# Patient Record
Sex: Male | Born: 1980 | Race: White | Hispanic: No | Marital: Single | Smoking: Never smoker
Health system: Southern US, Community
[De-identification: ages and names within clinical notes are randomized; demographics above are authoritative.]

## PROBLEM LIST (undated history)

## (undated) DIAGNOSIS — F840 Autistic disorder: Secondary | ICD-10-CM

## (undated) DIAGNOSIS — Q909 Down syndrome, unspecified: Secondary | ICD-10-CM

## (undated) DIAGNOSIS — Q213 Tetralogy of Fallot: Secondary | ICD-10-CM

## (undated) HISTORY — PX: TETRALOGY OF FALLOT REPAIR: SHX796

---

## 2017-12-10 ENCOUNTER — Other Ambulatory Visit: Payer: Self-pay

## 2017-12-10 ENCOUNTER — Emergency Department
Admission: EM | Admit: 2017-12-10 | Discharge: 2017-12-10 | Disposition: A | Discharge status: Home / Self Care | Payer: Medicare Other | Attending: Emergency Medicine | Admitting: Emergency Medicine

## 2017-12-10 DIAGNOSIS — R4689 Other symptoms and signs involving appearance and behavior: Secondary | ICD-10-CM | POA: Insufficient documentation

## 2017-12-10 DIAGNOSIS — Z79899 Other long term (current) drug therapy: Secondary | ICD-10-CM | POA: Insufficient documentation

## 2017-12-10 DIAGNOSIS — R109 Unspecified abdominal pain: Secondary | ICD-10-CM

## 2017-12-10 DIAGNOSIS — R1013 Epigastric pain: Secondary | ICD-10-CM | POA: Insufficient documentation

## 2017-12-10 HISTORY — DX: Autistic disorder: F84.0

## 2017-12-10 HISTORY — DX: Down syndrome, unspecified: Q90.9

## 2017-12-10 LAB — COMPREHENSIVE METABOLIC PANEL
ALT: 17 U/L (ref 0–44)
AST: 28 U/L (ref 15–41)
Albumin: 3.9 g/dL (ref 3.5–5.0)
Alkaline Phosphatase: 106 U/L (ref 38–126)
Anion gap: 6 (ref 5–15)
BUN: 13 mg/dL (ref 6–20)
CO2: 26 mmol/L (ref 22–32)
CREATININE: 0.88 mg/dL (ref 0.61–1.24)
Calcium: 9.1 mg/dL (ref 8.9–10.3)
Chloride: 106 mmol/L (ref 98–111)
GFR calc Af Amer: >60 mL/min (ref >60)
Glucose, Bld: 109 mg/dL — ABNORMAL HIGH (ref 70–99)
Potassium: 3.7 mmol/L (ref 3.5–5.1)
Sodium: 138 mmol/L (ref 135–145)
Total Bilirubin: 0.9 mg/dL (ref 0.3–1.2)
Total Protein: 7.3 g/dL (ref 6.5–8.1)

## 2017-12-10 LAB — CBC
HCT: 40.3 % (ref 40.0–52.0)
Hemoglobin: 14.2 g/dL (ref 13.0–18.0)
MCH: 37.2 pg — AB (ref 26.0–34.0)
MCHC: 35.1 g/dL (ref 32.0–36.0)
MCV: 106.1 fL — AB (ref 80.0–100.0)
PLATELETS: 136 10*3/uL — AB (ref 150–440)
RBC: 3.8 MIL/uL — AB (ref 4.40–5.90)
RDW: 15.8 % — ABNORMAL HIGH (ref 11.5–14.5)
WBC: 6.2 10*3/uL (ref 3.8–10.6)

## 2017-12-10 LAB — ETHANOL

## 2017-12-10 LAB — ACETAMINOPHEN LEVEL: Acetaminophen (Tylenol), Serum: <10 ug/mL — ABNORMAL LOW (ref 10–30)

## 2017-12-10 LAB — SALICYLATE LEVEL: Salicylate Lvl: <7 mg/dL (ref 2.8–30.0)

## 2017-12-10 LAB — TROPONIN I: Troponin I: <0.03 ng/mL (ref <0.03)

## 2017-12-10 MED ORDER — HYDROCODONE-ACETAMINOPHEN 5-325 MG PO TABS
2.0000 | ORAL_TABLET | Freq: Once | ORAL | Status: AC
  Administered 2017-12-10: 2 via ORAL
  Filled 2017-12-10: qty 2

## 2017-12-10 MED ORDER — ALUM & MAG HYDROXIDE-SIMETH 200-200-20 MG/5ML PO SUSP
30.0000 mL | Freq: Once | ORAL | Status: AC
  Administered 2017-12-10: 30 mL via ORAL
  Filled 2017-12-10: qty 30
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
## 2017-12-10 NOTE — ED Notes (Signed)
A representative from [facility](Britney) came to check on patient.  Britney also gave medication list for patient.

## 2017-12-10 NOTE — ED Notes (Signed)
Pt. Is from [facility] in [place]
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
## 2017-12-10 NOTE — Discharge Instructions (Signed)
We believe that Derrick Marshall's behavior is likely due to an upset stomach that is making him uncomfortable and causing him to act out.  His workup was reassuring today and we do not recommend any medication changes at this time.  Please give him his regular afternoon / evening medications tonight and follow up with his regular doctor and/or psychiatrist at the next available opportunity.  Return to the emergency department if you develop new or worsening symptoms that concern you.

## 2017-12-10 NOTE — ED Notes (Signed)
Pt. Denies chest pain, pt. States "belly pain".  Pt. Trying to use bathroom to have a bowel movement.
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
## 2017-12-11 ENCOUNTER — Encounter: Payer: Self-pay | Admitting: Emergency Medicine

## 2017-12-11 ENCOUNTER — Other Ambulatory Visit: Payer: Self-pay

## 2017-12-11 ENCOUNTER — Emergency Department
Admission: EM | Admit: 2017-12-11 | Discharge: 2017-12-13 | Disposition: A | Discharge status: Home / Self Care | Payer: Medicare Other | Attending: Emergency Medicine | Admitting: Emergency Medicine

## 2017-12-11 DIAGNOSIS — Z79899 Other long term (current) drug therapy: Secondary | ICD-10-CM | POA: Diagnosis not present

## 2017-12-11 DIAGNOSIS — R451 Restlessness and agitation: Secondary | ICD-10-CM | POA: Diagnosis not present

## 2017-12-11 DIAGNOSIS — R456 Violent behavior: Secondary | ICD-10-CM | POA: Diagnosis present

## 2017-12-11 DIAGNOSIS — R4689 Other symptoms and signs involving appearance and behavior: Secondary | ICD-10-CM

## 2017-12-11 DIAGNOSIS — Q909 Down syndrome, unspecified: Secondary | ICD-10-CM | POA: Insufficient documentation

## 2017-12-11 DIAGNOSIS — F71 Moderate intellectual disabilities: Secondary | ICD-10-CM

## 2017-12-11 DIAGNOSIS — F840 Autistic disorder: Secondary | ICD-10-CM
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
## 2017-12-12 DIAGNOSIS — R451 Restlessness and agitation: Secondary | ICD-10-CM | POA: Diagnosis not present

## 2017-12-12 DIAGNOSIS — F71 Moderate intellectual disabilities: Secondary | ICD-10-CM

## 2017-12-12 DIAGNOSIS — F840 Autistic disorder: Secondary | ICD-10-CM | POA: Diagnosis not present

## 2017-12-12 LAB — COMPREHENSIVE METABOLIC PANEL
ALBUMIN: 3.9 g/dL (ref 3.5–5.0)
ALK PHOS: 111 U/L (ref 38–126)
ALT: 18 U/L (ref 0–44)
AST: 33 U/L (ref 15–41)
Anion gap: 8 (ref 5–15)
BILIRUBIN TOTAL: 0.8 mg/dL (ref 0.3–1.2)
BUN: 12 mg/dL (ref 6–20)
CALCIUM: 9.1 mg/dL (ref 8.9–10.3)
CO2: 28 mmol/L (ref 22–32)
CREATININE: 0.91 mg/dL (ref 0.61–1.24)
Chloride: 104 mmol/L (ref 98–111)
GFR calc Af Amer: >60 mL/min (ref >60)
GFR calc non Af Amer: >60 mL/min (ref >60)
GLUCOSE: 130 mg/dL — AB (ref 70–99)
POTASSIUM: 4 mmol/L (ref 3.5–5.1)
Sodium: 140 mmol/L (ref 135–145)
TOTAL PROTEIN: 7.5 g/dL (ref 6.5–8.1)

## 2017-12-12 LAB — CBC
HCT: 42.1 % (ref 40.0–52.0)
Hemoglobin: 14.4 g/dL (ref 13.0–18.0)
MCH: 36.5 pg — ABNORMAL HIGH (ref 26.0–34.0)
MCHC: 34.3 g/dL (ref 32.0–36.0)
MCV: 106.4 fL — ABNORMAL HIGH (ref 80.0–100.0)
Platelets: 141 10*3/uL — ABNORMAL LOW (ref 150–440)
RBC: 3.96 MIL/uL — ABNORMAL LOW (ref 4.40–5.90)
RDW: 16.3 % — AB (ref 11.5–14.5)
WBC: 3.7 10*3/uL — AB (ref 3.8–10.6)

## 2017-12-12 LAB — ACETAMINOPHEN LEVEL

## 2017-12-12 LAB — ETHANOL: Alcohol, Ethyl (B): <10 mg/dL (ref <10)

## 2017-12-12 LAB — SALICYLATE LEVEL

## 2017-12-12 MED ORDER — VITAMIN B-12 1000 MCG PO TABS
1000.0000 ug | ORAL_TABLET | Freq: Two times a day (BID) | ORAL | Status: DC
  Administered 2017-12-12 – 2017-12-13 (×3): 1000 ug via ORAL
  Filled 2017-12-12 (×3): qty 1

## 2017-12-12 MED ORDER — MICONAZOLE NITRATE POWD
Freq: Two times a day (BID) | Status: DC
  Filled 2017-12-12: qty 100

## 2017-12-12 MED ORDER — HALOPERIDOL 0.5 MG PO TABS
3.0000 mg | ORAL_TABLET | Freq: Two times a day (BID) | ORAL | Status: DC
  Administered 2017-12-12 – 2017-12-13 (×3): 3 mg via ORAL
  Filled 2017-12-12 (×3): qty 6

## 2017-12-12 MED ORDER — BENZTROPINE MESYLATE 1 MG PO TABS
1.0000 mg | ORAL_TABLET | Freq: Two times a day (BID) | ORAL | Status: DC
  Administered 2017-12-12 – 2017-12-13 (×3): 1 mg via ORAL
  Filled 2017-12-12 (×3): qty 1

## 2017-12-12 MED ORDER — NYSTATIN 100000 UNIT/GM EX POWD
Freq: Two times a day (BID) | CUTANEOUS | Status: DC
Start: 2017-12-12 — End: 2017-12-13
  Administered 2017-12-12: 17:00:00 via TOPICAL
  Filled 2017-12-12: qty 15

## 2017-12-12 MED ORDER — FAMOTIDINE 20 MG PO TABS
20.0000 mg | ORAL_TABLET | Freq: Two times a day (BID) | ORAL | Status: DC
  Administered 2017-12-12 – 2017-12-13 (×3): 20 mg via ORAL
  Filled 2017-12-12 (×3): qty 1

## 2017-12-12 MED ORDER — PROSIGHT PO TABS
1.0000 | ORAL_TABLET | Freq: Every day | ORAL | Status: DC
  Filled 2017-12-12: qty 1

## 2017-12-12 MED ORDER — ZIPRASIDONE MESYLATE 20 MG IM SOLR
INTRAMUSCULAR | Status: AC
  Administered 2017-12-12: 10 mg
  Filled 2017-12-12: qty 20

## 2017-12-12 MED ORDER — OCUVITE-LUTEIN PO CAPS
1.0000 | ORAL_CAPSULE | Freq: Every day | ORAL | Status: DC
  Administered 2017-12-12 – 2017-12-13 (×2): 1 via ORAL
  Filled 2017-12-12 (×2): qty 1

## 2017-12-12 MED ORDER — VITAMIN D (ERGOCALCIFEROL) 1.25 MG (50000 UNIT) PO CAPS
50000.0000 [IU] | ORAL_CAPSULE | ORAL | Status: DC
  Administered 2017-12-13: 50000 [IU] via ORAL
  Filled 2017-12-12: qty 1

## 2017-12-12 MED ORDER — HALOPERIDOL LACTATE 5 MG/ML IJ SOLN: 5.0000 mg | Freq: Four times a day (QID) | INTRAMUSCULAR | Status: DC | PRN

## 2017-12-12 MED ORDER — DIAZEPAM 5 MG PO TABS: 5.0000 mg | ORAL_TABLET | Freq: Every day | ORAL | 0 refills | Status: DC

## 2017-12-12 NOTE — ED Notes (Signed)
IVC / Consult completed/ Pending Placement 

## 2017-12-12 NOTE — ED Notes (Signed)
Patient's sister / guardian Bonita Quin(Linda) came to visit.  Ms. Bonita QuinLinda had many requests regarding patient's care and medications. Specifically, Bonita QuinLinda stated patient can not receive ANY new medications or changes in doses without notifying her first. Dr. Toni Amendlapacs speaking with guardian now.   Patient is dayroom, sporadically yelling.  Maintained on 15 minute checks and observation by security camera for safety.

## 2017-12-12 NOTE — ED Notes (Signed)
Hourly rounding reveals patient in room. No complaints, stable, in no acute distress. Q15 minute rounds and monitoring via Security Cameras to continue. 

## 2017-12-12 NOTE — ED Notes (Signed)
Report to include Situation, Background, Assessment, and Recommendations received from Amy B. RN. Patient alert, warm and dry, in no acute distress. Patient difficult to understand and yells out intermittently. Patient made aware of Q15 minute rounds and security cameras for their safety. Patient instructed to come to me with needs or concerns.

## 2017-12-12 NOTE — ED Notes (Signed)
[facility]

## 2017-12-12 NOTE — ED Notes (Signed)
Hourly rounding reveals patient sitting on bed in room. No complaints, stable, in no acute distress. Q15 minute rounds and monitoring via [employer] Cam[employer] to continue.

## 2017-12-12 NOTE — Consult Note (Signed)
  Psychiatry: Updated note.  Since earlier note the group home has agreed to take the patient back and the sister has agreed to the plan.  Both of them requested that we add an additional medicine that the patient can take in the afternoon for anxiety and agitation.  This suggested Valium.  I have written a prescription for Valium 5 mg every afternoon 30 days that can be given to the patient.  I am told that under the circumstances he is fine to go back to the [facility] facility in the morning.  Discontinue IVC.

## 2017-12-12 NOTE — ED Notes (Signed)
Pt in dayroom. Needing frequent re-direction. Maintained on 15 minute checks and observation by security camera for safety.
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
## 2017-12-12 NOTE — ED Notes (Signed)
Hourly rounding reveals patient sleeping in room. No complaints, stable, in no acute distress. Q15 minute rounds and monitoring via Security to continue. 

## 2017-12-12 NOTE — ED Notes (Signed)
Patient was transferred from the quad, Patient is in the dayroom, talks to self and talks loudly at times, but He is not aggressive, but redirectable, and calm, will continue to monitor.
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
## 2017-12-12 NOTE — ED Notes (Signed)
Pt observed on camera smearing poop on the wall in bathroom. Tech and RN approached patient telling him that behavior was not acceptable. Patient began cursing at staff. After a couple of minutes patient stated he was sorry and asked to take a shower. Tech assisted patient with shower.  Patient continually asking to go to the group home.   Maintained on 15 minute checks and observation by security camera for safety.

## 2017-12-12 NOTE — ED Notes (Signed)
Patient has been loud; attempting to take soap disspencer several times;; redirection provided; disruptive behavior continues; ER MD made aware.

## 2017-12-12 NOTE — ED Notes (Addendum)
Patient's guardian Derrick Quin(Linda) called stating the patient can return to his group home if given a prescription for a daily sedative.  Dr. Toni Amendlapacs notified and prescription written for valium.   Legal guardian:  Derrick Marshall [phone])

## 2017-12-12 NOTE — ED Notes (Signed)
Patient allowed Transport plannerwriter and Technician to draw his blood, to collect lab work, patient was appropriate and cooperative. NAD noted
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
## 2017-12-13 DIAGNOSIS — R451 Restlessness and agitation: Secondary | ICD-10-CM | POA: Diagnosis not present

## 2017-12-13 MED ORDER — LOPERAMIDE HCL 2 MG PO CAPS
4.0000 mg | ORAL_CAPSULE | Freq: Once | ORAL | Status: AC
  Administered 2017-12-13: 4 mg via ORAL
  Filled 2017-12-13: qty 2

## 2017-12-13 NOTE — Discharge Instructions (Signed)
He does not have intermittent explosive disorder but the instructions for this problem may help.  Please continue all of his medicines.  Please follow-up with his doctors.  Please return for any further problems.

## 2017-12-13 NOTE — ED Notes (Signed)
Hourly rounding reveals patient sleeping in room. No complaints, stable, in no acute distress. Q15 minute rounds and monitoring via Security Cameras to continue. 

## 2017-12-13 NOTE — ED Notes (Signed)
Spoke with [facility] person(assistant director of [employer]), to let her know patient was ready for discharge, she informed me that the group home staff were having a meeting today at 10a to make sure [facility] a good fit for patient, as he has only been there a little while, and has been to [facility] twice. Mrs. Derrick Marshall said she will call me back to keep me updated after the meeting and she also wanted to make sure the prescription for medication was written.

## 2017-12-13 NOTE — ED Notes (Signed)
Hourly rounding reveals patient in room. No complaints, stable, in no acute distress. Q15 minute rounds and monitoring via Security Cameras to continue. 

## 2017-12-13 NOTE — ED Notes (Addendum)
Patient discharged back to [facility] and was picked up by Patrecia Pourhonda Enoch. Mrs. Anselm LisEnoch signed for the Valium 5mg  prescription, and for the after visit summary. Patient was really glad to see her, and was happy.   Patient discharged to group home, patient received discharge papers and prescription for Valium. Patient received belongings. Patient appropriate and cooperative, Denies SI/HI AVH. Vital signs taken. NAD noted.

## 2017-12-13 NOTE — ED Notes (Addendum)
Attempted to call [facility] to inform them, that patient is ready to be discharged, no one was available [phone]

## 2017-12-13 NOTE — ED Notes (Signed)
BEHAVIORAL HEALTH ROUNDING Patient sleeping: No. Patient alert and oriented: yes Behavior appropriate: Yes.  ; If no, describe:  Nutrition and fluids offered: yes Toileting and hygiene offered: Yes  Sitter present: q15 minute observations and security monitoring Law enforcement present: Yes    

## 2017-12-13 NOTE — Clinical Social Work Note (Signed)
CSW received a consult for "Urgently needs disposition to another, safer and more appropriate site." CSW review notes that patient is able to return to [facility]. CSW confirmed with [facility] RN Geralynn OchsJadeka. CSW faxed patient's prescription to facility. Facility to pick up patient at 12pm. CSW signing off as no further Social Work needs identified.   Corlis HoveJeneya Maxi Carreras, Theresia MajorsLCSWA, [facility] Clinical Social Worker-Emergency Department [phone]

## 2017-12-13 NOTE — ED Notes (Signed)
Hourly rounding reveals patient in rest room. C/O diarrhea, stable, in no acute distress. Q15 minute rounds and monitoring via [employer] to continue.

## 2017-12-13 NOTE — ED Provider Notes (Signed)
-----------------------------------------   4:49 AM on 12/13/2017 -----------------------------------------   Blood pressure 105/66, pulse 91, temperature 98.5 F (36.9 C), temperature source Oral, resp. rate 14, height 5\' 1"  (1.549 m), weight 68 kg, SpO2 97 %.  The patient had no acute events since last update.  Calm and cooperative at this time.  Disposition is pending Psychiatry/Behavioral Medicine team recommendations.     Merrily Brittleifenbark, Simonne Boulos, MD 12/13/17 [phone]

## 2017-12-17 ENCOUNTER — Inpatient Hospital Stay
Admission: EM | Admit: 2017-12-17 | Discharge: 2018-02-02 | DRG: 871 | Disposition: A | Discharge status: Home / Self Care | Payer: Medicare Other | Attending: Internal Medicine | Admitting: Internal Medicine

## 2017-12-17 ENCOUNTER — Encounter: Payer: Self-pay | Admitting: Emergency Medicine

## 2017-12-17 ENCOUNTER — Other Ambulatory Visit: Payer: Self-pay

## 2017-12-17 DIAGNOSIS — J189 Pneumonia, unspecified organism: Secondary | ICD-10-CM

## 2017-12-17 DIAGNOSIS — K5900 Constipation, unspecified: Secondary | ICD-10-CM | POA: Diagnosis not present

## 2017-12-17 DIAGNOSIS — F71 Moderate intellectual disabilities: Secondary | ICD-10-CM | POA: Diagnosis present

## 2017-12-17 DIAGNOSIS — R451 Restlessness and agitation: Secondary | ICD-10-CM | POA: Diagnosis present

## 2017-12-17 DIAGNOSIS — A419 Sepsis, unspecified organism: Principal | ICD-10-CM | POA: Diagnosis present

## 2017-12-17 DIAGNOSIS — J9602 Acute respiratory failure with hypercapnia: Secondary | ICD-10-CM | POA: Diagnosis present

## 2017-12-17 DIAGNOSIS — R0603 Acute respiratory distress: Secondary | ICD-10-CM

## 2017-12-17 DIAGNOSIS — I959 Hypotension, unspecified: Secondary | ICD-10-CM

## 2017-12-17 DIAGNOSIS — R52 Pain, unspecified: Secondary | ICD-10-CM

## 2017-12-17 DIAGNOSIS — F840 Autistic disorder: Secondary | ICD-10-CM | POA: Diagnosis not present

## 2017-12-17 DIAGNOSIS — F72 Severe intellectual disabilities: Secondary | ICD-10-CM | POA: Diagnosis present

## 2017-12-17 DIAGNOSIS — Q909 Down syndrome, unspecified: Secondary | ICD-10-CM

## 2017-12-17 DIAGNOSIS — N170 Acute kidney failure with tubular necrosis: Secondary | ICD-10-CM | POA: Diagnosis not present

## 2017-12-17 DIAGNOSIS — I639 Cerebral infarction, unspecified: Secondary | ICD-10-CM

## 2017-12-17 DIAGNOSIS — Z79899 Other long term (current) drug therapy: Secondary | ICD-10-CM

## 2017-12-17 DIAGNOSIS — R6521 Severe sepsis with septic shock: Secondary | ICD-10-CM | POA: Diagnosis present

## 2017-12-17 DIAGNOSIS — J9601 Acute respiratory failure with hypoxia: Secondary | ICD-10-CM | POA: Diagnosis present

## 2017-12-17 DIAGNOSIS — G9340 Encephalopathy, unspecified: Secondary | ICD-10-CM | POA: Diagnosis present

## 2017-12-17 DIAGNOSIS — J9600 Acute respiratory failure, unspecified whether with hypoxia or hypercapnia: Secondary | ICD-10-CM

## 2017-12-17 DIAGNOSIS — Z888 Allergy status to other drugs, medicaments and biological substances status: Secondary | ICD-10-CM

## 2017-12-17 DIAGNOSIS — R4182 Altered mental status, unspecified: Secondary | ICD-10-CM

## 2017-12-17 DIAGNOSIS — J690 Pneumonitis due to inhalation of food and vomit: Secondary | ICD-10-CM | POA: Diagnosis present

## 2017-12-17 DIAGNOSIS — D696 Thrombocytopenia, unspecified: Secondary | ICD-10-CM | POA: Diagnosis present

## 2017-12-17 DIAGNOSIS — Z23 Encounter for immunization: Secondary | ICD-10-CM

## 2017-12-17 DIAGNOSIS — R739 Hyperglycemia, unspecified: Secondary | ICD-10-CM | POA: Diagnosis present

## 2017-12-17 DIAGNOSIS — H109 Unspecified conjunctivitis: Secondary | ICD-10-CM | POA: Diagnosis not present

## 2017-12-17 DIAGNOSIS — I248 Other forms of acute ischemic heart disease: Secondary | ICD-10-CM | POA: Diagnosis present

## 2017-12-17 DIAGNOSIS — N472 Paraphimosis: Secondary | ICD-10-CM

## 2017-12-17 DIAGNOSIS — R456 Violent behavior: Secondary | ICD-10-CM | POA: Diagnosis not present

## 2017-12-17 DIAGNOSIS — E872 Acidosis: Secondary | ICD-10-CM | POA: Diagnosis present

## 2017-12-17 HISTORY — DX: Tetralogy of Fallot: Q21.3

## 2017-12-17 LAB — CBC
HCT: 39.1 % — ABNORMAL LOW (ref 40.0–52.0)
HEMOGLOBIN: 13.6 g/dL (ref 13.0–18.0)
MCH: 37.1 pg — AB (ref 26.0–34.0)
MCHC: 34.7 g/dL (ref 32.0–36.0)
MCV: 107.1 fL — ABNORMAL HIGH (ref 80.0–100.0)
Platelets: 127 10*3/uL — ABNORMAL LOW (ref 150–440)
RBC: 3.65 MIL/uL — AB (ref 4.40–5.90)
RDW: 15.8 % — ABNORMAL HIGH (ref 11.5–14.5)
WBC: 4.5 10*3/uL (ref 3.8–10.6)

## 2017-12-17 LAB — URINE DRUG SCREEN, QUALITATIVE (ARMC ONLY)
AMPHETAMINES, UR SCREEN: NOT DETECTED
Barbiturates, Ur Screen: NOT DETECTED
COCAINE METABOLITE, UR ~~LOC~~: NOT DETECTED
Cannabinoid 50 Ng, Ur ~~LOC~~: NOT DETECTED
MDMA (Ecstasy)Ur Screen: NOT DETECTED
METHADONE SCREEN, URINE: NOT DETECTED
OPIATE, UR SCREEN: NOT DETECTED
PHENCYCLIDINE (PCP) UR S: NOT DETECTED
Tricyclic, Ur Screen: NOT DETECTED

## 2017-12-17 LAB — COMPREHENSIVE METABOLIC PANEL
ALK PHOS: 106 U/L (ref 38–126)
ALT: 17 U/L (ref 0–44)
AST: 27 U/L (ref 15–41)
Albumin: 3.7 g/dL (ref 3.5–5.0)
Anion gap: 6 (ref 5–15)
BUN: 12 mg/dL (ref 6–20)
CALCIUM: 9 mg/dL (ref 8.9–10.3)
CO2: 26 mmol/L (ref 22–32)
CREATININE: 0.87 mg/dL (ref 0.61–1.24)
Chloride: 106 mmol/L (ref 98–111)
Glucose, Bld: 107 mg/dL — ABNORMAL HIGH (ref 70–99)
Potassium: 3.8 mmol/L (ref 3.5–5.1)
Sodium: 138 mmol/L (ref 135–145)
Total Bilirubin: 0.4 mg/dL (ref 0.3–1.2)
Total Protein: 7 g/dL (ref 6.5–8.1)

## 2017-12-17 LAB — URINALYSIS, ROUTINE W REFLEX MICROSCOPIC
Bilirubin Urine: NEGATIVE
GLUCOSE, UA: NEGATIVE mg/dL
Hgb urine dipstick: NEGATIVE
Ketones, ur: NEGATIVE mg/dL
LEUKOCYTES UA: NEGATIVE
Nitrite: NEGATIVE
PH: 6 (ref 5.0–8.0)
PROTEIN: NEGATIVE mg/dL
Specific Gravity, Urine: 1.011 (ref 1.005–1.030)

## 2017-12-17 LAB — ACETAMINOPHEN LEVEL

## 2017-12-17 LAB — ETHANOL

## 2017-12-17 LAB — SALICYLATE LEVEL: Salicylate Lvl: <7 mg/dL (ref 2.8–30.0)

## 2017-12-17 MED ORDER — FAMOTIDINE 20 MG PO TABS
20.0000 mg | ORAL_TABLET | Freq: Two times a day (BID) | ORAL | Status: DC
  Administered 2017-12-18 – 2018-01-21 (×49): 20 mg via ORAL
  Filled 2017-12-17 (×50): qty 1

## 2017-12-17 MED ORDER — HALOPERIDOL 2 MG PO TABS
3.0000 mg | ORAL_TABLET | Freq: Two times a day (BID) | ORAL | Status: DC
  Administered 2017-12-18 – 2017-12-22 (×10): 3 mg via ORAL
  Filled 2017-12-17 (×13): qty 1

## 2017-12-17 MED ORDER — BENZTROPINE MESYLATE 1 MG PO TABS
1.0000 mg | ORAL_TABLET | Freq: Two times a day (BID) | ORAL | Status: DC
  Administered 2017-12-18 – 2018-01-26 (×77): 1 mg via ORAL
  Filled 2017-12-17 (×79): qty 1

## 2017-12-17 MED ORDER — LORAZEPAM 2 MG/ML IJ SOLN
2.0000 mg | Freq: Once | INTRAMUSCULAR | Status: AC
  Administered 2018-01-04: 2 mg via INTRAMUSCULAR
  Filled 2017-12-17: qty 1

## 2017-12-17 MED ORDER — ZIPRASIDONE MESYLATE 20 MG IM SOLR
INTRAMUSCULAR | Status: AC
  Administered 2017-12-17: 20 mg
  Filled 2017-12-17: qty 20

## 2017-12-17 MED ORDER — DOCUSATE SODIUM 100 MG PO CAPS
100.0000 mg | ORAL_CAPSULE | Freq: Two times a day (BID) | ORAL | Status: DC
  Administered 2017-12-18 – 2018-01-21 (×67): 100 mg via ORAL
  Filled 2017-12-17 (×69): qty 1

## 2017-12-17 MED ORDER — HALOPERIDOL LACTATE 5 MG/ML IJ SOLN
INTRAMUSCULAR | Status: AC
  Administered 2018-01-09: 5 mg via INTRAMUSCULAR
  Filled 2017-12-17: qty 1

## 2017-12-17 MED ORDER — DIAZEPAM 5 MG PO TABS
5.0000 mg | ORAL_TABLET | Freq: Every day | ORAL | Status: DC
  Administered 2017-12-18 – 2017-12-22 (×5): 5 mg via ORAL
  Filled 2017-12-17 (×5): qty 1

## 2017-12-17 MED ORDER — HALOPERIDOL LACTATE 5 MG/ML IJ SOLN
5.0000 mg | Freq: Once | INTRAMUSCULAR | Status: AC
  Administered 2018-01-09: 5 mg via INTRAMUSCULAR
  Filled 2017-12-17: qty 1

## 2017-12-17 MED ORDER — ZIPRASIDONE MESYLATE 20 MG IM SOLR
20.0000 mg | Freq: Once | INTRAMUSCULAR | Status: AC
  Administered 2017-12-21: 20 mg via INTRAMUSCULAR
  Filled 2017-12-17: qty 20

## 2017-12-17 NOTE — BH Assessment (Signed)
Unable to assess, pt asleep at this time.

## 2017-12-17 NOTE — ED Provider Notes (Signed)
[facility] Emergency Department Provider Note  Time seen: 4:07 PM  I have reviewed the triage vital signs and the nursing notes.   HISTORY  Chief Complaint Aggressive Behavior    HPI Derrick Marshall is a 37 y.o. male with a past medical history of Down syndrome, intellectual disability, agitation, presents to the emergency department for violence and agitation.  According to the patient's niece who is his guardian the patient was at [facility] was transferred to [facility], was there ultimately discharged to a group facility 30 days ago.  This is where the patient currently resides however they have been unable to care for the patient given his increasing agitation and aggressive/violent behavior.  They do not believe they can adequately care for the patient I sent him to the emergency department as they believe he needs a higher level of care and placement to a facility more apt to dealing with his intellectual disability and violence.  Upon arrival patient is agitated violent attempts to kick in his spitting.  Patient is in handcuffs by police at this time.  Is unable to provide any history or review of systems.   Past Medical History:  Diagnosis Date  . Autism   . Down syndrome     Patient Active Problem List   Diagnosis Date Noted  . Autistic spectrum disorder 12/12/2017  . 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 Agitation 12/12/2017    History reviewed. No pertinent surgical history.  Prior to Admission medications   Medication Sig Start Date End Date Taking? Authorizing Provider  ammonium lactate (LAC-HYDRIN) 12 % lotion Apply 1 application topically daily. Apply lotion to legs and chest daily after shower    [provider]  benztropine (COGENTIN) 1 MG tablet Take 1 mg by mouth 2 (two) times daily.    [provider]  bisacodyl (DULCOLAX) 10 MG suppository Place 10 mg rectally as needed for moderate constipation.  Insert 1 suppository per rectum if no bowel movements in 48 hours    [provider]  chlorhexidine (HIBICLENS) 4 % external liquid Apply 1 application topically once a week. Shower from head to toe weekly    [provider]  diazepam (VALIUM) 5 MG tablet Take 1 tablet (5 mg total) by mouth daily at 2 PM. 12/12/17   Clapacs, Jackquline Denmark, MD  docusate sodium (COLACE) 100 MG capsule Take 100 mg by mouth 2 (two) times daily.    [provider]  famotidine (PEPCID) 20 MG tablet Take 20 mg by mouth 2 (two) times daily before a meal.    [provider]  haloperidol (HALDOL) 1 MG tablet Take 3 mg by mouth 2 (two) times daily. In the morning and at 1630    [provider]  miconazole (MICOTIN) 2 % powder Apply topically daily. Apply powder to groin, abdomen and buttocks daily after shower    [provider]  Multiple Vitamin (MULTIVITAMIN WITH MINERALS) TABS tablet Take 1 tablet by mouth daily.    [provider]  Skin Protectants, Misc. (EUCERIN) cream Apply 1 application topically as needed for dry skin. Apply to both feet in the morning    [provider]  vitamin B-12 (CYANOCOBALAMIN) 500 MCG tablet Take 1,000 mcg by mouth 2 (two) times daily.    [provider]  Vitamin D, Ergocalciferol, (DRISDOL) 50000 units CAPS capsule Take 50,000 Units by mouth every 7 (seven) days.    [provider]    No Known Allergies  No family history on file.  Social History Social History   Tobacco Use  . Smoking status: Never Smoker  . Smokeless tobacco: Never Used  Substance Use Topics  . Alcohol use: Never    Frequency: Never  . Drug use: Never    Review of Systems Unable to obtain an adequate/accurate review of systems secondary to intellectual disability. ____________________________________________   PHYSICAL EXAM:  Constitutional: Patient is alert, agitated, attempting to kick and spit myself and others. Eyes:  Normal exam ENT   Head: Normocephalic and atraumatic.   Mouth/Throat: Mucous membranes are moist. Cardiovascular: Normal rate, regular rhythm. No murmur Respiratory: Normal respiratory effort without tachypnea nor retractions. Breath sounds are clear  Gastrointestinal: Soft and nontender. No distention.  Musculoskeletal: Nontender with normal range of motion in all extremities.  Moves all extremities well.  Upper extremities in handcuffs but good strength. Neurologic: Patient is agitated aggressive but moving all extremities.  No gross deficit. Skin:  Skin is warm, dry and intact.  Psychiatric: Very agitated at this time.  ____________________________________________  INITIAL IMPRESSION / ASSESSMENT AND PLAN / ED COURSE  Pertinent labs & imaging results that were available during my care of the patient were reviewed by me and considered in my medical decision making (see chart for details).  Patient presents to the emergency department for worsening aggressive/violent behavior at his group facility.  Patient has Down syndrome and intellectual disability.  Attempted to have the patient calm down in the room unsuccessfully continues to try to kick and spit we will use 20 mg of IM Geodon.  We will place the patient under IVC consult tele-psychiatry and TTS.  I will reorder the patient's normal medications.  Niece agreeable to plan of care.  Patient still very agitated, security still involved, will be attempted to remove the handcuffs attempted to de-escalate continues to be agitated.  Patient is Artie received Geodon.  At this time will initiate soft restraints and bilateral ankles and wrist for the patient for personal and staff safety, hopefully very short-term until the medication has had a time to take effect.  ----------------------------------------- 4:37 PM on 12/17/2017 -----------------------------------------   Behavioral Restraint Provider Note:  Behavioral  Indicators: Danger to self, Danger to others and Violent behavior  Reaction to intervention: resisting  Review of systems: No changes  History: History and Physical reviewed and Drugs and Medications reviewed  Mental Status Exam: Remains very agitated.  Not responsive to verbal de-escalation.  Restraint Continuation: Continue  Restraint Rationale Continuation: Remains violent, danger to self and staff.  ----------------------------------------- 6:35 PM on 12/17/2017 -----------------------------------------  Patient is now out of restraints.  Calm and cooperative, eating at this time.  Labs resulted largely at baseline.  Awaiting psychiatric evaluation.  ____________________________________________   FINAL CLINICAL IMPRESSION(S) / ED DIAGNOSES  Violent behavior    Minna AntisPaduchowski, Bernadene Garside, MD 12/17/17 2110

## 2017-12-17 NOTE — ED Notes (Signed)
Pt. Currently sleeping in bed. 

## 2017-12-17 NOTE — ED Notes (Signed)
Pt was calm and able to be released from restraints. Pt given food tray and beverage. VS stable.   Maintained on 15 minute checks and observation by security camera for safety.

## 2017-12-17 NOTE — ED Notes (Signed)
[phone]

## 2017-12-17 NOTE — ED Notes (Signed)
Pt given IM Geodon for aggressive behavior.    Medication did not control patient's aggression. EDP ordered restraints.  Restraints applied. VS obtained.   1:1 sitter at bedside.

## 2017-12-17 NOTE — Progress Notes (Signed)
Marshall introduced myself to patients guardian ( sister) and collected information. This patient had to be restrained and medicated. It was explained to guardian that patient's cardinal care coordinator needs to request higher level of care or [facility] a [facility].  Data collected:  Derrick Marshall [phone] [facility]  Derrick Marshall  [phone] Derrick Marshall [phone]  Marshall was informed [facility] and [facility] and wanted him to referred. Marshall expressed that [facility] innovation Care Coordinator [facility]( [facility]) will need to call on her patients behalf and they would need to increase level of care for patient until a suitable living arrangement can be determined. It was explained that he may have to be supported in the [employer] [facility] with increased staff  Derrick Marshall [phone] is guardian and sister  Derrick Marshall

## 2017-12-17 NOTE — ED Triage Notes (Signed)
Patient presents to the ED in handcuffs [place] [employer] and guardian.  Patient's guardian states that patient is at a new group home and is having difficulty adjusting and his medications are not well adjusted as well.  She states patient has been very aggressive with staff and yelling.  Patient cussing at guardian during triage and spitting.  Patient banging his head hard against the wall.  Guardian states that staff with glasses should be careful because patient will attempt to remove glasses.  Patient has down syndrome.

## 2017-12-18 MED ORDER — HALOPERIDOL 5 MG PO TABS
ORAL_TABLET | ORAL | Status: AC
  Administered 2017-12-18: 5 mg
  Filled 2017-12-18: qty 1

## 2017-12-18 MED ORDER — ALUM & MAG HYDROXIDE-SIMETH 200-200-20 MG/5ML PO SUSP
30.0000 mL | Freq: Once | ORAL | Status: AC
  Administered 2017-12-18: 30 mL via ORAL
  Filled 2017-12-18: qty 30

## 2017-12-18 MED ORDER — OLANZAPINE 5 MG PO TABS
5.0000 mg | ORAL_TABLET | Freq: Three times a day (TID) | ORAL | Status: DC | PRN
  Administered 2017-12-18 – 2017-12-28 (×11): 5 mg via ORAL
  Filled 2017-12-18 (×11): qty 1

## 2017-12-18 MED ORDER — HALOPERIDOL 5 MG PO TABS
ORAL_TABLET | ORAL | Status: AC
  Filled 2017-12-18: qty 1

## 2017-12-18 NOTE — ED Notes (Signed)
Patient belongings removed from room   Camouflage shoe slippers Orange and blue stripe shirt Black and red basketball shorts

## 2017-12-18 NOTE — BH Assessment (Signed)
Assessment Note  Derrick SilversMichael Marshall is an 37 y.o. male. Unable to assess due to limited cognition. Collateral information was gathered from pt's legal guardian Reed Pandy(Linda Montgomery).  Diagnosis: Intellectual Developmental Disability  Past Medical History:  Past Medical History:  Diagnosis Date  . Autism   . Down syndrome     History reviewed. No pertinent surgical history.  Family History: No family history on file.  Social History:  reports that he has never smoked. He has never used smokeless tobacco. He reports that he does not drink alcohol or use drugs.  Additional Social History:  Alcohol / Drug Use Pain Medications: UTA - Unable to Assess Prescriptions: UTA - Unable to Assess Over the Counter: UTA - Unable to Assess History of alcohol / drug use?: No history of alcohol / drug abuse Longest period of sobriety (when/how long): UTA - Unable to Assess  CIWA: CIWA-Ar BP: (pt too agitated) Pulse Rate: 82 COWS:    Allergies: No Known Allergies  Home Medications:  (Not in a hospital admission)  OB/GYN Status:  No LMP for male patient.  General Assessment Data Assessment unable to be completed: Yes Reason for not completing assessment: (Pt not coherent in speech; IDD) Living Arrangements: Group Home Can pt return to current living arrangement?: Yes Admission Status: Involuntary Is patient capable of signing voluntary admission?: No Insurance type: Medicare  Medical Screening Exam [facility]) Medical Exam completed: Yes  Crisis Care Plan Living Arrangements: Group Home Legal Guardian: Other:(Sister)  Education Status Is patient currently in school?: No Is the patient employed, unemployed or receiving disability?: Receiving disability income  Risk to self with the past 6 months Suicidal Ideation: No Has patient been a risk to self within the past 6 months prior to admission? : No Suicidal Intent: No Has patient had any suicidal intent within the past 6  months prior to admission? : No Is patient at risk for suicide?: No Suicidal Plan?: No Has patient had any suicidal plan within the past 6 months prior to admission? : No Access to Means: No What has been your use of drugs/alcohol within the last 12 months?: None Previous Attempts/Gestures: No Triggers for Past Attempts: (UTA - Unable to Assess) Intentional Self Injurious Behavior: (UTA - Unable to Assess) Family Suicide History: Unable to assess Recent stressful life event(s): (UTA - Unable to Assess) Persecutory voices/beliefs?: (UTA - Unable to Assess) Depression: (UTA - Unable to Assess) Depression Symptoms: (UTA - Unable to Assess) Substance abuse history and/or treatment for substance abuse?: (UTA - Unable to Assess) Suicide prevention information given to non-admitted patients: (UTA - Unable to Assess)  Risk to Others within the past 6 months Homicidal Ideation: No Does patient have any lifetime risk of violence toward others beyond the six months prior to admission? : No Thoughts of Harm to Others: No Current Homicidal Intent: No Current Homicidal Plan: No Access to Homicidal Means: No History of harm to others?: No Does patient have access to weapons?: No Criminal Charges Pending?: No Does patient have a court date: No Is patient on probation?: No  Psychosis Hallucinations: (UTA - Unable to Assess) Delusions: (UTA - Unable to Assess)  Mental Status Report Appearance/Hygiene: Unable to Assess Eye Contact: Unable to Assess Motor Activity: Unable to assess Speech: Unable to assess Level of Consciousness: Unable to assess Mood: (UTA - Unable to Assess) Affect: Unable to Assess Anxiety Level: (UTA - Unable to Assess) Thought Processes: Unable to Assess Judgement: Unable to Assess Orientation: Unable to assess Obsessive  Compulsive Thoughts/Behaviors: Unable to Assess  Cognitive Functioning Concentration: Unable to Assess Memory: Unable to Assess Is patient IDD:  Yes Level of Function: Low Is patient DD?: Unknown I IQ score available?: No Insight: Unable to Assess Impulse Control: Unable to Assess Sleep: Unable to Assess Vegetative Symptoms: Unable to Assess  ADLScreening [facility] Assessment Services) Patient's cognitive ability adequate to safely complete daily activities?: No Patient able to express need for assistance with ADLs?: No Independently performs ADLs?: (UTA - Unable to Assess)  Prior Inpatient Therapy Prior Inpatient Therapy: (UTA - Unable to Assess)  Prior Outpatient Therapy Prior Outpatient Therapy: (UTA - Unable to Assess)  ADL Screening (condition at time of admission) Patient's cognitive ability adequate to safely complete daily activities?: No Patient able to express need for assistance with ADLs?: No Independently performs ADLs?: (UTA - Unable to Assess)       Abuse/Neglect Assessment (Assessment to be complete while patient is alone) Abuse/Neglect Assessment Can Be Completed: Unable to assess, patient is non-responsive or altered mental status     Advance Directives (For Healthcare) Does Patient Have a Medical Advance Directive?: No Would patient like information on creating a medical advance directive?: No - Patient declined          Disposition:  Disposition Initial Assessment Completed for this Encounter: Yes Disposition of Patient: Admit Type of inpatient treatment program: Adult Patient refused recommended treatment: No Mode of transportation if patient is discharged?: Other Patient referred to: Other (Comment)  On Site Evaluation by:   Reviewed with Physician:    Wilmon Arms 12/18/2017 5:07 PM

## 2017-12-18 NOTE — ED Notes (Signed)
Pt given meal tray.

## 2017-12-18 NOTE — ED Notes (Signed)
Patient visiting with sister  

## 2017-12-18 NOTE — ED Notes (Signed)
Called for SOC consult [phone] 

## 2017-12-18 NOTE — ED Notes (Signed)
[facility] and [facility]

## 2017-12-18 NOTE — ED Notes (Signed)
Pt. Got up to use bathroom.  Pt. Returned to room with steady gait.  This Clinical research associatewriter talked to patient and explained to him where he was and what to expect.   Patient receptive and satisfied with answers.

## 2017-12-18 NOTE — ED Notes (Addendum)
Patient left patients room tearful, she feels that [facility] was not listening to her and the doctor is going to recommend psychiatric inpatient and sister feels that, that is not the way to "handle patient", she feels [facility] needs to [facility] or a home that deals with his behaviors but not a psychiatric hospital

## 2017-12-18 NOTE — ED Notes (Signed)
Pt given lunch tray. Pt looked inside, stood up, and threw tray into trash can.

## 2017-12-18 NOTE — ED Notes (Signed)
Hourly rounding reveals patient sleeping in room. No complaints, stable, in no acute distress. Q15 minute rounds and monitoring via Security to continue. 

## 2017-12-18 NOTE — BH Assessment (Signed)
Writer attempted to contact pt's guardian Reed Pandy(Linda Montgomery [phone]). Writer left hippa compliant vm

## 2017-12-19 DIAGNOSIS — F840 Autistic disorder: Secondary | ICD-10-CM | POA: Diagnosis not present

## 2017-12-19 NOTE — ED Notes (Signed)
Patient asked for hot dogs, ordered patient a hot dog from dining services

## 2017-12-19 NOTE — ED Provider Notes (Signed)
-----------------------------------------   7:35 AM on 12/19/2017 -----------------------------------------   BP 111/75 (BP Location: Left Arm)   Pulse 68   Temp 98.2 F (36.8 C) (Oral)   Resp 18   Ht 5\' 1"  (1.549 m)   Wt 68 kg   SpO2 100%   BMI 28.33 kg/m   Disposition is pending per Psychiatry/Behavioral Medicine team recommendations.     Phineas SemenGoodman, Julianah Marciel, MD 12/19/17 [phone]

## 2017-12-19 NOTE — Consult Note (Signed)
[facility] Consult   Reason for Consult: Consult for this 37 year old man with severe behavior problems in the context of developmental disability and autistic spectrum disorder Referring Physician: Archie Balboa Patient Identification: Derrick Marshall MRN:  [id number] Principal Diagnosis: Autistic spectrum disorder Diagnosis:   Patient Active Problem List   Diagnosis Date Noted  . Autistic spectrum disorder [F84.0] 12/12/2017  . 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 Agitation [R45.1] 12/12/2017    Total Time spent with patient: 1 hour  Subjective:   Derrick Marshall is a 37 y.o. male patient admitted with patient not able to give any information.Marland Kitchen  HPI: Patient seen chart reviewed.  Patient known from previous encounter.  This is a 37 year old man with a history of trisomy 21 accompanied by at least moderate degree developmental disability and autistic spectrum disorder.  Patient was seen here in the emergency room recently and was discharged back to his group home but returns now with more agitated violent behavior.  Patient not able to give any account of himself.  Tends to repeat phrases constantly has very poor communication skills.  Has been agitated but ultimately controllable with medication here in the emergency room.  We are told that he is not able to return to the same facility.  Social history: His sister is his legal guardian and has already been to visit him.  Patient had been at [facility] for a lengthy stay and only recently was transitioned to [facility] where it is not working out.  Medical history: Trisomy 21 appears to have some abnormal physical development but no specific illness outside of the mental health and developmental  Substance abuse history: None  Past Psychiatric History: Long history of behavior problems with repeated episodes of violence property disc direction and aggression related to autistic spectrum disorder.  It sounds  like during his lengthy stay at [facility] they were able to get his behavior under enough control that they felt comfortable with his going to [facility].  He was here in the emergency room recently we ultimately reached a negotiated agreement that if we gave him a prescription for some Valium to take in the afternoon the group home would try to take him back but this seems not to work.    Risk to Self: Suicidal Ideation: No Suicidal Intent: No Is patient at risk for suicide?: No Suicidal Plan?: No Access to Means: No What has been your use of drugs/alcohol within the last 12 months?: None Triggers for Past Attempts: (UTA - Unable to Assess) Intentional Self Injurious Behavior: (UTA - Unable to Assess) Risk to Others: Homicidal Ideation: No Thoughts of Harm to Others: No Current Homicidal Intent: No Current Homicidal Plan: No Access to Homicidal Means: No History of harm to others?: No Does patient have access to weapons?: No Criminal Charges Pending?: No Does patient have a court date: No Prior Inpatient Therapy: Prior Inpatient Therapy: (UTA - Unable to Assess) Prior Outpatient Therapy: Prior Outpatient Therapy: (UTA - Unable to Assess)  Past Medical History:  Past Medical History:  Diagnosis Date  . Autism   . Down syndrome    History reviewed. No pertinent surgical history. Family History: No family history on file. Family Psychiatric  History: Nothing known as the patient is adopted Social History:  Social History   Substance and Sexual Activity  Alcohol Use Never  . Frequency: Never     Social History   Substance and Sexual Activity  Drug Use Never    Social History  Socioeconomic History  . Marital status: Single    Spouse name: Not on file  . Number of children: Not on file  . Years of education: Not on file  . Highest education level: Not on file  Occupational History  . Not on file  Social Needs  . Financial resource strain: Not on file  . Food  insecurity:    Worry: Not on file    Inability: Not on file  . Transportation needs:    Medical: Not on file    Non-medical: Not on file  Tobacco Use  . Smoking status: Never Smoker  . Smokeless tobacco: Never Used  Substance and Sexual Activity  . Alcohol use: Never    Frequency: Never  . Drug use: Never  . Sexual activity: Not on file  Lifestyle  . Physical activity:    Days per week: Not on file    Minutes per session: Not on file  . Stress: Not on file  Relationships  . Social connections:    Talks on phone: Not on file    Gets together: Not on file    Attends religious service: Not on file    Active member of club or organization: Not on file    Attends meetings of clubs or organizations: Not on file    Relationship status: Not on file  Other Topics Concern  . Not on file  Social History Narrative  . Not on file   Additional Social History:    Allergies:  No Known Allergies  Labs:  Results for orders placed or performed during the hospital encounter of 12/17/17 (from the past 48 hour(s))  CBC     Status: Abnormal   Collection Time: 12/17/17  6:08 PM  Result Value Ref Range   WBC 4.5 3.8 - 10.6 K/uL   RBC 3.65 (L) 4.40 - 5.90 MIL/uL   Hemoglobin 13.6 13.0 - 18.0 g/dL   HCT 39.1 (L) 40.0 - 52.0 %   MCV 107.1 (H) 80.0 - 100.0 fL   MCH 37.1 (H) 26.0 - 34.0 pg   MCHC 34.7 32.0 - 36.0 g/dL   RDW 15.8 (H) 11.5 - 14.5 %   Platelets 127 (L) 150 - 440 K/uL    Comment: Performed at [facility], [address] [place], [place] [address]  Comprehensive metabolic panel     Status: Abnormal   Collection Time: 12/17/17  6:08 PM  Result Value Ref Range   Sodium 138 135 - 145 mmol/L   Potassium 3.8 3.5 - 5.1 mmol/L   Chloride 106 98 - 111 mmol/L   CO2 26 22 - 32 mmol/L   Glucose, Bld 107 (H) 70 - 99 mg/dL   BUN 12 6 - 20 mg/dL   Creatinine, Ser 0.87 0.61 - 1.24 mg/dL   Calcium 9.0 8.9 - 10.3 mg/dL   Total Protein 7.0 6.5 - 8.1 g/dL   Albumin 3.7 3.5 -  5.0 g/dL   AST 27 15 - 41 U/L   ALT 17 0 - 44 U/L   Alkaline Phosphatase 106 38 - 126 U/L   Total Bilirubin 0.4 0.3 - 1.2 mg/dL   GFR calc non Af Amer >60 >60 mL/min   GFR calc Af Amer >60 >60 mL/min    Comment: (NOTE) The eGFR has been calculated using the CKD EPI equation. This calculation has not been validated in all clinical situations. eGFR's persistently <60 mL/min signify possible Chronic Kidney Disease.    Anion gap 6 5 -  15    Comment: Performed at [facility], [address] [place], [place] [address]  Ethanol     Status: None   Collection Time: 12/17/17  6:08 PM  Result Value Ref Range   Alcohol, Ethyl (B) <10 <10 mg/dL    Comment: (NOTE) Lowest detectable limit for serum alcohol is 10 mg/dL. For medical purposes only. Performed at [facility], [address] [place], [place] [address]   Acetaminophen level     Status: Abnormal   Collection Time: 12/17/17  6:08 PM  Result Value Ref Range   Acetaminophen (Tylenol), Serum <10 (L) 10 - 30 ug/mL    Comment: (NOTE) Therapeutic concentrations vary significantly. A range of 10-30 ug/mL  may be an effective concentration for many patients. However, some  are best treated at concentrations outside of this range. Acetaminophen concentrations >150 ug/mL at 4 hours after ingestion  and >50 ug/mL at 12 hours after ingestion are often associated with  toxic reactions. Performed at [facility], [address] [place], [place] [address]   Salicylate level     Status: None   Collection Time: 12/17/17  6:08 PM  Result Value Ref Range   Salicylate Lvl <1.6 2.8 - 30.0 mg/dL    Comment: Performed at [facility], [address] [place], [place] [address]  Urine Drug Screen, Qualitative ([place] only)     Status: Abnormal   Collection Time: 12/17/17  6:51 PM  Result Value Ref Range   Tricyclic, Ur Screen NONE DETECTED NONE DETECTED   Amphetamines, Ur Screen NONE DETECTED NONE DETECTED    MDMA (Ecstasy)Ur Screen NONE DETECTED NONE DETECTED   Cocaine Metabolite,Ur [place] NONE DETECTED NONE DETECTED   Opiate, Ur Screen NONE DETECTED NONE DETECTED   Phencyclidine (PCP) Ur S NONE DETECTED NONE DETECTED   Cannabinoid 50 Ng, Ur [place] NONE DETECTED NONE DETECTED   Barbiturates, Ur Screen NONE DETECTED NONE DETECTED   Benzodiazepine, Ur Scrn TEST NOT PERFORMED, REAGENT NOT AVAILABLE (A) NONE DETECTED   Methadone Scn, Ur NONE DETECTED NONE DETECTED    Comment: (NOTE) Tricyclics + metabolites, urine    Cutoff 1000 ng/mL Amphetamines + metabolites, urine  Cutoff 1000 ng/mL MDMA (Ecstasy), urine              Cutoff 500 ng/mL Cocaine Metabolite, urine          Cutoff 300 ng/mL Opiate + metabolites, urine        Cutoff 300 ng/mL Phencyclidine (PCP), urine         Cutoff 25 ng/mL Cannabinoid, urine                 Cutoff 50 ng/mL Barbiturates + metabolites, urine  Cutoff 200 ng/mL Benzodiazepine, urine              Cutoff 200 ng/mL Methadone, urine                   Cutoff 300 ng/mL The urine drug screen provides only a preliminary, unconfirmed analytical test result and should not be used for non-medical purposes. Clinical consideration and professional judgment should be applied to any positive drug screen result due to possible interfering substances. A more specific alternate chemical method must be used in order to obtain a confirmed analytical result. Gas chromatography / mass spectrometry (GC/MS) is the preferred confirmat ory method. Performed at [facility], [address] [place], [place] [address]   Urinalysis, Routine w reflex microscopic  Status: Abnormal   Collection Time: 12/17/17  6:51 PM  Result Value Ref Range   Color, Urine YELLOW (A) YELLOW   APPearance CLEAR (A) CLEAR   Specific Gravity, Urine 1.011 1.005 - 1.030   pH 6.0 5.0 - 8.0   Glucose, UA NEGATIVE NEGATIVE mg/dL   Hgb urine dipstick NEGATIVE NEGATIVE   Bilirubin Urine NEGATIVE NEGATIVE    Ketones, ur NEGATIVE NEGATIVE mg/dL   Protein, ur NEGATIVE NEGATIVE mg/dL   Nitrite NEGATIVE NEGATIVE   Leukocytes, UA NEGATIVE NEGATIVE    Comment: Performed at [facility], [address] [place], [place] [address]    Current Facility-Administered Medications  Medication Dose Route Frequency Provider Last Rate Last Dose  . benztropine (COGENTIN) tablet 1 mg  1 mg Oral BID Harvest Dark, MD   1 mg at 12/19/17 1023  . diazepam (VALIUM) tablet 5 mg  5 mg Oral Q1400 Harvest Dark, MD   5 mg at 12/18/17 1418  . docusate sodium (COLACE) capsule 100 mg  100 mg Oral BID Harvest Dark, MD   100 mg at 12/19/17 1027  . famotidine (PEPCID) tablet 20 mg  20 mg Oral BID AC Harvest Dark, MD   20 mg at 12/19/17 1027  . haloperidol (HALDOL) tablet 3 mg  3 mg Oral BID Harvest Dark, MD   3 mg at 12/19/17 1028  . haloperidol lactate (HALDOL) injection 5 mg  5 mg Intramuscular Once Harvest Dark, MD   Stopped at 12/17/17 1906  . LORazepam (ATIVAN) injection 2 mg  2 mg Intramuscular Once Harvest Dark, MD   Stopped at 12/17/17 1825  . OLANZapine (ZYPREXA) tablet 5 mg  5 mg Oral Q8H PRN Merlyn Lot, MD   5 mg at 12/19/17 1027  . ziprasidone (GEODON) injection 20 mg  20 mg Intramuscular Once Harvest Dark, MD       Current Outpatient Medications  Medication Sig Dispense Refill  . benztropine (COGENTIN) 1 MG tablet Take 1 mg by mouth 2 (two) times daily.    . bisacodyl (DULCOLAX) 10 MG suppository Place 10 mg rectally as needed for moderate constipation. Insert 1 suppository per rectum if no bowel movements in 48 hours    . chlorhexidine (HIBICLENS) 4 % external liquid Apply 1 application topically once a week. Shower from head to toe weekly    . diazepam (VALIUM) 5 MG tablet Take 1 tablet (5 mg total) by mouth daily at 2 PM. 30 tablet 0  . docusate sodium (COLACE) 100 MG capsule Take 100 mg by mouth 2 (two) times daily.    . famotidine (PEPCID) 20 MG  tablet Take 20 mg by mouth 2 (two) times daily before a meal.    . haloperidol (HALDOL) 1 MG tablet Take 3 mg by mouth 2 (two) times daily. In the morning and at 1630    . miconazole (MICOTIN) 2 % powder Apply topically daily. Apply powder to groin, abdomen and buttocks daily after shower    . Miconazole Nitrate (ALOE VESTA ANTIFUNGAL) 2 % OINT Apply 1 application topically 2 (two) times daily as needed (itching). Apply to groin area    . Multiple Vitamin (MULTIVITAMIN WITH MINERALS) TABS tablet Take 1 tablet by mouth daily.    . Skin Protectants, Misc. (EUCERIN) cream Apply 1 application topically daily.     . vitamin B-12 (CYANOCOBALAMIN) 500 MCG tablet Take 1,000 mcg by mouth 2 (two) times daily.    . Vitamin D, Ergocalciferol, (DRISDOL) 50000 units CAPS capsule Take 50,000 Units  by mouth every 7 (seven) days.    Marland Kitchen ammonium lactate (LAC-HYDRIN) 12 % lotion Apply 1 application topically daily. Apply lotion to legs and chest daily after shower      Musculoskeletal: Strength & Muscle Tone: decreased Gait & Station: broad based Patient leans: N/A  Psychiatric Specialty Exam: Physical Exam  Nursing note and vitals reviewed. Constitutional: He appears well-developed and well-nourished.  HENT:  Head: Normocephalic and atraumatic.  Eyes: Pupils are equal, round, and reactive to light. Conjunctivae are normal.  Neck: Normal range of motion.  Cardiovascular: Regular rhythm and normal heart sounds.  Respiratory: Effort normal. No respiratory distress.  GI: Soft.  Musculoskeletal: Normal range of motion.  Neurological: He is alert.  Skin: Skin is warm and dry.  Psychiatric: His affect is blunt and inappropriate. His speech is tangential and slurred. He is agitated. He is not aggressive. Cognition and memory are impaired. He expresses impulsivity and inappropriate judgment. He expresses no homicidal and no suicidal ideation. He is noncommunicative. He exhibits abnormal recent memory and abnormal  remote memory.    Review of Systems  Unable to perform ROS: Mental acuity    Blood pressure 111/75, pulse 68, temperature 98.2 F (36.8 C), temperature source Oral, resp. rate 18, height 5' 1"  (1.549 m), weight 68 kg, SpO2 100 %.Body mass index is 28.33 kg/m.  General Appearance: Disheveled  Eye Contact:  Minimal  Speech:  Garbled and Slurred  Volume:  Decreased  Mood:  Euthymic  Affect:  Inappropriate and Labile  Thought Process:  Disorganized  Orientation:  Negative  Thought Content:  Negative  Suicidal Thoughts:  No  Homicidal Thoughts:  No  Memory:  Immediate;   Poor Recent;   Poor Remote;   Poor  Judgement:  Poor  Insight:  Lacking  Psychomotor Activity:  Restlessness  Concentration:  Concentration: Poor  Recall:  Poor  Fund of Knowledge:  Poor  Language:  Poor  Akathisia:  No  Handed:  Right  AIMS (if indicated):     Assets:  Social Support  ADL's:  Impaired  Cognition:  Impaired,  Moderate  Sleep:        Treatment Plan Summary: Daily contact with patient to assess and evaluate symptoms and progress in treatment, Medication management and Plan Patient now back in the emergency room because of violent behavior at the group home [facility].  He will not be allowed to go back to that living arrangement.  It appears that he is really not ready for that kind of an arrangement.  He also however does not meet the criteria for admission to our psychiatric unit as his primary problem is developmental disability and autistic spectrum disorder.  Case reviewed with TTS and emergency room nursing and physician.  And social work.  What the patient really needs is to either go back to [facility] or have a much higher level of care such as having 1-1 or even 2 to one staffing for his behavior.  [facility] really needs to take the lead in getting the patient the treatment that he requires.  Continue outpatient medicine for now.  Work with [facility] on getting  appropriate disposition.  Disposition: Patient does not meet criteria for psychiatric inpatient admission.  Alethia Berthold, MD 12/19/2017 4:48 PM

## 2017-12-19 NOTE — BH Assessment (Signed)
This writer went to complete reassessment. Pt sleeping deeply and could not be aroused. This writer will return in a few hours.  

## 2017-12-19 NOTE — ED Notes (Signed)
Hourly rounding reveals patient sleeping in room. No complaints, stable, in no acute distress. Q15 minute rounds and monitoring via Security to continue. 

## 2017-12-19 NOTE — ED Notes (Signed)
Spoke to patient sister Bonita QuinLinda, she was calling to check on patient. Mrs. Bonita QuinLinda stated she has been speaking with [facility] to see if patient could be placed closer, she has been thinking about [facility], so that she may have access to patient. Sister also stated that [facility] may take him back if he could be on a stronger type of medication than Valium 5mg . She has a meeting with [facility].

## 2017-12-20 NOTE — ED Notes (Signed)
Pt continuing to say he wants to go to the group home.  Pt can be re-directed. Mood is pleasant.   Maintained on 15 minute checks and observation by security camera for safety.

## 2017-12-20 NOTE — ED Notes (Signed)
Pt brought 2 hot dogs per his request.

## 2017-12-20 NOTE — ED Notes (Signed)
Patient's sister Bonita QuinLinda called to check on patient. RN informed Bonita QuinLinda he had a good night and was happy and cooperative this morning.

## 2017-12-20 NOTE — ED Notes (Signed)
Pt given breakfast tray

## 2017-12-20 NOTE — ED Provider Notes (Signed)
-----------------------------------------   7:26 AM on 12/20/2017 -----------------------------------------   Blood pressure 111/75, pulse 68, temperature 98.2 F (36.8 C), temperature source Oral, resp. rate 18, height 5\' 1"  (1.549 m), weight 68 kg, SpO2 100 %.  The patient had no acute events since last update.  Calm and cooperative at this time.  The patient is awaiting social work placement.     Rebecka ApleyWebster, Allison P, MD 12/20/17 [phone]

## 2017-12-20 NOTE — ED Notes (Signed)
IV[facility]/  PENDING  PLACEMENT 

## 2017-12-20 NOTE — ED Notes (Signed)
Pt refused evening medication, told RN to "go away."    Maintained on 15 minute checks and observation by security camera for safety.

## 2017-12-21 ENCOUNTER — Emergency Department: Payer: Medicare Other

## 2017-12-21 LAB — CBC WITH DIFFERENTIAL/PLATELET
Basophils Absolute: 0 10*3/uL (ref 0–0.1)
Basophils Relative: 0 %
EOS ABS: 0 10*3/uL (ref 0–0.7)
Eosinophils Relative: 1 %
HCT: 40.9 % (ref 40.0–52.0)
Hemoglobin: 14.1 g/dL (ref 13.0–18.0)
LYMPHS ABS: 1.7 10*3/uL (ref 1.0–3.6)
LYMPHS PCT: 44 %
MCH: 36.5 pg — AB (ref 26.0–34.0)
MCHC: 34.4 g/dL (ref 32.0–36.0)
MCV: 106 fL — AB (ref 80.0–100.0)
MONO ABS: 0.3 10*3/uL (ref 0.2–1.0)
Monocytes Relative: 8 %
Neutro Abs: 1.8 10*3/uL (ref 1.4–6.5)
Neutrophils Relative %: 47 %
PLATELETS: 142 10*3/uL — AB (ref 150–440)
RBC: 3.86 MIL/uL — ABNORMAL LOW (ref 4.40–5.90)
RDW: 16.2 % — AB (ref 11.5–14.5)
WBC: 3.9 10*3/uL (ref 3.8–10.6)

## 2017-12-21 LAB — COMPREHENSIVE METABOLIC PANEL
ALK PHOS: 110 U/L (ref 38–126)
ALT: 26 U/L (ref 0–44)
AST: 34 U/L (ref 15–41)
Albumin: 3.8 g/dL (ref 3.5–5.0)
Anion gap: 5 (ref 5–15)
BUN: 17 mg/dL (ref 6–20)
CALCIUM: 8.8 mg/dL — AB (ref 8.9–10.3)
CHLORIDE: 107 mmol/L (ref 98–111)
CO2: 29 mmol/L (ref 22–32)
Creatinine, Ser: 0.76 mg/dL (ref 0.61–1.24)
Glucose, Bld: 84 mg/dL (ref 70–99)
Potassium: 3.8 mmol/L (ref 3.5–5.1)
SODIUM: 141 mmol/L (ref 135–145)
Total Bilirubin: 0.3 mg/dL (ref 0.3–1.2)
Total Protein: 7.2 g/dL (ref 6.5–8.1)

## 2017-12-21 LAB — LIPASE, BLOOD: Lipase: 22 U/L (ref 11–51)

## 2017-12-21 MED ORDER — OXYCODONE-ACETAMINOPHEN 5-325 MG PO TABS
1.0000 | ORAL_TABLET | Freq: Once | ORAL | Status: AC
  Administered 2017-12-21: 1 via ORAL
  Filled 2017-12-21: qty 1

## 2017-12-21 MED ORDER — BISMUTH SUBSALICYLATE 262 MG/15ML PO SUSP
30.0000 mL | ORAL | Status: DC | PRN
  Administered 2017-12-21 – 2018-01-17 (×15): 30 mL via ORAL
  Filled 2017-12-21 (×4): qty 118

## 2017-12-21 MED ORDER — LORAZEPAM 2 MG/ML IJ SOLN: 1.0000 mg | Freq: Once | INTRAMUSCULAR | Status: DC

## 2017-12-21 MED ORDER — HALOPERIDOL LACTATE 5 MG/ML IJ SOLN: 5.0000 mg | Freq: Once | INTRAMUSCULAR | Status: DC

## 2017-12-21 NOTE — ED Notes (Signed)
Patient continues to yell, "I want to go home!"

## 2017-12-21 NOTE — ED Notes (Signed)
Pt sleeping. Breakfast tray placed at pt bedside.

## 2017-12-21 NOTE — ED Provider Notes (Signed)
Vitals:   12/17/17 1825 12/18/17 2125  BP:  111/75  Pulse: 82 68  Resp: 18 18  Temp:  98.2 F (36.8 C)  SpO2:  100%   Patient remains medically stable for psychiatric evaluation and disposition   Emily FilbertWilliams, My Rinke E, MD 12/21/17 [phone]

## 2017-12-21 NOTE — ED Notes (Signed)
Pt awake and eating breakfast.

## 2017-12-21 NOTE — ED Provider Notes (Signed)
Patient's lab work and x-rays look okay except for a lot of stool.  Patient is probably having constipation.  I will continue the stool softener.  I will stop the Pepto-Bismol.   Arnaldo NatalMalinda, Denni France F, MD 12/21/17 2235

## 2017-12-21 NOTE — ED Notes (Signed)
Patient eating dinner (2 hot dogs with mayonnaise and a sugar cookie)

## 2017-12-21 NOTE — ED Notes (Signed)
Pt resting peacefully at this time.

## 2017-12-21 NOTE — ED Notes (Signed)
Patient showered and brushed his teeth with assistance from Transport plannerwriter and technician and has fresh linen on bed

## 2017-12-21 NOTE — ED Notes (Signed)
Patient is yelling out in his room, and he came over to Clinical research associatewriter and attempted to Radiographer, therapeutichug writer and give me a kiss, when redirected patient and said we can give "fist bumps" he gave Clinical research associatewriter a fist bump and then said "lets dance". Patient in a playful mood, listening to country music on his television and singing.  Patient also continues to say "I want to go home, to group home and see Bonita QuinLinda"

## 2017-12-21 NOTE — ED Notes (Signed)
BEHAVIORAL HEALTH ROUNDING Patient sleeping: No. Patient alert and oriented: yes Behavior appropriate: Yes.  ; If no, describe:  Nutrition and fluids offered: yes Toileting and hygiene offered: Yes  Sitter present: q15 minute observations and security monitoring Law enforcement present: Yes    

## 2017-12-21 NOTE — ED Notes (Addendum)
Patient in room repeating "I want to go home!"  Patient is irritable but cooperative.

## 2017-12-21 NOTE — ED Notes (Signed)
Patient in room yelling out room talking to Clinical research associatewriter and called Clinical research associatewriter "cunt" and "bitch" while writer was sitting in hall. Patient then immediately apologized and said "not nice", "that's innapprpriate and rude".  Patient continues to apologize, writer told him I accept his apology, but that's not nice. Patient is becoming preoccupied and anxious about leaving. He says call group home or Bonita QuinLinda and he says "come on now, hurry up"  Told patient we are waiting for group home to call us and he says "alright"

## 2017-12-21 NOTE — Clinical Social Work Note (Addendum)
CSW staffed with Derrick Marshall. CSW spoke with patient's sister/legal guardian-Derrick Marshall [phone]), who stated she has a meeting today with Derrick Marshall and Derrick Marshall. Ms. Ericka Marshall to call back and update with facility decision. Plan is return to group [facility] with adjusted medication vs. [facility]. CSW provided Ms. Montgomery with CSW contact information. CSW staffed and updated psychiatrist Derrick Marshall. CSW continuing to follow for discharge needs.  Derrick Marshall, Theresia MajorsLCSWA, [facility] Clinical Social Worker-Emergency Department [phone]

## 2017-12-21 NOTE — ED Provider Notes (Addendum)
he is complaining of diarrhea and as I understand it crampy abdominal pain.  We will give him some Pepto-Bismol and see how that does.   Arnaldo NatalMalinda, Paul F, MD 12/21/17 1757    Arnaldo NatalMalinda, Paul F, MD 12/21/17 2235

## 2017-12-21 NOTE — ED Notes (Signed)
Patient complaining of "tummy hurts", he keeps going in and out of bathroom. When tried to go in bathroom with patient he tells me "to go away"

## 2017-12-21 NOTE — Clinical Social Work Note (Signed)
CSW received phone call from patient's sister Reed PandyLinda Montgomery [phone] who is patient's legal guardian, she would like to speak with psychiatrist to discuss med changes.  Patient's sister also stated she would like the hospital to speak with Lupita Leashonna [facility] [phone], because she needs to assess patient to determine if they can accept patient or not.  CSW to continue to follow patient's progress throughout discharge planning.  Ervin KnackEric R. Hassan Rowannterhaus, MSW, Theresia MajorsLCSWA [phone]  12/21/2017 5:49 PM

## 2017-12-21 NOTE — ED Notes (Signed)
Patient eating chocolate ice cream

## 2017-12-21 NOTE — ED Notes (Signed)
Hourly rounding reveals patient sleeping in room. No complaints, stable, in no acute distress. Q15 minute rounds and monitoring via Security to continue. 

## 2017-12-21 NOTE — Clinical Social Work Note (Signed)
CSW received a phone call from Modena SlaterJill McKinney the ID [facility] specialist from [facility], [phone], and she said she had a meeting with [facility], and the legal [facility], [facility] stated they will not confirm they will accept patient until [facility] nurse evaluates patient.  According to Modena SlaterJill McKinney, [facility], would like to see if there are any med changes that can be done.  CSW informed her that psychiatry has not seen patient yet today, and CSW will update her once psychiatry sees patient again.  Modena SlaterJill McKinney, said she is going to continue to look for other placement options for patient.  CSW to continue to follow patient's progress throughout discharge planning.  Ervin KnackEric R. Kamri Gotsch, MSW, Theresia MajorsLCSWA [phone]  12/21/2017 5:00 PM

## 2017-12-21 NOTE — ED Notes (Signed)
Medical doctor is in seeing patient

## 2017-12-21 NOTE — ED Notes (Signed)
Patient given 30ml Pepto Bysmol and said "that helps" thank goodness

## 2017-12-22 DIAGNOSIS — F840 Autistic disorder: Secondary | ICD-10-CM | POA: Diagnosis not present

## 2017-12-22 MED ORDER — HALOPERIDOL 5 MG PO TABS
5.0000 mg | ORAL_TABLET | Freq: Two times a day (BID) | ORAL | Status: DC
  Administered 2017-12-22 – 2018-01-17 (×49): 5 mg via ORAL
  Filled 2017-12-22 (×50): qty 1

## 2017-12-22 MED ORDER — POLYETHYLENE GLYCOL 3350 17 G PO PACK
PACK | ORAL | Status: AC
  Administered 2017-12-22: 17 g
  Filled 2017-12-22: qty 1

## 2017-12-22 MED ORDER — DIAZEPAM 5 MG PO TABS
10.0000 mg | ORAL_TABLET | Freq: Three times a day (TID) | ORAL | Status: DC | PRN
  Administered 2017-12-22 – 2018-02-01 (×29): 10 mg via ORAL
  Filled 2017-12-22 (×30): qty 2

## 2017-12-22 MED ORDER — DIAZEPAM 5 MG PO TABS
10.0000 mg | ORAL_TABLET | Freq: Every day | ORAL | Status: DC
  Administered 2017-12-23 – 2018-01-18 (×26): 10 mg via ORAL
  Filled 2017-12-22 (×28): qty 2

## 2017-12-22 NOTE — ED Notes (Signed)
Pt continues to speak loudly. Saying his stomach hurts.  Encouraged to go to the bathroom and try to have a BM. ABD soft non tender to palpation with BS + x 4 quads. Will also given PRN valium for agitation.

## 2017-12-22 NOTE — ED Notes (Signed)
Pt up to the bathroom and had a BM. Pt came out of the bathroom and states "I did it". Pt praised. Encourage to rest on his bed as it was time to go to sleep.

## 2017-12-22 NOTE — ED Provider Notes (Signed)
-----------------------------------------   12:13 AM on 12/22/2017 -----------------------------------------   Blood pressure 116/79, pulse 83, temperature 98.2 F (36.8 C), resp. rate 16, height 5\' 1"  (1.549 m), weight 68 kg, SpO2 100 %.  The patient had no acute events since last update.  Calm and cooperative at this time.  Disposition is pending Psychiatry/Behavioral Medicine team recommendations.     Merrily Brittleifenbark, Kaian Fahs, MD 12/22/17 [phone]

## 2017-12-22 NOTE — ED Notes (Signed)
Patient grimacing complaining of abdominal pain.  EDP notified.

## 2017-12-22 NOTE — ED Notes (Signed)
Pt awake and up to the bathroom. Encouraged to eat his dinner tray.

## 2017-12-22 NOTE — ED Notes (Signed)
[facility] - a nurse from [facility] came by to assess this pt concerning his return to the facility   Pt remained calm and cooperative during her assessment  - update provided to her     Guardian Derrick Marshall  [phone] demanded that we give information to nurses from [facility]     MD Derrick Marshall also spoke with Derrick [facility] during her visit  - medications, return to [facility], other facilities,  [facility] information provided to our consultant   Anything that will help get this pt back into a facility that will benefit him

## 2017-12-22 NOTE — Clinical Social Work Note (Addendum)
CSW received phone call from patient's legal guardian Reed PandyLinda Montgomery, [phone] who stated that Lupita LeashDonna the nurse from [facility] will be assessing patient today.  CSW informed bedside nurse Amy that guardian gave verbal consent to speak to [facility] nurse so they can make decision if he is able to go or not.  [facility]wait[facility] on [facility].  Ervin KnackEric R. Gordie Belvin, MSW, Theresia MajorsLCSWA [phone]  12/22/2017 2:31 PM

## 2017-12-22 NOTE — Consult Note (Signed)
[facility] Consult   Reason for Consult:  Consult follow-up for this 36 year old man with trisomy 21 in the emergency room Referring Physician:  Joni Fears Patient Identification: Derrick Marshall MRN:  [id number] Principal Diagnosis: Autistic spectrum disorder Diagnosis:   Patient Active Problem List   Diagnosis Date Noted  . Autistic spectrum disorder [F84.0] 12/12/2017  . 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 Agitation [R45.1] 12/12/2017    Total Time spent with patient: 30 minutes  Subjective:   Derrick Marshall is a 37 y.o. male patient admitted with "I want to go home".  HPI:  Follow-up note for this 37 year old man with trisomy 36 and developmental disability autistic spectrum disorder. Patient has been in the emergency room for several days. This is his second visit to our emergency room in rapid succession because of agitated violent and unmanageable behaviors at [employer]. Currently the patient's behaviors are largely limited to loud vocalization shouting sometimes needing redirection to stay in his room but he has not struck anyone in a few days. A representative from [facility] came to assess the patient today.They have a very good point that they have people who are vulnerable and also with a lot of specialize means that they have to take care of andit is virtually impossible for them to manage someone with unpredictable violence on a frequent basis. They suggest that we consider making medication adjustments to see if that could possibly help with the behavior. They also suggested that there are 4 hospital facilities they know of in the state that except patients with developmental disabilities on the psychiatric ward and that we possibly consider those. We have already been in contact with [facility] and it's not clear how likely it is that he could be readmitted there. I spoke with the patient's sister who is his guardian. She understands  this situation although she also wants to advocate strongly for her brother that he not be oversedated and that if possible he be managed in the least restrictive environment. She did agree to my suggestion to increase his Haldol to 5 mg twice a day and increase the Valium dose to 10 mg in the afternoon with an additional allowance for when necessary Valium at other times during the day. This is a first step to trying to manage some of the agitation.  Past Psychiatric History: see previous notes. Lifelong problems with behavior related to trisomy 24 and developmental disability with autism  Risk to Self: Suicidal Ideation: No Suicidal Intent: No Is patient at risk for suicide?: No Suicidal Plan?: No Access to Means: No What has been your use of drugs/alcohol within the last 12 months?: None Triggers for Past Attempts: (UTA - Unable to Assess) Intentional Self Injurious Behavior: (UTA - Unable to Assess) Risk to Others: Homicidal Ideation: No Thoughts of Harm to Others: No Current Homicidal Intent: No Current Homicidal Plan: No Access to Homicidal Means: No History of harm to others?: No Does patient have access to weapons?: No Criminal Charges Pending?: No Does patient have a court date: No Prior Inpatient Therapy: Prior Inpatient Therapy: (UTA - Unable to Assess) Prior Outpatient Therapy: Prior Outpatient Therapy: (UTA - Unable to Assess)  Past Medical History:  Past Medical History:  Diagnosis Date  . Autism   . Down syndrome    History reviewed. No pertinent surgical history. Family History: No family history on file. Family Psychiatric  History: none known. Patient is adopted Social History:  Social History   Substance and  Sexual Activity  Alcohol Use Never  . Frequency: Never     Social History   Substance and Sexual Activity  Drug Use Never    Social History   Socioeconomic History  . Marital status: Single    Spouse name: Not on file  . Number of children: Not  on file  . Years of education: Not on file  . Highest education level: Not on file  Occupational History  . Not on file  Social Needs  . Financial resource strain: Not on file  . Food insecurity:    Worry: Not on file    Inability: Not on file  . Transportation needs:    Medical: Not on file    Non-medical: Not on file  Tobacco Use  . Smoking status: Never Smoker  . Smokeless tobacco: Never Used  Substance and Sexual Activity  . Alcohol use: Never    Frequency: Never  . Drug use: Never  . Sexual activity: Not on file  Lifestyle  . Physical activity:    Days per week: Not on file    Minutes per session: Not on file  . Stress: Not on file  Relationships  . Social connections:    Talks on phone: Not on file    Gets together: Not on file    Attends religious service: Not on file    Active member of club or organization: Not on file    Attends meetings of clubs or organizations: Not on file    Relationship status: Not on file  Other Topics Concern  . Not on file  Social History Narrative  . Not on file   Additional Social History:    Allergies:  No Known Allergies  Labs:  Results for orders placed or performed during the hospital encounter of 12/17/17 (from the past 48 hour(s))  Comprehensive metabolic panel     Status: Abnormal   Collection Time: 12/21/17  9:09 PM  Result Value Ref Range   Sodium 141 135 - 145 mmol/L   Potassium 3.8 3.5 - 5.1 mmol/L   Chloride 107 98 - 111 mmol/L   CO2 29 22 - 32 mmol/L   Glucose, Bld 84 70 - 99 mg/dL   BUN 17 6 - 20 mg/dL   Creatinine, Ser 0.76 0.61 - 1.24 mg/dL   Calcium 8.8 (L) 8.9 - 10.3 mg/dL   Total Protein 7.2 6.5 - 8.1 g/dL   Albumin 3.8 3.5 - 5.0 g/dL   AST 34 15 - 41 U/L   ALT 26 0 - 44 U/L   Alkaline Phosphatase 110 38 - 126 U/L   Total Bilirubin 0.3 0.3 - 1.2 mg/dL   GFR calc non Af Amer >60 >60 mL/min   GFR calc Af Amer >60 >60 mL/min    Comment: (NOTE) The eGFR has been calculated using the CKD EPI  equation. This calculation has not been validated in all clinical situations. eGFR's persistently <60 mL/min signify possible Chronic Kidney Disease.    Anion gap 5 5 - 15    Comment: Performed at [facility], [address] [place], [place] [address]  Lipase, blood     Status: None   Collection Time: 12/21/17  9:09 PM  Result Value Ref Range   Lipase 22 11 - 51 U/L    Comment: Performed at [facility], [address] [place], [place] [address]  CBC with Differential     Status: Abnormal   Collection Time: 12/21/17  9:09 PM  Result Value Ref Range   WBC 3.9 3.8 - 10.6 K/uL   RBC 3.86 (L) 4.40 - 5.90 MIL/uL   Hemoglobin 14.1 13.0 - 18.0 g/dL   HCT 40.9 40.0 - 52.0 %   MCV 106.0 (H) 80.0 - 100.0 fL   MCH 36.5 (H) 26.0 - 34.0 pg   MCHC 34.4 32.0 - 36.0 g/dL   RDW 16.2 (H) 11.5 - 14.5 %   Platelets 142 (L) 150 - 440 K/uL   Neutrophils Relative % 47 %   Neutro Abs 1.8 1.4 - 6.5 K/uL   Lymphocytes Relative 44 %   Lymphs Abs 1.7 1.0 - 3.6 K/uL   Monocytes Relative 8 %   Monocytes Absolute 0.3 0.2 - 1.0 K/uL   Eosinophils Relative 1 %   Eosinophils Absolute 0.0 0 - 0.7 K/uL   Basophils Relative 0 %   Basophils Absolute 0.0 0 - 0.1 K/uL    Comment: Performed at [facility], [address] [place], [place] [address]    Current Facility-Administered Medications  Medication Dose Route Frequency Provider Last Rate Last Dose  . benztropine (COGENTIN) tablet 1 mg  1 mg Oral BID Harvest Dark, MD   1 mg at 12/22/17 0901  . bismuth subsalicylate (PEPTO BISMOL) 262 MG/15ML suspension 30 mL  30 mL Oral Q4H PRN Nena Polio, MD   30 mL at 12/21/17 1849  . [START ON 12/23/2017] diazepam (VALIUM) tablet 10 mg  10 mg Oral Q1400 Uniqua Kihn T, MD      . docusate sodium (COLACE) capsule 100 mg  100 mg Oral BID Harvest Dark, MD   100 mg at 12/22/17 0859  . famotidine (PEPCID) tablet 20 mg  20 mg Oral BID AC Harvest Dark, MD   20 mg at 12/22/17  0901  . haloperidol (HALDOL) tablet 5 mg  5 mg Oral BID Maizy Davanzo T, MD      . haloperidol lactate (HALDOL) injection 5 mg  5 mg Intramuscular Once Harvest Dark, MD   Stopped at 12/17/17 1906  . LORazepam (ATIVAN) injection 2 mg  2 mg Intramuscular Once Harvest Dark, MD   Stopped at 12/17/17 1825  . OLANZapine (ZYPREXA) tablet 5 mg  5 mg Oral Q8H PRN Merlyn Lot, MD   5 mg at 12/21/17 2022   Current Outpatient Medications  Medication Sig Dispense Refill  . benztropine (COGENTIN) 1 MG tablet Take 1 mg by mouth 2 (two) times daily.    . bisacodyl (DULCOLAX) 10 MG suppository Place 10 mg rectally as needed for moderate constipation. Insert 1 suppository per rectum if no bowel movements in 48 hours    . chlorhexidine (HIBICLENS) 4 % external liquid Apply 1 application topically once a week. Shower from head to toe weekly    . diazepam (VALIUM) 5 MG tablet Take 1 tablet (5 mg total) by mouth daily at 2 PM. 30 tablet 0  . docusate sodium (COLACE) 100 MG capsule Take 100 mg by mouth 2 (two) times daily.    . famotidine (PEPCID) 20 MG tablet Take 20 mg by mouth 2 (two) times daily before a meal.    . haloperidol (HALDOL) 1 MG tablet Take 3 mg by mouth 2 (two) times daily. In the morning and at 1630    . miconazole (MICOTIN) 2 % powder Apply topically daily. Apply powder to groin, abdomen and buttocks daily after shower    . Miconazole Nitrate (ALOE VESTA ANTIFUNGAL) 2 % OINT Apply 1 application topically 2 (two)  times daily as needed (itching). Apply to groin area    . Multiple Vitamin (MULTIVITAMIN WITH MINERALS) TABS tablet Take 1 tablet by mouth daily.    . Skin Protectants, Misc. (EUCERIN) cream Apply 1 application topically daily.     . vitamin B-12 (CYANOCOBALAMIN) 500 MCG tablet Take 1,000 mcg by mouth 2 (two) times daily.    . Vitamin D, Ergocalciferol, (DRISDOL) 50000 units CAPS capsule Take 50,000 Units by mouth every 7 (seven) days.    Marland Kitchen ammonium lactate (LAC-HYDRIN) 12  % lotion Apply 1 application topically daily. Apply lotion to legs and chest daily after shower      Musculoskeletal: Strength & Muscle Tone: within normal limits Gait & Station: normal Patient leans: N/A  Psychiatric Specialty Exam: Physical Exam  Nursing note and vitals reviewed. Constitutional: He appears well-developed and well-nourished.  HENT:  Head: Normocephalic and atraumatic.  Eyes: Pupils are equal, round, and reactive to light. Conjunctivae are normal.  Neck: Normal range of motion.  Cardiovascular: Regular rhythm and normal heart sounds.  Respiratory: Effort normal.  GI: Soft.  Musculoskeletal: Normal range of motion.  Neurological: He is alert.  Skin: Skin is warm and dry.  Psychiatric: His affect is labile. His speech is tangential. He is agitated. He is not aggressive. Thought content is not paranoid. Cognition and memory are impaired. He expresses impulsivity and inappropriate judgment. He expresses no homicidal and no suicidal ideation. He is noncommunicative.    Review of Systems  Unable to perform ROS: Mental acuity    Blood pressure 131/78, pulse 88, temperature 98 F (36.7 C), temperature source Oral, resp. rate 18, height 5' 1"  (1.549 m), weight 68 kg, SpO2 99 %.Body mass index is 28.33 kg/m.  General Appearance: Casual  Eye Contact:  Fair  Speech:  Garbled  Volume:  Decreased  Mood:  Euthymic  Affect:  Inappropriate and Labile  Thought Process:  Disorganized  Orientation:  Negative  Thought Content:  Tangential and hyper focused on going back "home" and unable to engage in rational back-and-forth discussion  Suicidal Thoughts:  No  Homicidal Thoughts:  No  Memory:  Immediate;   Fair Recent;   Poor Remote;   Poor  Judgement:  Impaired  Insight:  Lacking  Psychomotor Activity:  Restlessness  Concentration:  Concentration: Poor  Recall:  Poor  Fund of Knowledge:  Poor  Language:  Poor  Akathisia:  No  Handed:  Right  AIMS (if indicated):      Assets:  Physical Health Resilience Social Support  ADL's:  Impaired  Cognition:  Impaired,  Moderate and Severe  Sleep:        Treatment Plan Summary: Daily contact with patient to assess and evaluate symptoms and progress in treatment, Medication management and Plan the patient's guardian gives verbal consent to increase his Haldol dose to 5 mg twice a day. I think we can safely keep the Cogentin dose where it is. We will increase the afternoon diazepam to 5 mg which she also gives consent to as well as adding a when necessary dosage that can be used at other times during the day. I will review the plan with social work. We will try to stay in touch with [facility] to see if they would be willing to reassess at a later time.  Disposition: Supportive therapy provided about ongoing stressors.  Alethia Berthold, MD 12/22/2017 5:51 PM

## 2017-12-22 NOTE — ED Notes (Signed)
Pt's bed sheets changed per pt request.

## 2017-12-22 NOTE — ED Notes (Signed)
BEHAVIORAL HEALTH ROUNDING Patient sleeping: No. Patient alert and oriented: yes Behavior appropriate: Yes.  ; If no, describe:  Nutrition and fluids offered: yes Toileting and hygiene offered: Yes  Sitter present: q15 minute observations and security monitoring Law enforcement present: Yes    

## 2017-12-22 NOTE — ED Notes (Signed)
ED  Is the patient under IVC or is there intent for IVC: Yes.   Is the patient medically cleared: Yes.   Is there vacancy in the ED BHU: Yes.   Is the population mix appropriate for patient:  - down synrome   Is the patient awaiting placement in inpatient or outpatient setting: Yes.   Has the patient had a psychiatric consult: Yes.   Survey of unit performed for contraband, proper placement and condition of furniture, tampering with fixtures in bathroom, shower, and each patient room: Yes.  ; Findings:  APPEARANCE/BEHAVIOR Calm and cooperative NEURO ASSESSMENT Orientation: oriented to self  Denies pain Hallucinations: No.None noted (Hallucinations) Speech: Normal - loud at times  Gait: normal RESPIRATORY ASSESSMENT Even  Unlabored respirations  CARDIOVASCULAR ASSESSMENT Pulses equal   regular rate  Skin warm and dry   GASTROINTESTINAL ASSESSMENT no GI complaint EXTREMITIES Full ROM  PLAN OF CARE Provide calm/safe environment. Vital signs assessed twice daily. ED BHU Assessment once each 12-hour shift. Collaborate with TTS Lelon Mast/social work daily or as condition indicates. Assure the ED provider has rounded once each shift. Provide and encourage hygiene. Provide redirection as needed. Assess for escalating behavior; address immediately and inform ED provider.  Assess family dynamic and appropriateness for visitation as needed: Yes.  ; If necessary, describe findings:  Educate the patient/family about BHU procedures/visitation: Yes.  ; If necessary, describe findings:

## 2017-12-22 NOTE — ED Notes (Signed)
Received a call from someone stating they are Derrick Marshall - pts guardian  [phone]   She can be heard by others in the nurses station screaming at me over the phone  - demanding that we give all information to [facility] nurses  Guardian demanding medication changes  - asked if she could tell me the meds that need to be changed/adjusted and she got louder and angry that I asked  Continue to monitor pt

## 2017-12-22 NOTE — ED Notes (Signed)
BEHAVIORAL HEALTH ROUNDING Patient sleeping: Yes.   Patient alert and oriented: eyes closed  Appears to be asleep Behavior appropriate: Yes.  ; If no, describe:  Nutrition and fluids offered: Yes  Toileting and hygiene offered: sleeping Sitter present: q 15 minute observations and security monitoring Law enforcement present: yes   

## 2017-12-22 NOTE — ED Notes (Signed)
He has been to and from the [facility] multiple times this hour  - pt verbalizing that his stomach hurts   He reports that he had a BM earlier today but that his "tummy" and bottom hurts at present

## 2017-12-22 NOTE — ED Notes (Signed)
Pt awake talking loudly. Encouraged to not speak so loudly.

## 2017-12-23 DIAGNOSIS — F840 Autistic disorder: Secondary | ICD-10-CM | POA: Diagnosis not present

## 2017-12-23 NOTE — Progress Notes (Signed)
LCSW consulted with ED RN and she reported the nurse did come yesterday to assess patient but they did not report if the will take [facility]  According to coverage worker the patient will remain until Tuesday.  Called Lupita Leashonna RN of [facility] and left a voice mail message awaiting a call back  [facility] care coordinator [phone] Blase MessJill McKenny and left message requesting call back  Delta Air LinesClaudine Maxwell Martorano LCSW [phone]

## 2017-12-23 NOTE — ED Notes (Signed)
Patients legal guardian Derrick PandyLinda Marshall called and was updated on patient.

## 2017-12-23 NOTE — ED Notes (Signed)
Daily hygiene completed  - pt showered  - clean clothing provided and linens changed on his bed  Cream applied to his bottom due to redness from not cleansing himself well after a BM  NAD observed  Pt performed most of his ADL's - assistance provided with his pants and socks  Continue to monitor

## 2017-12-23 NOTE — Consult Note (Signed)
[facility] Consult   Reason for Consult: Follow-up consult for this 37 year old man with developmental disability and autistic spectrum disorder Referring Physician: Very niece Patient Identification: Derrick Marshall MRN:  [id number] Principal Diagnosis: Autistic spectrum disorder Diagnosis:   Patient Active Problem List   Diagnosis Date Noted  . Autistic spectrum disorder [F84.0] 12/12/2017  . 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 Agitation [R45.1] 12/12/2017    Total Time spent with patient: 20 minutes  Subjective:   Derrick Marshall is a 37 y.o. male patient admitted with patient not able to give any information.  HPI: Follow-up note on this patient with serious behavior problems.  Yesterday as documented I spoke with several stakeholders including his guardian and we agreed to increase medication.  Patient has not been acting out today.  Has not been physically violent.  Still verbally loud at times but has been largely redirectable.  No sign of any new side effects.  Past Psychiatric History: Long history of chronic behavior disorder with difficulty functioning in any kind of independent living  Risk to Self: Suicidal Ideation: No Suicidal Intent: No Is patient at risk for suicide?: No Suicidal Plan?: No Access to Means: No What has been your use of drugs/alcohol within the last 12 months?: None Triggers for Past Attempts: (UTA - Unable to Assess) Intentional Self Injurious Behavior: (UTA - Unable to Assess) Risk to Others: Homicidal Ideation: No Thoughts of Harm to Others: No Current Homicidal Intent: No Current Homicidal Plan: No Access to Homicidal Means: No History of harm to others?: No Does patient have access to weapons?: No Criminal Charges Pending?: No Does patient have a court date: No Prior Inpatient Therapy: Prior Inpatient Therapy: (UTA - Unable to Assess) Prior Outpatient Therapy: Prior Outpatient Therapy: (UTA - Unable to  Assess)  Past Medical History:  Past Medical History:  Diagnosis Date  . Autism   . Down syndrome    History reviewed. No pertinent surgical history. Family History: No family history on file. Family Psychiatric  History: None known Social History:  Social History   Substance and Sexual Activity  Alcohol Use Never  . Frequency: Never     Social History   Substance and Sexual Activity  Drug Use Never    Social History   Socioeconomic History  . Marital status: Single    Spouse name: Not on file  . Number of children: Not on file  . Years of education: Not on file  . Highest education level: Not on file  Occupational History  . Not on file  Social Needs  . Financial resource strain: Not on file  . Food insecurity:    Worry: Not on file    Inability: Not on file  . Transportation needs:    Medical: Not on file    Non-medical: Not on file  Tobacco Use  . Smoking status: Never Smoker  . Smokeless tobacco: Never Used  Substance and Sexual Activity  . Alcohol use: Never    Frequency: Never  . Drug use: Never  . Sexual activity: Not on file  Lifestyle  . Physical activity:    Days per week: Not on file    Minutes per session: Not on file  . Stress: Not on file  Relationships  . Social connections:    Talks on phone: Not on file    Gets together: Not on file    Attends religious service: Not on file    Active member of club or organization: Not on  file    Attends meetings of clubs or organizations: Not on file    Relationship status: Not on file  Other Topics Concern  . Not on file  Social History Narrative  . Not on file   Additional Social History:    Allergies:  No Known Allergies  Labs:  Results for orders placed or performed during the hospital encounter of 12/17/17 (from the past 48 hour(s))  Comprehensive metabolic panel     Status: Abnormal   Collection Time: 12/21/17  9:09 PM  Result Value Ref Range   Sodium 141 135 - 145 mmol/L   Potassium  3.8 3.5 - 5.1 mmol/L   Chloride 107 98 - 111 mmol/L   CO2 29 22 - 32 mmol/L   Glucose, Bld 84 70 - 99 mg/dL   BUN 17 6 - 20 mg/dL   Creatinine, Ser 0.76 0.61 - 1.24 mg/dL   Calcium 8.8 (L) 8.9 - 10.3 mg/dL   Total Protein 7.2 6.5 - 8.1 g/dL   Albumin 3.8 3.5 - 5.0 g/dL   AST 34 15 - 41 U/L   ALT 26 0 - 44 U/L   Alkaline Phosphatase 110 38 - 126 U/L   Total Bilirubin 0.3 0.3 - 1.2 mg/dL   GFR calc non Af Amer >60 >60 mL/min   GFR calc Af Amer >60 >60 mL/min    Comment: (NOTE) The eGFR has been calculated using the CKD EPI equation. This calculation has not been validated in all clinical situations. eGFR's persistently <60 mL/min signify possible Chronic Kidney Disease.    Anion gap 5 5 - 15    Comment: Performed at [facility], [address] [place], [place] [address]  Lipase, blood     Status: None   Collection Time: 12/21/17  9:09 PM  Result Value Ref Range   Lipase 22 11 - 51 U/L    Comment: Performed at [facility], [address] [place], [place] [address]  CBC with Differential     Status: Abnormal   Collection Time: 12/21/17  9:09 PM  Result Value Ref Range   WBC 3.9 3.8 - 10.6 K/uL   RBC 3.86 (L) 4.40 - 5.90 MIL/uL   Hemoglobin 14.1 13.0 - 18.0 g/dL   HCT 40.9 40.0 - 52.0 %   MCV 106.0 (H) 80.0 - 100.0 fL   MCH 36.5 (H) 26.0 - 34.0 pg   MCHC 34.4 32.0 - 36.0 g/dL   RDW 16.2 (H) 11.5 - 14.5 %   Platelets 142 (L) 150 - 440 K/uL   Neutrophils Relative % 47 %   Neutro Abs 1.8 1.4 - 6.5 K/uL   Lymphocytes Relative 44 %   Lymphs Abs 1.7 1.0 - 3.6 K/uL   Monocytes Relative 8 %   Monocytes Absolute 0.3 0.2 - 1.0 K/uL   Eosinophils Relative 1 %   Eosinophils Absolute 0.0 0 - 0.7 K/uL   Basophils Relative 0 %   Basophils Absolute 0.0 0 - 0.1 K/uL    Comment: Performed at [facility], [address] [place], [place] [address]    Current Facility-Administered Medications  Medication Dose Route Frequency Provider Last Rate Last Dose   . benztropine (COGENTIN) tablet 1 mg  1 mg Oral BID Harvest Dark, MD   1 mg at 12/23/17 0855  . bismuth subsalicylate (PEPTO BISMOL) 262 MG/15ML suspension 30 mL  30 mL Oral Q4H PRN Nena Polio, MD   30 mL at 12/21/17 1849  . diazepam (  VALIUM) tablet 10 mg  10 mg Oral Q1400 Clapacs, Madie Reno, MD   10 mg at 12/23/17 1300  . diazepam (VALIUM) tablet 10 mg  10 mg Oral Q8H PRN Clapacs, Madie Reno, MD   10 mg at 12/22/17 2139  . docusate sodium (COLACE) capsule 100 mg  100 mg Oral BID Harvest Dark, MD   100 mg at 12/23/17 0853  . famotidine (PEPCID) tablet 20 mg  20 mg Oral BID AC Harvest Dark, MD   20 mg at 12/23/17 0855  . haloperidol (HALDOL) tablet 5 mg  5 mg Oral BID Clapacs, Madie Reno, MD   5 mg at 12/23/17 0855  . haloperidol lactate (HALDOL) injection 5 mg  5 mg Intramuscular Once Harvest Dark, MD   Stopped at 12/17/17 1906  . LORazepam (ATIVAN) injection 2 mg  2 mg Intramuscular Once Harvest Dark, MD   Stopped at 12/17/17 1825  . OLANZapine (ZYPREXA) tablet 5 mg  5 mg Oral Q8H PRN Merlyn Lot, MD   5 mg at 12/21/17 2022   Current Outpatient Medications  Medication Sig Dispense Refill  . benztropine (COGENTIN) 1 MG tablet Take 1 mg by mouth 2 (two) times daily.    . bisacodyl (DULCOLAX) 10 MG suppository Place 10 mg rectally as needed for moderate constipation. Insert 1 suppository per rectum if no bowel movements in 48 hours    . chlorhexidine (HIBICLENS) 4 % external liquid Apply 1 application topically once a week. Shower from head to toe weekly    . diazepam (VALIUM) 5 MG tablet Take 1 tablet (5 mg total) by mouth daily at 2 PM. 30 tablet 0  . docusate sodium (COLACE) 100 MG capsule Take 100 mg by mouth 2 (two) times daily.    . famotidine (PEPCID) 20 MG tablet Take 20 mg by mouth 2 (two) times daily before a meal.    . haloperidol (HALDOL) 1 MG tablet Take 3 mg by mouth 2 (two) times daily. In the morning and at 1630    . miconazole (MICOTIN) 2 % powder  Apply topically daily. Apply powder to groin, abdomen and buttocks daily after shower    . Miconazole Nitrate (ALOE VESTA ANTIFUNGAL) 2 % OINT Apply 1 application topically 2 (two) times daily as needed (itching). Apply to groin area    . Multiple Vitamin (MULTIVITAMIN WITH MINERALS) TABS tablet Take 1 tablet by mouth daily.    . Skin Protectants, Misc. (EUCERIN) cream Apply 1 application topically daily.     . vitamin B-12 (CYANOCOBALAMIN) 500 MCG tablet Take 1,000 mcg by mouth 2 (two) times daily.    . Vitamin D, Ergocalciferol, (DRISDOL) 50000 units CAPS capsule Take 50,000 Units by mouth every 7 (seven) days.    Marland Kitchen ammonium lactate (LAC-HYDRIN) 12 % lotion Apply 1 application topically daily. Apply lotion to legs and chest daily after shower      Musculoskeletal: Strength & Muscle Tone: within normal limits Gait & Station: normal Patient leans: N/A  Psychiatric Specialty Exam: Physical Exam  Nursing note and vitals reviewed. Constitutional: He appears well-developed and well-nourished.  HENT:  Head: Normocephalic and atraumatic.  Eyes: Pupils are equal, round, and reactive to light. Conjunctivae are normal.  Neck: Normal range of motion.  Cardiovascular: Regular rhythm and normal heart sounds.  Respiratory: Effort normal. No respiratory distress.  GI: Soft.  Musculoskeletal: Normal range of motion.  Neurological: He is alert.  Skin: Skin is warm and dry.  Psychiatric: His affect is labile. He is not aggressive.  He is noncommunicative.    Review of Systems  Unable to perform ROS: Mental acuity    Blood pressure 125/89, pulse 98, temperature 97.8 F (36.6 C), temperature source Oral, resp. rate 16, height 5' 1"  (1.549 m), weight 68 kg, SpO2 100 %.Body mass index is 28.33 kg/m.  General Appearance: Disheveled  Eye Contact:  Fair  Speech:  Garbled and Slurred  Volume:  Decreased  Mood:  Euthymic  Affect:  Constricted  Thought Process:  Disorganized  Orientation:  Negative   Thought Content:  Illogical  Suicidal Thoughts:  No  Homicidal Thoughts:  No  Memory:  Immediate;   Fair Recent;   Poor Remote;   Poor  Judgement:  Impaired  Insight:  Shallow  Psychomotor Activity:  Decreased  Concentration:  Concentration: Poor  Recall:  Poor  Fund of Knowledge:  Poor  Language:  Poor  Akathisia:  No  Handed:  Right  AIMS (if indicated):     Assets:  Social Support  ADL's:  Impaired  Cognition:  Impaired,  Moderate and Severe  Sleep:        Treatment Plan Summary: Daily contact with patient to assess and evaluate symptoms and progress in treatment, Medication management and Plan Patient seen chart reviewed social work note reviewed.  The situation as it stands right now is that [facility] is still not willing to commit to taking him back but not absolutely refusing the possibility.  They would like him to be showing consistently improved behavior with some medication changes.  Medicine was changed yesterday and we are hoping that we may find some consistent improvement.  Meanwhile they have recommended that we continue to look into the possibility of hospitalization if any facility would accept him and to follow-up with [facility].  Patient's guardian is aware of the overall plan.  No other change for today.  Disposition: Supportive therapy provided about ongoing stressors.  Alethia Berthold, MD 12/23/2017 4:29 PM

## 2017-12-23 NOTE — ED Provider Notes (Signed)
-----------------------------------------   7:09 AM on 12/23/2017 -----------------------------------------   Blood pressure 125/89, pulse 98, temperature 97.8 F (36.6 C), temperature source Oral, resp. rate 16, height 5\' 1"  (1.549 m), weight 68 kg, SpO2 100 %.  The patient had no acute events since last update.  Calm and cooperative at this time.  Disposition is pending Psychiatry/Behavioral Medicine team recommendations.     Irean HongSung, Jade J, MD 12/23/17 [phone]

## 2017-12-23 NOTE — ED Notes (Addendum)
Breakfast placed at his bedside - he is lying on his stomach covered entirely by the blanket   Appears to be sleeping  Even, non labored respirations observed  Continue to monitor

## 2017-12-24 LAB — URINALYSIS, COMPLETE (UACMP) WITH MICROSCOPIC
BACTERIA UA: NONE SEEN
BILIRUBIN URINE: NEGATIVE
GLUCOSE, UA: NEGATIVE mg/dL
KETONES UR: NEGATIVE mg/dL
LEUKOCYTES UA: NEGATIVE
NITRITE: NEGATIVE
PROTEIN: NEGATIVE mg/dL
Specific Gravity, Urine: 1.019 (ref 1.005–1.030)
pH: 6 (ref 5.0–8.0)

## 2017-12-24 MED ORDER — ACETAMINOPHEN 325 MG PO TABS
650.0000 mg | ORAL_TABLET | Freq: Once | ORAL | Status: AC
  Administered 2017-12-24: 650 mg via ORAL

## 2017-12-24 MED ORDER — LIDOCAINE HCL URETHRAL/MUCOSAL 2 % EX GEL
1.0000 "application " | Freq: Once | CUTANEOUS | Status: AC
  Administered 2017-12-24: 1 via URETHRAL
  Filled 2017-12-24: qty 10

## 2017-12-24 MED ORDER — ACETAMINOPHEN 325 MG PO TABS
ORAL_TABLET | ORAL | Status: AC
  Administered 2017-12-24: 650 mg via ORAL
  Filled 2017-12-24: qty 2

## 2017-12-24 NOTE — ED Notes (Signed)
Hourly rounding reveals patient sleeping in room. No complaints, stable, in no acute distress. Q15 minute rounds and monitoring via Security to continue. 

## 2017-12-24 NOTE — ED Notes (Signed)
Performed in and out catherazation on patient due to c/o burning and pain. Patient tolerated both lidocaine (XYLOCAINE) 2 % jelly and in and out cath well, no issues. NAD noted.

## 2017-12-24 NOTE — ED Notes (Signed)
Breakfast placed at his bedside -patient laying on back with his head covered entirely by the blanket   Appears to be sleeping  Even, non labored respirations observed  Continue to monitor

## 2017-12-24 NOTE — ED Provider Notes (Signed)
-----------------------------------------   6:40 AM on 12/24/2017 -----------------------------------------   Blood pressure 125/89, pulse 98, temperature 97.8 F (36.6 C), temperature source Oral, resp. rate 16, height 5\' 1"  (1.549 m), weight 68 kg, SpO2 100 %.  The patient had no acute events since last update.  Calm and cooperative at this time.  Disposition is pending Psychiatry/Behavioral Medicine team recommendations.     Irean HongSung, Jade J, MD 12/24/17 [phone]

## 2017-12-24 NOTE — ED Notes (Signed)
Pt. Currently states belly hurts.  Pt. Encouraged to use bathroom for bowel movement. Pt. Anxious, pt. Continues to ask.  "When am I going home"  "Where is my sister".

## 2017-12-24 NOTE — ED Notes (Signed)
Pt. Walked out of bathroom stated "I feel better".  Pt. Encouraged to return to room, pt. Returned to room.

## 2017-12-24 NOTE — Progress Notes (Signed)
LCSW received a call from patients guardian and this worker  Relayed the [facility] and [facility]. The guardian is agreeable and reviewed that [facility] needs 60 days written notice and will need to assist with new placement. LCSW provided emotional support to guardian and listened as she voiced her concerns, she is happy to hear that her brother is responding well to medication changes. She is planning a visit this afternoon. LCSW reviewed visiting is from 12-2 and 4-6pm. No further needs   Derrick Gorum [place]i LCSW [phone]

## 2017-12-24 NOTE — ED Notes (Signed)
Spoke to patients legal guardian Bonita QuinLinda (sister), notified performed an in and out cath on patient to get an urinalysis. Due to patient complaining of pain in his penis. Patient asked to speak with Ladona HornsLinda, Linda informed me that when patient becomes anxious and ready to go, everything from his head to toes will hurt. Patient is also complaining of stomach pain. Gave patient pepto bismol

## 2017-12-24 NOTE — ED Notes (Signed)
Patient encouraged to drink water most of day to prevent dehydration

## 2017-12-24 NOTE — ED Notes (Signed)
Patient visiting with sister, patient drowsy and attempting to eat dinner

## 2017-12-24 NOTE — ED Notes (Signed)
Patient given (2) hot dogs chips and chocolate ice cream for lunch

## 2017-12-25 NOTE — ED Notes (Signed)
Hourly rounding reveals patient in room. No complaints, stable, in no acute distress. Q15 minute rounds and monitoring via Rover and Officer to continue.   

## 2017-12-25 NOTE — ED Notes (Signed)
Report to include Situation, Background, Assessment, and Recommendations received from [employer]. Patient alert and oriented, warm and dry, in no acute distress. Unable to assess SI, HI, AVH . Patient made aware of Q15 minute rounds and Psychologist, counselling presence for their safety. Patient instructed to come to me with needs or concerns.

## 2017-12-25 NOTE — ED Notes (Signed)
Patient alert and verbal. Patient is cooperative and calm. Patient does have outburst but is redirectable. Patient is taking medications as prescribed. Patient is currently in room sitting on bed. Q 15 minute checks in progress and patient remains safe on unit. Monitoring continues.

## 2017-12-25 NOTE — ED Notes (Signed)
Patient sister in to visit with him at this time.

## 2017-12-25 NOTE — ED Provider Notes (Signed)
-----------------------------------------   7:54 AM on 12/25/2017 -----------------------------------------   Blood pressure (!) 122/96, pulse 82, temperature 98.4 F (36.9 C), temperature source Oral, resp. rate 18, height 5\' 1"  (1.549 m), weight 68 kg, SpO2 100 %.  The patient had no acute events since last update.  Calm and cooperative at this time.  Disposition is pending Psychiatry/Behavioral Medicine team recommendations.     Sharyn Creamer, MD 12/25/17 [phone]

## 2017-12-25 NOTE — ED Notes (Signed)
Hourly rounding reveals patient in room sleeping. No complaints, stable, in no acute distress. When offer HS medication for second time, pt states " I don't want it". Q15 minute rounds and monitoring via Psychologist, counselling to continue.

## 2017-12-25 NOTE — ED Notes (Signed)
Hourly rounding reveals patient in room sleeping. No complaints, stable, in no acute distress. Q15 minute rounds and monitoring via Rover and Officer to continue.   

## 2017-12-26 NOTE — ED Notes (Signed)
Hourly rounding reveals patient in room yelling out at times. No complaints, stable, in no acute distress. Q15 minute rounds and monitoring via [employer] to continue.

## 2017-12-26 NOTE — ED Notes (Signed)
Hourly rounding reveals patient in room. No complaints, stable, in no acute distress. Q15 minute rounds and monitoring via Rover and Officer to continue.   

## 2017-12-26 NOTE — ED Notes (Signed)
Lunch tray given to pt.

## 2017-12-26 NOTE — ED Provider Notes (Signed)
-----------------------------------------   7:42 AM on 12/26/2017 -----------------------------------------   Blood pressure 111/66, pulse 65, temperature 98.4 F (36.9 C), temperature source Oral, resp. rate 16, height 1.549 m (5\' 1" ), weight 68 kg, SpO2 94 %.  The patient had no acute events since last update.  Calm and cooperative at this time.  Pending placement.    Loleta Rose, MD 12/26/17 [phone]

## 2017-12-26 NOTE — ED Notes (Signed)
Patient alert, warm and dry, in no acute distress. Patient made aware of Q15 minute rounds and Psychologist, counselling presence for their safety. Patient instructed to come to me with needs or concerns.

## 2017-12-26 NOTE — ED Notes (Signed)
Report to include situation, background, assessment and recommendations from Rhea RN. Patient sleeping, respirations regular and unlabored. Q15 minute rounds and security camera observation to continue.    

## 2017-12-26 NOTE — Clinical Social Work Note (Signed)
CSW contacted Ulyess Blossom [phone], Nurse for [name]. Lupita Leash is unavailable at this time. CSW left a voicemail and is awaiting a call back to determine when patient will be discharged to [facility]. CSW will continue to follow for discharge planning.   Ruthe Mannan MSW, [address] [phone]

## 2017-12-26 NOTE — ED Notes (Signed)
Breakfast tray placed in pt room. Pt sleeping 

## 2017-12-26 NOTE — ED Notes (Signed)
Pt. Repeatedly yelling out and coming out of his room requiring redirection.

## 2017-12-26 NOTE — ED Notes (Signed)
IV[facility]/  PENDING  PLACEMENT 

## 2017-12-27 MED ORDER — ONDANSETRON 4 MG PO TBDP
4.0000 mg | ORAL_TABLET | Freq: Once | ORAL | Status: AC
  Administered 2017-12-27: 4 mg via ORAL
  Filled 2017-12-27: qty 1

## 2017-12-27 NOTE — ED Notes (Signed)
Hourly rounding reveals patient sleeping in room. No complaints, stable, in no acute distress. Q15 minute rounds and monitoring via Rover and Officer to continue.  

## 2017-12-27 NOTE — ED Notes (Signed)
Hourly rounding reveals patient sleeping in room. No complaints, stable, in no acute distress. Q15 minute rounds and monitoring via Security Cameras to continue. 

## 2017-12-27 NOTE — ED Provider Notes (Signed)
Vitals:   12/25/17 1910 12/26/17 2121  BP: 111/66 (!) 115/58  Pulse: 65 86  Resp: 16 18  Temp:    SpO2: 94% 98%   Patient remains medically stable for psychiatric evaluation   Emily Filbert, MD 12/27/17 [phone]

## 2017-12-27 NOTE — ED Notes (Signed)
Patient vomited on bathroom floor.

## 2017-12-27 NOTE — ED Notes (Addendum)
Report received from [place], [place]. Q15 minute rounding sheet taken over at 2300. Patient is currently sleeping at this time. Will continue to monitor.

## 2017-12-27 NOTE — ED Notes (Signed)
Hourly rounding reveals patient in room. No complaints, stable, in no acute distress. Q15 minute rounds and monitoring via Rover and Officer to continue.   

## 2017-12-27 NOTE — ED Notes (Signed)
Pt. Requested and given prune juice for constipation.

## 2017-12-27 NOTE — ED Notes (Signed)
[facility]/Pending placement 

## 2017-12-28 DIAGNOSIS — F840 Autistic disorder: Secondary | ICD-10-CM | POA: Diagnosis not present

## 2017-12-28 MED ORDER — ZIPRASIDONE MESYLATE 20 MG IM SOLR
20.0000 mg | Freq: Once | INTRAMUSCULAR | Status: AC
  Administered 2017-12-28: 20 mg via INTRAMUSCULAR

## 2017-12-28 MED ORDER — ZIPRASIDONE MESYLATE 20 MG IM SOLR
INTRAMUSCULAR | Status: AC
  Filled 2017-12-28: qty 20
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
## 2017-12-28 NOTE — ED Notes (Signed)
BEHAVIORAL HEALTH ROUNDING Patient sleeping: No. Patient alert and oriented: yes Behavior appropriate: Yes.  ; If no, describe:  Nutrition and fluids offered: yes Toileting and hygiene offered: Yes  Sitter present: q15 minute observations and security monitoring Law enforcement present: Yes    

## 2017-12-28 NOTE — Clinical Social Work Note (Signed)
CSW received a voicemail from patient sister/legal guardian Reed Pandy [phone]) expressing concern and frustration about patient being given Zyprexa after she asked that he not be given this medication due to patient's adverse reaction. Ms. Ericka Pontiff stated she will follow up with [name] and has already spoken with [facility] Noreene Larsson about patient placement and possible [facility] respite. CSW to follow up also. CSW continuing to follow for discharge needs.  Corlis Hove, Theresia Majors, [facility] Clinical Social Worker-Emergency Department [phone]

## 2017-12-28 NOTE — ED Notes (Signed)
Shower provided - linens changed

## 2017-12-28 NOTE — ED Notes (Signed)
He has removed his clothing and is sitting in his room with his hands on his privates and digging in his anus  - pt encouraged to put his clothes back on
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
## 2017-12-28 NOTE — ED Notes (Signed)

## 2017-12-28 NOTE — ED Provider Notes (Signed)
-----------------------------------------   5:59 AM on 12/28/2017 -----------------------------------------   Blood pressure (!) 115/58, pulse 86, temperature 98.4 F (36.9 C), temperature source Oral, resp. rate 18, height 1.549 m (5\' 1" ), weight 68 kg, SpO2 98 %.  The patient had no acute events since last update.  Calm and cooperative at this time.  Placement is pending.     Loleta Rose, MD 12/28/17 0600

## 2017-12-28 NOTE — ED Notes (Signed)
ED  Is the patient under IVC or is there intent for IVC: Yes.   Is the patient medically cleared: Yes.   Is there vacancy in the ED BHU: Yes.   Is the population mix appropriate for patient: Yes.   Is the patient awaiting placement in inpatient or outpatient setting: Yes   Has the patient had a psychiatric consult: Yes.   Survey of unit performed for contraband, proper placement and condition of furniture, tampering with fixtures in bathroom, shower, and each patient room: Yes.  ; Findings:  APPEARANCE/BEHAVIOR Calm and cooperative NEURO ASSESSMENT Orientation: oriented x3  Denies pain Hallucinations: No.None noted (Hallucinations) Speech: Normal  Garbled at times  Gait: normal RESPIRATORY ASSESSMENT Even  Unlabored respirations  CARDIOVASCULAR ASSESSMENT Pulses equal   regular rate  Skin warm and dry   GASTROINTESTINAL ASSESSMENT no GI complaint EXTREMITIES Full ROM  PLAN OF CARE Provide calm/safe environment. Vital signs assessed twice daily. ED BHU Assessment once each 12-hour shift. Collaborate with TTS daily or as condition indicates. Assure the ED provider has rounded once each shift. Provide and encourage hygiene. Provide redirection as needed. Assess for escalating behavior; address immediately and inform ED provider.  Assess family dynamic and appropriateness for visitation as needed: Yes.  ; If necessary, describe findings:  Educate the patient/family about BHU procedures/visitation: Yes.  ; If necessary, describe findings:

## 2017-12-28 NOTE — ED Notes (Signed)
Patient observed lying in bed with eyes closed  Even, unlabored respirations observed   NAD pt appears to be sleeping  I will continue to monitor along with every 15 minute visual observations and ongoing security monitoring    

## 2017-12-28 NOTE — ED Notes (Signed)
Pt sleeping. Supper tray placed on pt bed.
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
## 2017-12-28 NOTE — ED Notes (Signed)
BEHAVIORAL HEALTH ROUNDING Patient sleeping: Yes.   Patient alert and oriented: eyes closed  Appears to be asleep Behavior appropriate: Yes.  ; If no, describe:  Nutrition and fluids offered: Yes  Toileting and hygiene offered: sleeping Sitter present: q 15 minute observations and security monitoring Law enforcement present: yes   

## 2017-12-29 MED ORDER — ZIPRASIDONE MESYLATE 20 MG IM SOLR
INTRAMUSCULAR | Status: AC
  Administered 2017-12-29: 10 mg via INTRAMUSCULAR
  Filled 2017-12-29: qty 20

## 2017-12-29 MED ORDER — ZIPRASIDONE MESYLATE 20 MG IM SOLR
10.0000 mg | Freq: Once | INTRAMUSCULAR | Status: AC
  Administered 2017-12-29: 10 mg via INTRAMUSCULAR

## 2017-12-29 NOTE — ED Notes (Signed)
BEHAVIORAL HEALTH ROUNDING Patient sleeping: No. Patient alert and oriented: yes Behavior appropriate: Yes.  ; If no, describe:  Nutrition and fluids offered: yes Toileting and hygiene offered: Yes  Sitter present: q15 minute observations and security  monitoring Law enforcement present: Yes  ODS  

## 2017-12-29 NOTE — ED Notes (Signed)
Pt. Currently awake in room watching tv.  Pt. Calm and cooperative at this time.

## 2017-12-29 NOTE — ED Notes (Signed)
Patient observed lying in bed with eyes closed  Even, unlabored respirations observed   NAD pt appears to be sleeping  I will continue to monitor along with every 15 minute visual observations and ongoing security monitoring    

## 2017-12-29 NOTE — ED Notes (Signed)
Dinner tray and drink given to pt 

## 2017-12-29 NOTE — ED Notes (Signed)
ED  Is the patient under IVC or is there intent for IVC: Yes.   Is the patient medically cleared: Yes.   Is there vacancy in the ED BHU: Yes.   Is the population mix appropriate for patient: yes    Is the patient awaiting placement in inpatient or outpatient setting: Yes.   Has the patient had a psychiatric consult: Yes.   Survey of unit performed for contraband, proper placement and condition of furniture, tampering with fixtures in bathroom, shower, and each patient room: Yes.  ; Findings:  APPEARANCE/BEHAVIOR Calm and cooperative NEURO ASSESSMENT Orientation: oriented to self  Denies pain Hallucinations: No.None noted (Hallucinations) Speech: Normal Gait: normal RESPIRATORY ASSESSMENT Even  Unlabored respirations  CARDIOVASCULAR ASSESSMENT Pulses equal   regular rate  Skin warm and dry   GASTROINTESTINAL ASSESSMENT no GI complaint EXTREMITIES Full ROM  PLAN OF CARE Provide calm/safe environment. Vital signs assessed twice daily. ED BHU Assessment once each 12-hour shift. Collaborate with TTS daily or as condition indicates. Assure the ED provider has rounded once each shift. Provide and encourage hygiene. Provide redirection as needed. Assess for escalating behavior; address immediately and inform ED provider.  Assess family dynamic and appropriateness for visitation as needed: Yes.  ; If necessary, describe findings:  Educate the patient/family about BHU procedures/visitation: Yes.  ; If necessary, describe findings:

## 2017-12-29 NOTE — ED Notes (Signed)

## 2017-12-29 NOTE — ED Notes (Signed)
BEHAVIORAL HEALTH ROUNDING Patient sleeping: Yes.   Patient alert and oriented: eyes closed  Appears to be asleep Behavior appropriate: Yes.  ; If no, describe:  Nutrition and fluids offered: Yes  Toileting and hygiene offered: sleeping Sitter present: q 15 minute observations and security monitoring Law enforcement present: yes  ODS 

## 2017-12-29 NOTE — ED Notes (Signed)
Patient in room removing clothing.  Patient anxious and agitated stating, "I want to go home."

## 2017-12-29 NOTE — ED Notes (Signed)
Q15 minute rounding sheet done on patient by this EDT from 0030-0700. Patient become agitated around 0340 complaining of pain and stating "I want to go home." Patient started to remove clothes, this EDT redirected patient and assisted patient with putting purple scrubs pants back on patient. Everardo Pacific, RN notified. Patient is currently sleeping at this time. Will continue to monitor.

## 2017-12-29 NOTE — ED Notes (Addendum)
Patient awake in hallway speaking to security. Patient anxious.

## 2017-12-29 NOTE — Clinical Social Work Note (Signed)
No new developments. Patient awaiting respite bed at [facility]. CSW continuing to follow for discharge needs.  Corlis Hove, Theresia Majors, [facility] Clinical Social Worker-Emergency Department [phone]

## 2017-12-29 NOTE — ED Provider Notes (Signed)
-----------------------------------------   4:05 AM on 12/29/2017 -----------------------------------------  Patient escalated his behavior, removing his pants.  Unable to be verbally redirected. Requiring IM calming agent.  ----------------------------------------- 6:20 AM on 12/29/2017 -----------------------------------------   Blood pressure 111/72, pulse 90, temperature 97.9 F (36.6 C), temperature source Oral, resp. rate 17, height 5\' 1"  (1.549 m), weight 68 kg, SpO2 97 %.  Calming agent given with good success.  Patient is sleeping currently.  Disposition is pending Psychiatry/Behavioral Medicine team recommendations.     Irean Hong, MD 12/29/17 [phone]

## 2017-12-30 MED ORDER — SIMETHICONE 80 MG PO CHEW
80.0000 mg | CHEWABLE_TABLET | Freq: Once | ORAL | Status: AC
  Administered 2017-12-30: 80 mg via ORAL
  Filled 2017-12-30: qty 1

## 2017-12-30 NOTE — ED Notes (Signed)
IVC, ivc to expire on 9/7  Pending placement

## 2017-12-30 NOTE — ED Notes (Signed)
Pt. Calm and cooperative at this time.  Pt. In room watching music videos.

## 2017-12-30 NOTE — ED Provider Notes (Signed)
-----------------------------------------   3:11 AM on 12/30/2017 -----------------------------------------   Blood pressure 107/69, pulse 95, temperature 98 F (36.7 C), temperature source Oral, resp. rate 17, height 5\' 1"  (1.549 m), weight 68 kg, SpO2 97 %.  The patient had no acute events since last update.  Calm and cooperative at this time.  Disposition is pending per Psychiatry/Behavioral Medicine team recommendations.     Nita Sickle, MD 12/30/17 [phone]

## 2017-12-30 NOTE — Progress Notes (Signed)
LCSW called patients CCC Derrick Marshall [phone] She has been working on placements through [employer] and another ICF and is awaiting to see if they have available beds. In addition she informed this worker that Derrick Marshall home was to put in an Emergency discharge notification to [facility] which has not been done.   LCSW was informed that [facility] care coordinator Derrick Marshall has requested respite services from [facility] at this time and is awaiting call back. ( Patient was there for a year ) and was only DC 30 days ago.  [name] LCSW

## 2017-12-30 NOTE — ED Notes (Signed)
Pt complaining of stomach pain and needing to have a bowel movement. EDP notified. Medication will be administered as ordered.   Maintained on 15 minute checks and observation by security camera for safety.

## 2017-12-31 NOTE — ED Notes (Signed)
IVC reinstated by Dr Toni Amend, pending placement

## 2017-12-31 NOTE — ED Notes (Signed)
ED Is the patient under IVC or is there intent for IVC: Yes.   Is the patient medically cleared: Yes.   Is there vacancy in the ED BHU: Yes.   Is the population mix appropriate for patient: Yes.   Is the patient awaiting placement in inpatient or outpatient setting: Yes.  Group home placement Has the patient had a psychiatric consult: Yes.   Survey of unit performed for contraband, proper placement and condition of furniture, tampering with fixtures in bathroom, shower, and each patient room: Yes.  ; Findings:  APPEARANCE/BEHAVIOR Calm and cooperative NEURO ASSESSMENT Orientation: oriented to self, place   Denies pain Hallucinations: No.None noted (Hallucinations)  Speech: Normal  Garbled at times  Gait: normal RESPIRATORY ASSESSMENT Even  Unlabored respirations  CARDIOVASCULAR ASSESSMENT Pulses equal   regular rate  Skin warm and dry   GASTROINTESTINAL ASSESSMENT no GI complaint EXTREMITIES Full ROM  PLAN OF CARE Provide calm/safe environment. Vital signs assessed twice daily. ED BHU Assessment once each 12-hour shift. Collaborate with TTS daily or as condition indicates. Assure the ED provider has rounded once each shift. Provide and encourage hygiene. Provide redirection as needed. Assess for escalating behavior; address immediately and inform ED provider.  Assess family dynamic and appropriateness for visitation as needed: Yes.  ; If necessary, describe findings:  Educate the patient/family about BHU procedures/visitation: Yes.  ; If necessary, describe findings:

## 2017-12-31 NOTE — ED Notes (Signed)
BEHAVIORAL HEALTH ROUNDING Patient sleeping: No. Patient alert and oriented: yes Behavior appropriate: Yes.  ; If no, describe:  Nutrition and fluids offered: yes Toileting and hygiene offered: Yes  Sitter present: q15 minute observations and security  monitoring Law enforcement present: Yes  ODS  

## 2017-12-31 NOTE — ED Notes (Signed)

## 2017-12-31 NOTE — Progress Notes (Signed)
LCSW spoke to EDP and TTS IVC will be updated as it is has expired LCSW was informed Dr Toni Amend will be coming in to update documents.  LCSW spoke to EDP in regards to guardians concerns that PRN medications were administered to her brother CELEXA and Or ZYPREXA- she reports that he becomes very aggressive on Zyprexa and loses time ( awake at night sleeps all day) EDP made notations of guardians concern  Derrick Marshall( guardian) would like to be notified of any medication changes occur with her brother LCSW and guardian reviewed current issue of placmenet awaiting a respite bed at [facility] as per [facility] and additional group homes have been consulted by [facility] and they are awaiting call back on Monday. [phone]  Arrie Senate LCSW [phone]

## 2017-12-31 NOTE — Progress Notes (Signed)
LCSW continues to look with Guardian and [facility] Care Manager potential homes and increased level of care for patient. No further needs at this time.  [employer] LCSW [phone]

## 2017-12-31 NOTE — ED Provider Notes (Signed)
-----------------------------------------   6:47 AM on 12/31/2017 -----------------------------------------   Blood pressure (!) 113/58, pulse 97, temperature 98 F (36.7 C), temperature source Oral, resp. rate 18, height 5\' 1"  (1.549 m), weight 68 kg, SpO2 100 %.  The patient had no acute events since last update.  Sleeping at this time.  Disposition is pending Psychiatry/Behavioral Medicine team recommendations.     Irean Hong, MD 12/31/17 [phone]

## 2017-12-31 NOTE — ED Notes (Signed)
BEHAVIORAL HEALTH ROUNDING Patient sleeping: Yes.   Patient alert and oriented: eyes closed  Appears to be asleep Behavior appropriate: Yes.  ; If no, describe:  Nutrition and fluids offered: Yes  Toileting and hygiene offered: sleeping Sitter present: q 15 minute observations and security monitoring Law enforcement present: yes  ODS 

## 2017-12-31 NOTE — ED Notes (Signed)
Patient observed lying in bed with eyes closed  Even, unlabored respirations observed   NAD pt appears to be sleeping  I will continue to monitor along with every 15 minute visual observations and ongoing security monitoring    

## 2017-12-31 NOTE — ED Notes (Signed)
Pt. In room watching music videos on tv.

## 2017-12-31 NOTE — ED Notes (Signed)
Change of clothes provided  He is now sitting up in bed eating - once finished I will change his linens   Denies wanting a shower today

## 2018-01-01 MED ORDER — ZIPRASIDONE MESYLATE 20 MG IM SOLR
10.0000 mg | Freq: Once | INTRAMUSCULAR | Status: AC
  Administered 2018-01-01: 10 mg via INTRAMUSCULAR
  Filled 2018-01-01: qty 20

## 2018-01-01 NOTE — Progress Notes (Signed)
Pt asleep when reassessed post Geodon dose at 1641. Bed linen changed due to feces smear on it. Safety maintained at Q 15 minutes intervals.

## 2018-01-01 NOTE — ED Notes (Signed)
Hourly rounding reveals patient sleeping in room. No complaints, stable, in no acute distress. Q15 minute rounds and monitoring via Rover and Officer to continue.  

## 2018-01-01 NOTE — Progress Notes (Addendum)
Pt is irritable at this time. Takes his clothes off will not put clothes back on. Spontaneous running to his room and bathroom while naked. Pt smeared stool on the wall in his room. Verbal redirections ineffective at this time. Geodon 10 mg IM given (right deltoid) as ordered. Will continue to provide support as needed. Q 15 minutes safety checks maintained.

## 2018-01-01 NOTE — ED Notes (Signed)
Pt given lunch tray.

## 2018-01-01 NOTE — ED Notes (Signed)
This tech provided pt with supplies to take shower and pt took shower with assistance. Pt observed brushing teeth after shower. When in new scrubs pt is continually saying "Watch this" and pulling pants down.

## 2018-01-01 NOTE — ED Provider Notes (Signed)
-----------------------------------------   8:02 AM on 01/01/2018 -----------------------------------------   Blood pressure 111/67, pulse 99, temperature 98.2 F (36.8 C), resp. rate 17, height 5\' 1"  (1.549 m), weight 68 kg, SpO2 99 %.  The patient had no acute events since last update.  Calm and cooperative at this time.  Disposition is pending Psychiatry/Behavioral Medicine team recommendations.    Dionne Bucy, MD 01/01/18 [phone]

## 2018-01-01 NOTE — ED Provider Notes (Signed)
Patient has begun to smear his stool on the wall and getting naked and walking around the room.  He is not redirectable.  We will attempt to calm him down with 10 of Geodon.   Arnaldo Natal, MD 01/01/18 1524

## 2018-01-01 NOTE — ED Notes (Signed)
Report to include situation, background, assessment and recommendations from [place] RN. Patient sleeping, respirations regular and unlabored. Q15 minute rounds and security camera observation to continue.   

## 2018-01-02 NOTE — ED Notes (Signed)
Hourly rounding reveals patient in room. No complaints, stable, in no acute distress. Q15 minute rounds and monitoring via Rover and Officer to continue.   

## 2018-01-02 NOTE — ED Notes (Signed)
BEHAVIORAL HEALTH ROUNDING Patient sleeping: No. Patient alert and oriented: yes Behavior appropriate: Yes.  ; If no, describe:  Nutrition and fluids offered: yes Toileting and hygiene offered: Yes  Sitter present: q15 minute observations and security  monitoring Law enforcement present: Yes  ODS  

## 2018-01-02 NOTE — ED Notes (Signed)
EDP made aware of patients c/o his "peepee hurts".

## 2018-01-02 NOTE — ED Notes (Signed)
Pt observed with no unusual behavior  Appropriate to stimulation  No verbalized needs or concerns at this time  NAD assessed  Continue to monitor 

## 2018-01-02 NOTE — ED Notes (Signed)
ED  Is the patient under IVC or is there intent for IVC: Yes.   Is the patient medically cleared: Yes.   Is there vacancy in the ED BHU: Yes.   Is the population mix appropriate for patient: Yes.   Is the patient awaiting placement in inpatient or outpatient setting: Yes.  Group home/long term placement   Has the patient had a psychiatric consult: Yes.   Survey of unit performed for contraband, proper placement and condition of furniture, tampering with fixtures in bathroom, shower, and each patient room: Yes.  ; Findings:  APPEARANCE/BEHAVIOR Calm and cooperative NEURO ASSESSMENT Orientation: oriented x3  Denies pain Hallucinations: No.None noted (Hallucinations) denies Speech: Normal  Garbled at times   Gait: normal RESPIRATORY ASSESSMENT Even  Unlabored respirations  CARDIOVASCULAR ASSESSMENT Pulses equal   regular rate  Skin warm and dry   GASTROINTESTINAL ASSESSMENT no GI complaint EXTREMITIES Full ROM  PLAN OF CARE Provide calm/safe environment. Vital signs assessed twice daily. ED BHU Assessment once each 12-hour shift. Collaborate with TTS daily or as condition indicates. Assure the ED provider has rounded once each shift. Provide and encourage hygiene. Provide redirection as needed. Assess for escalating behavior; address immediately and inform ED provider.  Assess family dynamic and appropriateness for visitation as needed: Yes.  ; If necessary, describe findings:  Educate the patient/family about BHU procedures/visitation: Yes.  ; If necessary, describe findings:

## 2018-01-02 NOTE — ED Notes (Signed)
Pt encouraged while he cleansed his privates and applied barrier cream - he had come to the door way stating  "my peepee hurt"   Continue to monitor

## 2018-01-02 NOTE — ED Notes (Signed)
Pt facial hair shaved by this tech. Pt cooperative.

## 2018-01-02 NOTE — ED Notes (Signed)
Pt given ginger ale and a chocolate ice cream.

## 2018-01-02 NOTE — ED Notes (Signed)
Pt in hallway saying "watch this." Pt pulled down pants. Pt instructed to pull pants back up. Pt did so. Pt to his rm then stripped himself of all his clothing multiple times. Door closed. Pt put clothes back on and opened door.

## 2018-01-02 NOTE — ED Notes (Signed)
Hourly rounding reveals patient sleeping in room. No complaints, stable, in no acute distress. Q15 minute rounds and monitoring via Rover and Officer to continue.  

## 2018-01-02 NOTE — ED Notes (Signed)
Hourly rounding reveals patient in room eating. No complaints, stable, in no acute distress. Q15 minute rounds and monitoring via Psychologist, counselling to continue.

## 2018-01-02 NOTE — ED Provider Notes (Signed)
-----------------------------------------   8:37 AM on 01/02/2018 -----------------------------------------   Blood pressure 113/89, pulse 75, temperature 98 F (36.7 C), temperature source Oral, resp. rate 20, height 5\' 1"  (1.549 m), weight 68 kg, SpO2 99 %.  The patient had no acute events since last update.  Calm and cooperative at this time.  Disposition is pending Psychiatry/Behavioral Medicine team recommendations.     Merrily Brittle, MD 01/02/18 [phone]

## 2018-01-02 NOTE — ED Notes (Signed)
Pt given lunch tray.

## 2018-01-02 NOTE — ED Notes (Signed)
Report to include Situation, Background, Assessment, and Recommendations received from RN Amy. Patient alert and oriented, warm and dry, in no acute distress. Patient denies SI, HI, AVH and pain. Patient made aware of Q15 minute rounds and Rover and Officer presence for their safety. Patient instructed to come to me with needs or concerns.    

## 2018-01-02 NOTE — ED Notes (Signed)

## 2018-01-03 NOTE — ED Notes (Signed)
BEHAVIORAL HEALTH ROUNDING Patient sleeping: No. Patient alert and oriented: yes Behavior appropriate: Yes.  ; If no, describe:  Nutrition and fluids offered: yes Toileting and hygiene offered: Yes  Sitter present: q15 minute observations and security  monitoring Law enforcement present: Yes  ODS  

## 2018-01-03 NOTE — ED Notes (Addendum)
Pt given a shower and is back in room sitting on the bed. Linens changed, trash removed from room and floor swept

## 2018-01-03 NOTE — ED Notes (Signed)
Hourly rounding reveals patient in room. No complaints, stable, in no acute distress. Q15 minute rounds and monitoring via Rover and Officer to continue.   

## 2018-01-03 NOTE — ED Notes (Signed)

## 2018-01-03 NOTE — ED Notes (Signed)
Pt observed with no unusual behavior  Appropriate to stimulation  No verbalized needs or concerns at this time  NAD assessed  Continue to monitor 

## 2018-01-03 NOTE — ED Notes (Addendum)
Hourly rounding reveals patient in room. No complaints, stable, in no acute distress. Q15 minute rounds and monitoring via Rover and Officer to continue.   

## 2018-01-03 NOTE — ED Notes (Signed)
ED  Is the patient under IVC or is there intent for IVC: Yes.   Is the patient medically cleared: Yes.   Is there vacancy in the ED BHU: Yes.   Is the population mix appropriate for patient: Yes.   Is the patient awaiting placement in inpatient or outpatient setting: Yes.   Has the patient had a psychiatric consult: Yes.   Survey of unit performed for contraband, proper placement and condition of furniture, tampering with fixtures in bathroom, shower, and each patient room: Yes.  ; Findings:  APPEARANCE/BEHAVIOR Calm and cooperative NEURO ASSESSMENT Orientation: oriented to self and place   Denies pain Hallucinations: No.None noted (Hallucinations) Speech: Normal Gait: normal RESPIRATORY ASSESSMENT Even  Unlabored respirations  CARDIOVASCULAR ASSESSMENT Pulses equal   regular rate  Skin warm and dry   GASTROINTESTINAL ASSESSMENT no GI complaint EXTREMITIES Full ROM  PLAN OF CARE Provide calm/safe environment. Vital signs assessed twice daily. ED BHU Assessment once each 12-hour shift. Collaborate with TTS daily or as condition indicates. Assure the ED provider has rounded once each shift. Provide and encourage hygiene. Provide redirection as needed. Assess for escalating behavior; address immediately and inform ED provider.  Assess family dynamic and appropriateness for visitation as needed: Yes.  ; If necessary, describe findings:  Educate the patient/family about BHU procedures/visitation: Yes.  ; If necessary, describe findings:

## 2018-01-03 NOTE — ED Notes (Signed)
Hourly rounding reveals patient in room sleeping. No complaints, stable, in no acute distress. Q15 minute rounds and monitoring via Rover and Officer to continue.   

## 2018-01-03 NOTE — ED Notes (Signed)
Pt given breakfast tray

## 2018-01-03 NOTE — ED Notes (Signed)
Pt given meal tray.

## 2018-01-03 NOTE — ED Provider Notes (Signed)
-----------------------------------------   8:04 AM on 01/03/2018 -----------------------------------------   BP 110/74 (BP Location: Right Arm)   Pulse 76   Temp 97.8 F (36.6 C) (Oral)   Resp 18   Ht 1.549 m (5\' 1" )   Wt 68 kg   SpO2 100%   BMI 28.33 kg/m   No acute events overnight. Vitals reviewed. Patient remains medically cleared.  Disposition is pending per Psychiatry/Behavioral Medicine team recommendations.    Jene Every, MD 01/03/18 [phone]

## 2018-01-04 MED ORDER — DIPHENHYDRAMINE HCL 12.5 MG/5ML PO ELIX
50.0000 mg | ORAL_SOLUTION | Freq: Once | ORAL | Status: AC
  Administered 2018-01-04: 50 mg via ORAL
  Filled 2018-01-04: qty 20

## 2018-01-04 NOTE — ED Notes (Signed)
Patient alert, oriented and verbal. Patient calm and cooperative. Patient with Q 15 minute checks in progress and remains safe on unit. Monitoring continues.

## 2018-01-04 NOTE — ED Notes (Signed)
Pt attempted to take linen basket into another pts room. Redirected pt back into his own room.

## 2018-01-04 NOTE — ED Notes (Signed)
Patient sleeping

## 2018-01-04 NOTE — ED Notes (Addendum)
Patient in and out of room stating "my voice hurts" "it hurts". Patient redirected back to room, RN aware.  This EDT provided a new warm blanket for comfort. Will contiue to monitor.

## 2018-01-04 NOTE — ED Notes (Signed)
Report given from Mayra, EDT. Q15 minute rounding sheet taken over. Patient is currently on bed on all fours looking out of room. Will contiue to monitor.

## 2018-01-04 NOTE — ED Notes (Signed)
Pt up to the restroom

## 2018-01-04 NOTE — Clinical Social Work Note (Signed)
[facility] Care Coordinator still working on placement for patient.  [facility]. Cornelis Kluver, MSW, Theresia Majors [phone]  01/04/2018 4:51 PM

## 2018-01-04 NOTE — ED Notes (Signed)
IV[facility]/  PENDING  PLACEMENT 

## 2018-01-04 NOTE — ED Notes (Signed)
Pt given food tray.

## 2018-01-04 NOTE — ED Notes (Signed)
Pt threw away coloring book and crayons in the trash. When asked pt for reason he had no response. Pt redirected into room.

## 2018-01-04 NOTE — ED Provider Notes (Signed)
  Physical Exam  BP 105/64   Pulse 82   Temp 98.4 F (36.9 C) (Oral)   Resp 18   Ht 5\' 1"  (1.549 m)   Wt 68 kg   SpO2 99%   BMI 28.33 kg/m   Physical Exam  ED Course/Procedures     Procedures  MDM  No issues overnight. Remains medically stable. Pending psych placement    Charlynne Pander, MD 01/04/18 [phone]

## 2018-01-04 NOTE — ED Notes (Signed)
[facility]/Pending placement 

## 2018-01-05 NOTE — Clinical Social Work Note (Signed)
CSW attempted to call Blase MessJill McKenny [phone] [facility] Coordinator to see if she has any updates on [facility], left a message awaiting for call back.  Ervin KnackEric R. Eduar Kumpf, MSW, Theresia MajorsLCSWA [phone] 01/05/2018 5:04 PM

## 2018-01-05 NOTE — ED Provider Notes (Signed)
Vitals:   01/03/18 1110 01/04/18 1427  BP: 105/64 (!) 110/58  Pulse: 82 88  Resp: 18 18  Temp: 98.4 F (36.9 C) 98 F (36.7 C)  SpO2: 99% 99%     No acute events reported to me overnight.  Patient remains on involuntary commitment awaiting psychiatric disposition.   Governor RooksLord, Yaretzi Ernandez, MD 01/05/18 [phone]

## 2018-01-06 NOTE — ED Provider Notes (Signed)
-----------------------------------------   6:46 AM on 01/06/2018 -----------------------------------------   Blood pressure 110/60, pulse 82, temperature 98 F (36.7 C), temperature source Oral, resp. rate 18, height 5\' 1"  (1.549 m), weight 68 kg, SpO2 99 %.  The patient had no acute events since last update.  Calm and cooperative at this time.  Disposition is pending Psychiatry/Behavioral Medicine team recommendations.     Irean HongSung, Caidence Kaseman J, MD 01/06/18 [phone]

## 2018-01-06 NOTE — Progress Notes (Signed)
[facility] and left a follow up message awaiting call back.  Delta Air LinesClaudine Samhitha Rosen LCSW [phone]

## 2018-01-06 NOTE — Progress Notes (Signed)
LCSW received call from patients care coordinator and was giving there following facts. The [facility] _ [facility] therefore [facility], they report they will need time for additional in home support workers [facility]( CCC will need approx 1 week to set this up) In the meantime 2 other facilities are reviewing the patient for future placments. If they accept him he would be able to go in approx 2 weeks. [facility] is trying to see if [name] will take patient for respite care if and only if he has a future placement to discharge to. The guardian is agreeable with this plan.  [facility] is agreeable to meet weekday SW on Wednesday at 11am at the [facility] to assist in the screening process for this patient,   [name] LCSW

## 2018-01-06 NOTE — ED Notes (Signed)
Pt. Watching music tv videos in room.  Pt. Calm and cooperative at this time.

## 2018-01-07 MED ORDER — CHLORPROMAZINE HCL 25 MG PO TABS
ORAL_TABLET | ORAL | Status: AC
  Filled 2018-01-07: qty 1

## 2018-01-07 NOTE — ED Notes (Signed)
BEHAVIORAL HEALTH ROUNDING Patient sleeping: No. Patient alert and oriented: yes Behavior appropriate: Yes.  ; If no, describe:  Nutrition and fluids offered: yes Toileting and hygiene offered: Yes  Sitter present: q15 minute observations and security  monitoring Law enforcement present: Yes  ODS  

## 2018-01-07 NOTE — ED Notes (Signed)
ED  Is the patient under IVC or is there intent for IVC: Yes.   Is the patient medically cleared: Yes.   Is there vacancy in the ED BHU: Yes.   Is the population mix appropriate for patient: Yes.   Is the patient awaiting placement in inpatient or outpatient setting: Yes.   Has the patient had a psychiatric consult: Yes.   Survey of unit performed for contraband, proper placement and condition of furniture, tampering with fixtures in bathroom, shower, and each patient room: Yes.  ; Findings:  APPEARANCE/BEHAVIOR Calm and cooperative NEURO ASSESSMENT Orientation: alert  Oriented to self and place   Denies pain Hallucinations: No.None noted (Hallucinations)  denies Speech: Normal Gait: normal RESPIRATORY ASSESSMENT Even  Unlabored respirations  CARDIOVASCULAR ASSESSMENT Pulses equal   regular rate  Skin warm and dry   GASTROINTESTINAL ASSESSMENT no GI complaint EXTREMITIES Full ROM  PLAN OF CARE Provide calm/safe environment. Vital signs assessed twice daily. ED BHU Assessment once each 12-hour shift. Collaborate with TTS daily or as condition indicates. Assure the ED provider has rounded once each shift. Provide and encourage hygiene. Provide redirection as needed. Assess for escalating behavior; address immediately and inform ED provider.  Assess family dynamic and appropriateness for visitation as needed: Yes.  ; If necessary, describe findings:  Educate the patient/family about BHU procedures/visitation: Yes.  ; If necessary, describe findings:

## 2018-01-07 NOTE — ED Notes (Addendum)

## 2018-01-07 NOTE — ED Provider Notes (Signed)
Vitals:   01/06/18 1919 01/07/18 1345  BP: 111/67 115/85  Pulse: 91 85  Resp: 18 17  Temp:  97.9 F (36.6 C)  SpO2: 100% 99%    No acute events reported to me overnight.  Patient is here on involuntary commitment awaiting psychiatric disposition.   Governor RooksLord, Shenna Brissette, MD 01/07/18 [phone]

## 2018-01-07 NOTE — ED Notes (Signed)
Patient observed lying in bed with eyes closed  Even, unlabored respirations observed   NAD pt appears to be sleeping  I will continue to monitor along with every 15 minute visual observations and ongoing security monitoring    

## 2018-01-07 NOTE — ED Notes (Addendum)
BEHAVIORAL HEALTH ROUNDING Patient sleeping: Yes.   Patient alert and oriented: eyes closed  Appears to be asleep Behavior appropriate: Yes.  ; If no, describe:  Nutrition and fluids offered: Yes  Toileting and hygiene offered: sleeping Sitter present: q 15 minute observations and security monitoring Law enforcement present: yes  ODS 

## 2018-01-07 NOTE — ED Notes (Signed)
Pt. In room watching music videos while sitting on bed.  Pt. Calm and cooperative at this time.

## 2018-01-07 NOTE — ED Notes (Signed)
Pt. States he is having heart burn.

## 2018-01-08 MED ORDER — ZIPRASIDONE MESYLATE 20 MG IM SOLR
INTRAMUSCULAR | Status: AC
  Administered 2018-01-08: 20 mg via INTRAMUSCULAR
  Filled 2018-01-08: qty 20

## 2018-01-08 MED ORDER — ZIPRASIDONE MESYLATE 20 MG IM SOLR
20.0000 mg | Freq: Once | INTRAMUSCULAR | Status: AC
  Administered 2018-01-08: 20 mg via INTRAMUSCULAR

## 2018-01-08 NOTE — ED Notes (Signed)
[facility]/Pending placement 

## 2018-01-08 NOTE — ED Notes (Signed)
Pt sleeping. Meal tray placed on pts bed.

## 2018-01-08 NOTE — ED Notes (Addendum)
Pt woke up from a nap and began pooping on the floor and yelling, "I want to go home!" Pt started smearing the poop all over the room.  Pt was unable to redirect.  Began cursing staff saying, "Get away from me you bitch!"  Pt refused PRN Valium.  ER doctor made aware.  New order received.  Geodon 20 mg IM given.  Staff went in with security officers to clean patient.  After patient received shot and staff cleaned patient and exited the room.  Pt began banging on the glass on the door to his room and peeing all over his room.  Awaiting effectiveness of the shot at this time.  Attempted to contact legal guardian.  Message left for guardian, awaiting a return phone call.

## 2018-01-08 NOTE — ED Notes (Signed)
IV[facility]/  PENDING  PLACEMENT 

## 2018-01-08 NOTE — ED Provider Notes (Signed)
-----------------------------------------   7:06 AM on 01/08/2018 -----------------------------------------   Blood pressure 115/85, pulse 85, temperature 97.9 F (36.6 C), temperature source Oral, resp. rate 17, height 1.549 m (5\' 1" ), weight 68 kg, SpO2 99 %.  The patient had no acute events since last update.    He was agitated overnight and required frequent redirection but did not require medication.  Placement is pending   Loleta RoseForbach, Yolander Goodie, MD 01/08/18 [phone]

## 2018-01-08 NOTE — ED Notes (Signed)
Assisted with ADLs by staff.

## 2018-01-08 NOTE — ED Notes (Signed)
Patient received PM snack. 

## 2018-01-09 NOTE — Progress Notes (Addendum)
Clinical Social Worker (CSW) attempted to contact [facility], Modena SlaterJill McKinney [phone] however she did not answer and a voicemail was left.   CSW received a call back from [place] stating that she has 2 providers from 2 facilities lined up to come assess patient on Wednesday at 10 am and 11 am.   [facility] Tyliek Timberman, LCSW [phone]

## 2018-01-09 NOTE — ED Notes (Signed)
[facility]/Pending placement 

## 2018-01-09 NOTE — ED Notes (Signed)
Pt's guardian Bonita Quin(Linda) called to check on patient. RN informed Bonita QuinLinda the patient needed mediation (IM) early this morning for agitation, but is being cooperative currently.  Bonita QuinLinda stated she is sick and will not vist until Wednesday.

## 2018-01-09 NOTE — ED Notes (Signed)
Pt in the shower at this time. Bed linen changed.

## 2018-01-09 NOTE — ED Provider Notes (Signed)
-----------------------------------------   12:06 AM on 01/09/2018 -----------------------------------------   Blood pressure 115/85, pulse 85, temperature 97.9 F (36.6 C), temperature source Oral, resp. rate 17, height 5\' 1"  (1.549 m), weight 68 kg, SpO2 99 %.  The patient had no acute events since last update.  Calm and cooperative at this time.  Disposition is pending Psychiatry/Behavioral Medicine team recommendations.     Merrily Brittleifenbark, Malcolm Quast, MD 01/09/18 [phone]

## 2018-01-09 NOTE — ED Notes (Signed)
Pt given lunch tray.

## 2018-01-10 MED ORDER — LORAZEPAM 2 MG/ML IJ SOLN
2.0000 mg | Freq: Once | INTRAMUSCULAR | Status: AC
  Administered 2018-01-10: 2 mg via INTRAMUSCULAR
  Filled 2018-01-10: qty 1

## 2018-01-10 MED ORDER — HALOPERIDOL LACTATE 5 MG/ML IJ SOLN
5.0000 mg | Freq: Once | INTRAMUSCULAR | Status: AC
  Administered 2018-01-10: 5 mg via INTRAMUSCULAR
  Filled 2018-01-10: qty 1

## 2018-01-10 MED ORDER — ZIPRASIDONE MESYLATE 20 MG IM SOLR
10.0000 mg | Freq: Once | INTRAMUSCULAR | Status: AC
  Administered 2018-01-11: 10 mg via INTRAMUSCULAR
  Filled 2018-01-10: qty 20

## 2018-01-10 NOTE — ED Notes (Signed)
Pt c/o heartburn. Administered 30 ml of Pepto Bismol PRN per order. Pt tolerated well. No further issue.

## 2018-01-10 NOTE — ED Notes (Signed)
Report from RN. Patient sleeping, respirations regular and unlabored. Q15 minute rounds and observation by Rover and Officer to continue. 

## 2018-01-10 NOTE — ED Notes (Addendum)
Hourly rounding reveals patient in room. No complaints, stable, in no acute distress. Q15 minute rounds and monitoring via Psychologist, counsellingrover and officer to continue.

## 2018-01-10 NOTE — ED Notes (Signed)
Pt sleeping. Lunch tray placed on pt bed 

## 2018-01-10 NOTE — ED Notes (Addendum)
Report received from FloraDorian, EDT. Q15 minute rounding sheet taken over. Patient is currently aggressive. Patient is throwing pillows, behavioral purple pants and shirt at this EDT and other staff. Patient is naked, refusing to put on clothes, and cussing at staff.

## 2018-01-10 NOTE — ED Notes (Signed)
IV[facility]/  PENDING  PLACEMENT 

## 2018-01-10 NOTE — ED Notes (Signed)
Hourly rounding reveals patient in room. Remains aggressive, screaming and shouting, cursing at staff. He is stable, in no acute distress. Q15 minute rounds and observation by Psychologist, counsellingover and officer to continue.

## 2018-01-10 NOTE — ED Notes (Signed)
Pt given supper tray and was made to sit in bed to eat.

## 2018-01-10 NOTE — ED Notes (Signed)
Pt tried to throw supper tray away. This tech caught him and placed it back on his bed. Tray was still full.

## 2018-01-10 NOTE — ED Notes (Signed)
PM Snack and beverage given.

## 2018-01-10 NOTE — ED Notes (Signed)
Pt sleeping. Breakfast tray placed in pt room.

## 2018-01-10 NOTE — ED Provider Notes (Signed)
-----------------------------------------   6:12 AM on 01/10/2018 -----------------------------------------   Blood pressure 115/85, pulse 85, temperature 97.9 F (36.6 C), temperature source Oral, resp. rate 17, height 5\' 1"  (1.549 m), weight 68 kg, SpO2 99 %.  The patient had no acute events since last update.  Calm and cooperative at this time.  Disposition is pending Psychiatry/Behavioral Medicine team recommendations.     Merrily Brittleifenbark, Warrick Llera, MD 01/10/18 [phone]

## 2018-01-10 NOTE — ED Notes (Signed)
Pt sitting in doorway with tray in hallway eating lunch. Pt told he could not sit there and this tech was informed that he was allowed to do that yesterday with RN Amy B.  This tech will enforce rule of pt moving back into his room. Room 20 is concerned that pt in 21 will hurt her when she walks by to go to the bathroom. Pt also in [facility]20H. Pt is belching and blowing nose in hallway while others are eating lunch. Derrick NeedleMichael moved to his bed with this tech telling him he had to.

## 2018-01-10 NOTE — ED Notes (Signed)
Pt started shaking the bed and shouting and screaming at staff. He threw pillow and  his hand to hit staff. When try to calm him down, pt was uncooperative and took off his cloth. MD was notified. Administered 2 mg Ativan and 5 mg Haldol IM per order. Pt attempted to grab staff so he was held down to ensure staff safety while giving medication.

## 2018-01-10 NOTE — ED Notes (Signed)
Pt given supper tray and told he had to remain in bed to eat.

## 2018-01-11 NOTE — ED Notes (Signed)
Hourly rounding reveals patient in room sleeping. No complaints, stable, in no acute distress. Q15 minute rounds and monitoring via Rover and Officer to continue.   

## 2018-01-11 NOTE — ED Notes (Signed)
Hourly rounding reveals patient in room. No complaints, stable, in no acute distress. Q15 minute rounds and monitoring via Rover and Officer to continue.   

## 2018-01-11 NOTE — ED Notes (Signed)
Pt given shower with coaching from this tech. Pt given multiple bath cloths because of pts continued obsession with wiping butt with cloths. Pt cooperative during shower and stating "No poop on walls anymore. That's not nice." Pt assisted with drying off and pt put on clean burgundy scrubs. Pt coached through brushing teeth and rinsing mouth out. This tech also applied lotion to pts hands, feet, arms and legs. This tech also put deodorant on pts underarms.

## 2018-01-11 NOTE — ED Notes (Signed)
Pts meal tray given to sister per her request - she will "make sure he eats".

## 2018-01-11 NOTE — ED Notes (Signed)
[facility] social worker came to room with group home for eval and possible placement

## 2018-01-11 NOTE — ED Notes (Signed)
Pt ambulated to restroom independently, once coming out of the restroom this NT did direct pt to wash hands. Pt washed hands and returned to room.

## 2018-01-11 NOTE — ED Notes (Signed)
Pt given medication and drank 8oz of water

## 2018-01-11 NOTE — ED Notes (Signed)
Pt given graham crackers and juice per his request of being hungry

## 2018-01-11 NOTE — ED Provider Notes (Signed)
-----------------------------------------   6:26 AM on 01/11/2018 -----------------------------------------   Blood pressure 111/69, pulse 92, temperature 98.4 F (36.9 C), temperature source Oral, resp. rate 20, height 5\' 1"  (1.549 m), weight 68 kg, SpO2 100 %.  At the beginning of my shift the patient required IM calming medications as he was agitated, stripped off all of his clothing, cursing and unable to be verbally directed.  This was given with good effect and patient slept the rest of the night without further disruption.  Sleeping currently in no acute distress.  Awaiting psychiatric disposition.    Irean HongSung, Tannis Burstein J, MD 01/11/18 [phone]

## 2018-01-11 NOTE — ED Notes (Signed)
Pt ambulated to restroom independently, the pt was directed by this NT to wash hands after, pt washed hands and returned to room.

## 2018-01-11 NOTE — ED Notes (Addendum)
Pt sister requested to talk to this nurse about pt care (group home staff still in room with pt) - pt sister was extremely agitated because the pt was "not being care for" - she was upset that the pt had gotten medication for agitation last pm - she reports that she was told by social worker that she would be called prior to pt receiving any prn medication - explained to the sister that it was not reasonable to expect staff to leave a violent outburst by the pt to come and call her for permission to administer medication - explained that the safety of the pt and staff were foremost - she reported that pt only "dug in his butt" when he was not cleaned properly - explained to her that pt had dug feces from his rectum and was smearing it on the wall and throwing it at staff - he was also yelling/cursing/throwing pillows/hitting the wall and the tv/shaking the bed - she reports that pt only does that when given Haldol - explained to her that these behaviors were prior to him receiving any extra medication - she reports that she only wants him to receive medication that a psychiatrist prescribes him and that to be called prior to receiving anything extra - she stated that the officers "could hold him down until the medication could kick in" - she was demanding and stated that obviously this nurse did not know how to deal with a Down syndrome pt and that this hospital and its staff were incompetent and had no business caring for this patient - she was upset that the pt had not had a shower yet today - explained that he had been asleep and we were waiting on him to arouse to bathe so as not to upset him - she ask if he had eaten and I said he had refused breakfast - she said he never refuses food - she then ask him if he was was ready to eat lunch and he stated no - she then continued to repeat the entire conversation over again (all of this in front group home employees) - [facility] and Social workerBill RN charge notified of  situation

## 2018-01-11 NOTE — ED Notes (Signed)
Pt. Watching tv in room.  Pt. Calm and cooperative at this time.

## 2018-01-11 NOTE — ED Notes (Addendum)
Pts sister left group home employees in room with the pt and came out to nurses station to speak with this tech. Pts sister asked this tech why pt had feces under his fingernails. This tech informed pts sister that the pt has been playing with feces and throwing it at staff when staff attempts to clean him up. Pts sister then states that it is unacceptable and that she would like to speak to the nurse immediately. This tech called Rosey Batheresa, RN and RN came to speak to sister.  During this dialogue pt can be heard hitting walls and yelling "fucking cunt" and "fucking cunt bitch".

## 2018-01-11 NOTE — Progress Notes (Signed)
LCSW facilitated a visit with Derrick Marshall and potential Care Home. They are meeting with the patient at this time.  LCSW was informed by coordinator that [facility] will be coming at 11am to visit with patient. ED RN was informed and will facilitate patient and coordinator visit.  Delta Air LinesClaudine Ganesh Deeg LCSW [phone]

## 2018-01-11 NOTE — Progress Notes (Signed)
LCSW assisted and facilitated visits between 2 potential facilities. The patients sister became very concerned over care for her brother and a lengthy discussion ensued. LCSW voiced concerns to ED Charge ,ED RN and ED tech aid. After several sub discussions, the general consensus the patient is receiving excellent care. This LCSW spoke to patients sister/guardian and expressed that staying in the ED is not a good environment ( for patient)  and again LCSW advocated to Care Coordinator with [facility] to continue to find suitable housing for this patient.  Noreene LarssonJill will update this LCSW after meetings today.  Delta Air LinesClaudine Iveth Heidemann LCSW [phone]

## 2018-01-11 NOTE — ED Notes (Signed)
Pts room mopped by EVS and pts clothes and linens changed.

## 2018-01-11 NOTE — ED Notes (Signed)
Hourly rounding reveals patient in room. Pt is calm and lying down in his bed. He is stable, in no acute distress. Q15 minute rounds and monitoring via Psychologist, counsellingover and Officer to continue.

## 2018-01-11 NOTE — ED Notes (Signed)
Pt asleep, breakfast tray placed in rm.  

## 2018-01-11 NOTE — ED Notes (Signed)
Pt began hitting wall, hitting tv, yelling, and pulling pants off (in the presence of sister and group home staff)

## 2018-01-11 NOTE — ED Notes (Signed)
[facility] social worker escorted group home employee and pt sister to the room to eval for placement

## 2018-01-11 NOTE — ED Notes (Signed)
Pt hollering out, officers had pt shut the door to the room.

## 2018-01-12 NOTE — ED Notes (Signed)
Pt. In bathroom, trying to have bowl movement.  Pt. Moaning in bathroom.

## 2018-01-12 NOTE — ED Notes (Signed)
Pt given lunch tray. Sister visiting with pt.

## 2018-01-12 NOTE — ED Notes (Signed)
Pt sleeping. Breakfast tray delivered and placed in pt room.

## 2018-01-12 NOTE — ED Notes (Signed)
Patient is yelling, "I'm moving" , but Patient did take all of His medications without difficulty, eating His breakfast at this time, will continue to monitor.

## 2018-01-12 NOTE — ED Notes (Signed)
Sister left with no issues. Pt sitting on bed at this time repeating that he is moving.

## 2018-01-12 NOTE — ED Notes (Signed)
Pt coming into hallway stating he is moving. Pt redirected back to room and is now sitting on bed talking.

## 2018-01-12 NOTE — ED Notes (Signed)
Pt. Comes out of bathroom announced to everyone in loud voice "I pooed".  Pt. Re-directed back to room.

## 2018-01-12 NOTE — Progress Notes (Signed)
L[facility] to [facility] agreed to take the patient and has given an admit date of OCT 4th.2019. [facility] is working with [facility] to take patient as early as next week- They have a viewing on Tuesday. Noreene LarssonJill will be calling [name] now to see if they will accept patient back ( RESPITE bed) and will call this worker back today.   [employer] Air LinesClaudine Cody Oliger LCSW [phone]

## 2018-01-12 NOTE — ED Notes (Signed)
Pt took medication without issue. Remains calm and cooperative at this time.

## 2018-01-12 NOTE — ED Provider Notes (Signed)
-----------------------------------------   4:54 AM on 01/12/2018 -----------------------------------------   Blood pressure 107/85, pulse 97, temperature 98.3 F (36.8 C), temperature source Oral, resp. rate 16, height 5\' 1"  (1.549 m), weight 68 kg, SpO2 100 %.  The patient had no acute events since last update.  Calm and cooperative at this time.  Disposition is pending Psychiatry/Behavioral Medicine team recommendations.     Merrily Brittleifenbark, Dick Hark, MD 01/12/18 [phone]

## 2018-01-13 MED ORDER — ZIPRASIDONE MESYLATE 20 MG IM SOLR
10.0000 mg | Freq: Once | INTRAMUSCULAR | Status: AC
  Administered 2018-01-13: 10 mg via INTRAMUSCULAR
  Filled 2018-01-13: qty 20

## 2018-01-13 NOTE — ED Notes (Signed)
Patient sleeping soundly at this time.  Attempted to wake patient to give medications but patient is not easily arousable at this time.

## 2018-01-13 NOTE — ED Notes (Signed)
Pt given breakfast tray

## 2018-01-13 NOTE — ED Notes (Signed)
Patient requesting nicotine patch at this time.

## 2018-01-13 NOTE — ED Provider Notes (Addendum)
-----------------------------------------   6:13 AM on 01/13/2018 -----------------------------------------   Blood pressure 107/68, pulse 77, temperature 98.6 F (37 C), temperature source Oral, resp. rate 16, height 5\' 1"  (1.549 m), weight 68 kg, SpO2 100 %.  The patient had no acute events since last update.  Calm and cooperative at this time.  Disposition is pending Psychiatry/Behavioral Medicine team recommendations.     Irean HongSung, Jade J, MD 01/13/18 [phone]  ----------------------------------------- 7:08 AM on 01/13/2018 -----------------------------------------  Patient went to the restroom and began to escalate his behavior.  Smeared feces on the wall.  He is banging staff on the floor and walls.  Unable to be verbally directed.  Requires IM calming medication.    Irean HongSung, Jade J, MD 01/13/18 [phone]

## 2018-01-13 NOTE — ED Notes (Signed)
Pt. Sitting on floor at entrance of room.  Pt. States "I'm going to a new home".  Pt. Appears excited about going to a new group home.  Pt. Calm and cooperative at this time.

## 2018-01-13 NOTE — ED Notes (Signed)
Spoke to pharmacy about giving pt his 1000 meds that he missed due to being asleep.  Pharmacist said to give the Cogentin now and hold the Haloperidol and Colace until next dose due.

## 2018-01-13 NOTE — ED Notes (Signed)
Patient is sitting in the doorway of his room.  Patient is laughing occasionally and seems generally cheery.  Patient states he is happy about getting a new house.

## 2018-01-13 NOTE — ED Notes (Addendum)
Pt up to the bathroom approximately 10 mins ago and now is in the bathroom with all of his clothes off. Pt has had a BM and is now smearing stool on the walls. Pt also stuck his hand in the toilet and threw toilet water on the EDT. Pt yelling and pointing his finger at staff when they try to redirect him.  Dr Dolores FrameSung informed and will give orders.

## 2018-01-13 NOTE — ED Notes (Signed)
Pt redressed by this EDT and Tobi BastosAnna, RN. Pt cooperative with dressing.

## 2018-01-13 NOTE — ED Notes (Signed)
Patient is now awake and is ambulatory to restroom.

## 2018-01-14 MED ORDER — ZIPRASIDONE MESYLATE 20 MG IM SOLR
10.0000 mg | Freq: Once | INTRAMUSCULAR | Status: AC
  Administered 2018-01-14: 10 mg via INTRAMUSCULAR
  Filled 2018-01-14: qty 20

## 2018-01-14 NOTE — ED Notes (Signed)
Pt. Sleeping in bed.  Pt. Was given medication around 5 pm for yelling outside of room.

## 2018-01-14 NOTE — ED Notes (Addendum)
Hourly rounding reveals patient sleeping in room. No complaints, stable, in no acute distress. Q15 minute rounds and monitoring via Security to continue. 

## 2018-01-14 NOTE — ED Notes (Signed)
Patient taking a shower.

## 2018-01-14 NOTE — ED Notes (Signed)
Pt. Up and currently using bathroom.

## 2018-01-14 NOTE — ED Notes (Signed)
Patient given his 1400 Valium 10mg , patient continues to scream, and yell very loudly, " im going home". Patient continues to need constant redirection to talk quietly but patient continues to scream excitedly and loudly.

## 2018-01-14 NOTE — ED Provider Notes (Signed)
-----------------------------------------   7:06 AM on 01/14/2018 -----------------------------------------   Blood pressure (!) 111/94, pulse (!) 101, temperature 98.6 F (37 C), temperature source Oral, resp. rate (!) 22, height 5\' 1"  (1.549 m), weight 68 kg, SpO2 100 %.  The patient had no acute events since last update.  Calm and cooperative at this time.  Disposition is pending Psychiatry/Behavioral Medicine team recommendations.     Willy Eddyobinson, Allin Frix, MD 01/14/18 [phone]

## 2018-01-14 NOTE — ED Notes (Signed)
Patient sitting on floor at room door and screaming excitedly "I'm going to a new home", "i'll miss you honey". Patient appears to be ready to go to a new group home. Patient needs redirection to whisper and talk quietly. Patient appropriate and cooperative.

## 2018-01-14 NOTE — ED Notes (Signed)
Pt. Is awake and asking to go to new group home.  Pt. Is very tired.  Pt. Directed back in bed. One on one safety sitter requested.

## 2018-01-15 MED ORDER — ACETAMINOPHEN 325 MG PO TABS
650.0000 mg | ORAL_TABLET | Freq: Once | ORAL | Status: AC
  Administered 2018-01-15: 650 mg via ORAL
  Filled 2018-01-15: qty 2

## 2018-01-15 MED ORDER — BACITRACIN-NEOMYCIN-POLYMYXIN 400-5-5000 EX OINT
TOPICAL_OINTMENT | CUTANEOUS | Status: AC
  Filled 2018-01-15: qty 1

## 2018-01-15 MED ORDER — ZIPRASIDONE MESYLATE 20 MG IM SOLR
10.0000 mg | Freq: Once | INTRAMUSCULAR | Status: AC
  Administered 2018-01-15: 10 mg via INTRAMUSCULAR
  Filled 2018-01-15: qty 20

## 2018-01-15 NOTE — ED Notes (Signed)
Pt. Up using bathroom. 

## 2018-01-15 NOTE — ED Provider Notes (Signed)
-----------------------------------------   6:38 AM on 01/15/2018 -----------------------------------------   Blood pressure 113/76, pulse 77, temperature 98 F (36.7 C), temperature source Oral, resp. rate 16, height 5\' 1"  (1.549 m), weight 68 kg, SpO2 99 %.  The patient had no acute events since last update.  Calm and cooperative at this time.  Disposition is pending Psychiatry/Behavioral Medicine team recommendations.     Irean HongSung, Amadou Katzenstein J, MD 01/15/18 [phone]

## 2018-01-15 NOTE — ED Notes (Signed)
Pt. Continues to make noise at entrance of doorway, pt. Directed back into room on bed.  Pt. Currently sitting on bed.

## 2018-01-15 NOTE — ED Notes (Signed)
Patient continues to yell and scream and keeps the other patients up, that the other patients are becoming frustrated. Patient yells "im going home", and repeatedly belches loud. Patient needs constant redirection to whisper, patient screams then says "im sorry", "one more chance"  Staff asked patient to not sit at the door entrance, due to privacy for the other patients who are in the hall beds, and cant sleep because patient is very loud

## 2018-01-15 NOTE — ED Notes (Signed)
Patient has been walking out into the hallway, yelling and is very hyperverbal. Attempting to maintain safety due to patient continues to walk in other patients face. Patient given Geodon 10mg  IM.

## 2018-01-15 NOTE — ED Notes (Signed)
BEHAVIORAL HEALTH ROUNDING Patient sleeping: No. Patient alert and oriented: yes Behavior appropriate: Yes.  ; If no, describe:  Nutrition and fluids offered: yes Toileting and hygiene offered: Yes  Sitter present: q15 minute observations and security monitoring Law enforcement present: Yes    

## 2018-01-15 NOTE — ED Notes (Signed)
Pt. Sitting at door entrance to room, where the patient will talk loudly about going to new group home.  Pt. Repeatedly has to be reminded to be mindful of other patients sleeping.

## 2018-01-15 NOTE — ED Notes (Signed)
Patient eating breakfast. °

## 2018-01-15 NOTE — ED Notes (Signed)
PT IVC/ PENDING PLACEMENT  

## 2018-01-15 NOTE — ED Notes (Signed)
Pt given lunch tray.

## 2018-01-16 MED ORDER — ZIPRASIDONE MESYLATE 20 MG IM SOLR
5.0000 mg | Freq: Once | INTRAMUSCULAR | Status: AC
  Administered 2018-01-16: 5 mg via INTRAMUSCULAR
  Filled 2018-01-16: qty 20

## 2018-01-16 NOTE — ED Notes (Signed)
BEHAVIORAL HEALTH ROUNDING Patient sleeping: No. Patient alert and oriented: yes Behavior appropriate: Yes.  ; If no, describe:  Nutrition and fluids offered: yes Toileting and hygiene offered: Yes  Sitter present: q15 minute observations and security  monitoring Law enforcement present: Yes  ODS  

## 2018-01-16 NOTE — ED Notes (Addendum)
Pt facial hair shaved by this tech.

## 2018-01-16 NOTE — ED Notes (Signed)
Pt currently being shaved by Kennith Centerracey  EDT

## 2018-01-16 NOTE — ED Notes (Signed)
Pt given lunch tray.

## 2018-01-16 NOTE — ED Notes (Signed)
Shower provided  - assisted pt with dressing and getting himself clean  - teeth brushed  Deodorant applied   Linens changed  Floors mopped and all items in room cleansed with purple wipes

## 2018-01-16 NOTE — ED Notes (Signed)
ED  Is the patient under IVC or is there intent for IVC: Yes.   Is the patient medically cleared: Yes.   Is there vacancy in the ED BHU: Yes.   Is the population mix appropriate for patient: Yes.   Is the patient awaiting placement in inpatient or outpatient setting: Yes.   Has the patient had a psychiatric consult: Yes.   Survey of unit performed for contraband, proper placement and condition of furniture, tampering with fixtures in bathroom, shower, and each patient room: Yes.  ; Findings:  APPEARANCE/BEHAVIOR Calm and cooperative NEURO ASSESSMENT Orientation: oriented to self, place   Denies pain Hallucinations: No.None noted (Hallucinations) denies  Speech: Normal Gait: normal RESPIRATORY ASSESSMENT Even  Unlabored respirations  CARDIOVASCULAR ASSESSMENT Pulses equal   regular rate  Skin warm and dry   GASTROINTESTINAL ASSESSMENT no GI complaint EXTREMITIES Full ROM  PLAN OF CARE Provide calm/safe environment. Vital signs assessed twice daily. ED BHU Assessment once each 12-hour shift. Collaborate with TTS  daily or as condition indicates. Assure the ED provider has rounded once each shift. Provide and encourage hygiene. Provide redirection as needed. Assess for escalating behavior; address immediately and inform ED provider.  Assess family dynamic and appropriateness for visitation as needed: Yes.  ; If necessary, describe findings:  Educate the patient/family about BHU procedures/visitation: Yes.  ; If necessary, describe findings:

## 2018-01-16 NOTE — Progress Notes (Signed)
Sept 23/19  LCSW called patient guardian Derrick Marshall  to discuss a possible placement for her brother. LCSW shared information that Derrick Marshall [phone]  [address] [name] and would like to meet with her brother tomorrow. Left information in voice mail.  LCSW will [facility] on Wednesday

## 2018-01-16 NOTE — ED Notes (Signed)
Patient sleeping

## 2018-01-16 NOTE — ED Notes (Signed)
Pt with a visit from his sister - guardian  Bonita QuinLinda  Pt happy to see her  NAD observed during visit  Continue to monitor

## 2018-01-16 NOTE — ED Notes (Signed)
Patient agitated, cursing at staff.

## 2018-01-16 NOTE — ED Notes (Signed)
Pt observed with no unusual behavior  Appropriate to stimulation  No verbalized needs or concerns at this time  NAD assessed  Continue to monitor 

## 2018-01-16 NOTE — ED Provider Notes (Signed)
Vitals:   01/14/18 2118 01/15/18 2014  BP: 113/76 121/88  Pulse: 77 95  Resp: 16 18  Temp: 98 F (36.7 C) 97.6 F (36.4 C)  SpO2: 99% 100%   No events overnight, patient remains medically stable   Emily FilbertWilliams, Karia Ehresman E, MD 01/16/18 [phone]

## 2018-01-16 NOTE — ED Notes (Signed)

## 2018-01-16 NOTE — ED Notes (Addendum)
Pt c/o CP and dizziness. Amy,RN made aware of this and an EKG was preformed.

## 2018-01-16 NOTE — ED Notes (Signed)
Received a call from his guardian - Bonita QuinLinda  Update provided  Bonita QuinLinda talked for awhile and vented about the care that Derrick Marshall has received here  -   Sounded more relaxed after being able to talk out her frustrations   Continue to monitor pt closely

## 2018-01-16 NOTE — ED Notes (Signed)
Patient yelling, "I want to go home!"  Patient then knocked trash can over.

## 2018-01-16 NOTE — Progress Notes (Signed)
LCSW called guardian has agreed that [employer]eps[employer]Sandra Kilogore [phone]  Delta Air LinesClaudine Prima Rayner LCSW [phone]

## 2018-01-17 DIAGNOSIS — F840 Autistic disorder: Secondary | ICD-10-CM | POA: Diagnosis not present

## 2018-01-17 MED ORDER — HALOPERIDOL 2 MG PO TABS
3.0000 mg | ORAL_TABLET | Freq: Two times a day (BID) | ORAL | Status: DC
  Administered 2018-01-17 – 2018-01-20 (×6): 3 mg via ORAL
  Filled 2018-01-17 (×8): qty 1

## 2018-01-17 MED ORDER — TRAZODONE HCL 100 MG PO TABS
100.0000 mg | ORAL_TABLET | Freq: Once | ORAL | Status: AC
  Administered 2018-01-17: 100 mg via ORAL
  Filled 2018-01-17: qty 1

## 2018-01-17 MED ORDER — TRAZODONE HCL 100 MG PO TABS
100.0000 mg | ORAL_TABLET | Freq: Every day | ORAL | Status: DC
  Administered 2018-01-17 – 2018-01-20 (×4): 100 mg via ORAL
  Filled 2018-01-17 (×4): qty 1

## 2018-01-17 NOTE — ED Notes (Signed)
Pt given supper tray.

## 2018-01-17 NOTE — ED Notes (Signed)
Hourly rounding reveals patient in and out of his room. Redirection minimally effective. Occasional loud outbursts. Pt. Will respond briefly to redirection. No complaints, stable, in no acute distress. Q15 minute rounds and monitoring via Psychologist, counsellingover and Officer to continue.

## 2018-01-17 NOTE — ED Notes (Signed)
Report received from Lever[place]ann, [place]. Q15 minute rounding sheet taken over. Patient is currently sitting on the floor outside of his room talking to the officer. Will continue to monitor.

## 2018-01-17 NOTE — ED Notes (Signed)
BEHAVIORAL HEALTH ROUNDING Patient sleeping: No. Patient alert and oriented: yes Behavior appropriate: Yes.  ; If no, describe:  Nutrition and fluids offered: yes Toileting and hygiene offered: Yes  Sitter present: q15 minute observations and security  monitoring Law enforcement present: Yes  ODS  

## 2018-01-17 NOTE — ED Notes (Signed)
Pt observed with no unusual behavior  Appropriate to stimulation  No verbalized needs or concerns at this time  NAD assessed  Continue to monitor 

## 2018-01-17 NOTE — ED Notes (Signed)
Report to include Situation, Background, Assessment, and Recommendations received from Amy Teague RN. Patient alert, warm and dry, in no acute distress.  Patient made aware of Q15 minute rounds and Rover and Officer presence for their safety. Patient instructed to come to me with needs or concerns.  

## 2018-01-17 NOTE — ED Notes (Addendum)
Pt assisted with showering by this tech. Pt observed putting on deodorant and this tech put lotion on pts hands, feet, arms and legs. Pt changed out into clean scrubs and socks. Pt observed brushing teeth. Pts hands and fingernails scrubbed clean with new clean toothbrush and soap.

## 2018-01-17 NOTE — ED Notes (Signed)
Pt given meal tray and observed eating.

## 2018-01-17 NOTE — Progress Notes (Signed)
[place] Tuesday and is interested in taking him in her home. Per Dois DavenportSandra she will have to get a single contract agreement from [facility]. CSW left [facility] Coordinator a voicemail.  [employer] IncorporatedBailey Iniya Matzek, LCSW [phone]

## 2018-01-17 NOTE — ED Notes (Signed)
Hourly rounding reveals patient in room occasionally yelling out. Stable, in no acute distress. Q15 minute rounds and monitoring via Psychologist, counsellingover and Officer to continue.

## 2018-01-17 NOTE — ED Notes (Signed)
Hourly rounding reveals patient sleeping in room. No complaints, stable, in no acute distress. Q15 minute rounds and monitoring via Rover and Officer to continue.  

## 2018-01-17 NOTE — ED Notes (Signed)

## 2018-01-17 NOTE — Consult Note (Signed)
[facility] Face-to-Face Psychiatry Consult   Reason for Consult: Follow-up consult for this 37 year old man with Down syndrome and autistic spectrum disorder. Referring Physician: Pershing Proud Patient Identification: Junie Engram MRN:  [id number] Principal Diagnosis: Autistic spectrum disorder Diagnosis:   Patient Active Problem List   Diagnosis Date Noted  . Autistic spectrum disorder [F84.0] 12/12/2017  . 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 Agitation [R45.1] 12/12/2017    Total Time spent with patient: 20 minutes  Subjective:   Sharvil Hoey is a 37 y.o. male patient admitted with patient has no specific complaint.  HPI: See previous notes.  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  He lost his place at the last living arrangement that he had and social work has been so far up till this point unable to find a new place for him to stay.  Patient's behavior has been fairly stable.  Occasionally gets agitated but has grown used to [place] here and is generally redirectable.  Not acting out violently anymore.  Patient's verbal skills are impaired to the point that I can understand almost nothing that he says.  I am told that his sister, who is his guardian, visited today and suggested that we decrease his dose of Ativan to 3 mg.  Past Psychiatric History: Lifelong impairment obviously with a long history of major behavior problems that have precluded independent living or safe placement.  Multiple medications have been tried along with a lot of effort at social and environmental therapy  Risk to Self: Suicidal Ideation: No Suicidal Intent: No Is patient at risk for suicide?: No Suicidal Plan?: No Access to Means: No What has been your use of drugs/alcohol within the last 12 months?: None Triggers for Past Attempts: (UTA - Unable to  Assess) Intentional Self Injurious Behavior: (UTA - Unable to Assess) Risk to Others: Homicidal Ideation: No Thoughts of Harm to Others: No Current Homicidal Intent: No Current Homicidal Plan: No Access to Homicidal Means: No History of harm to others?: No Does patient have access to weapons?: No Criminal Charges Pending?: No Does patient have a court date: No Prior Inpatient Therapy: Prior Inpatient Therapy: (UTA - Unable to Assess) Prior Outpatient Therapy: Prior Outpatient Therapy: (UTA - Unable to Assess)  Past Medical History:  Past Medical History:  Diagnosis Date  . Autism   . Down syndrome    History reviewed. No pertinent surgical history. Family History: No family history on file. Family Psychiatric  History: None Social History:  Social History   Substance and Sexual Activity  Alcohol Use Never  . Frequency: Never     Social History   Substance and Sexual Activity  Drug Use Never    Social History   Socioeconomic History  . Marital status: Single    Spouse name: Not on file  . Number of children: Not on file  . Years of education: Not on file  . Highest education level: Not on file  Occupational History  . Not on file  Social Needs  . Financial resource strain: Not on file  . Food insecurity:    Worry: Not on file    Inability: Not on file  . Transportation needs:    Medical: Not on file    Non-medical: Not on file  Tobacco Use  . Smoking status: Never Smoker  . Smokeless tobacco: Never Used  Substance and Sexual Activity  .  Alcohol use: Never    Frequency: Never  . Drug use: Never  . Sexual activity: Not on file  Lifestyle  . Physical activity:    Days per week: Not on file    Minutes per session: Not on file  . Stress: Not on file  Relationships  . Social connections:    Talks on phone: Not on file    Gets together: Not on file    Attends religious service: Not on file    Active member of club or organization: Not on file    Attends  meetings of clubs or organizations: Not on file    Relationship status: Not on file  Other Topics Concern  . Not on file  Social History Narrative  . Not on file   Additional Social History:    Allergies:   Allergies  Allergen Reactions  . Celexa [Citalopram Hydrobromide] Other (See Comments)    Aggressive   . Zyprexa [Olanzapine] Other (See Comments)    Aggressive behavior     Labs: No results found for this or any previous visit (from the past 48 hour(s)).  Current Facility-Administered Medications  Medication Dose Route Frequency Provider Last Rate Last Dose  . benztropine (COGENTIN) tablet 1 mg  1 mg Oral BID Minna AntisPaduchowski, Kevin, MD   1 mg at 01/17/18 0956  . bismuth subsalicylate (PEPTO BISMOL) 262 MG/15ML suspension 30 mL  30 mL Oral Q4H PRN Arnaldo NatalMalinda, Paul F, MD   30 mL at 01/17/18 0957  . diazepam (VALIUM) tablet 10 mg  10 mg Oral Q1400 Clapacs, Jackquline DenmarkJohn T, MD   10 mg at 01/17/18 1402  . diazepam (VALIUM) tablet 10 mg  10 mg Oral Q8H PRN Clapacs, Jackquline DenmarkJohn T, MD   10 mg at 01/17/18 0956  . docusate sodium (COLACE) capsule 100 mg  100 mg Oral BID Minna AntisPaduchowski, Kevin, MD   100 mg at 01/17/18 0957  . famotidine (PEPCID) tablet 20 mg  20 mg Oral BID AC Minna AntisPaduchowski, Kevin, MD   20 mg at 01/17/18 0957  . haloperidol (HALDOL) tablet 3 mg  3 mg Oral BID Clapacs, John T, MD      . traZODone (DESYREL) tablet 100 mg  100 mg Oral QHS Darci CurrentBrown, [place] N, MD       Current Outpatient Medications  Medication Sig Dispense Refill  . benztropine (COGENTIN) 1 MG tablet Take 1 mg by mouth 2 (two) times daily.    . bisacodyl (DULCOLAX) 10 MG suppository Place 10 mg rectally as needed for moderate constipation. Insert 1 suppository per rectum if no bowel movements in 48 hours    . chlorhexidine (HIBICLENS) 4 % external liquid Apply 1 application topically once a week. Shower from head to toe weekly    . diazepam (VALIUM) 5 MG tablet Take 1 tablet (5 mg total) by mouth daily at 2 PM. 30 tablet 0  .  docusate sodium (COLACE) 100 MG capsule Take 100 mg by mouth 2 (two) times daily.    . famotidine (PEPCID) 20 MG tablet Take 20 mg by mouth 2 (two) times daily before a meal.    . haloperidol (HALDOL) 1 MG tablet Take 3 mg by mouth 2 (two) times daily. In the morning and at 1630    . miconazole (MICOTIN) 2 % powder Apply topically daily. Apply powder to groin, abdomen and buttocks daily after shower    . Miconazole Nitrate (ALOE VESTA ANTIFUNGAL) 2 % OINT Apply 1 application topically 2 (two) times daily as needed (itching).  Apply to groin area    . Multiple Vitamin (MULTIVITAMIN WITH MINERALS) TABS tablet Take 1 tablet by mouth daily.    . Skin Protectants, Misc. (EUCERIN) cream Apply 1 application topically daily.     . vitamin B-12 (CYANOCOBALAMIN) 500 MCG tablet Take 1,000 mcg by mouth 2 (two) times daily.    . Vitamin D, Ergocalciferol, (DRISDOL) 50000 units CAPS capsule Take 50,000 Units by mouth every 7 (seven) days.    Marland Kitchen ammonium lactate (LAC-HYDRIN) 12 % lotion Apply 1 application topically daily. Apply lotion to legs and chest daily after shower      Musculoskeletal: Strength & Muscle Tone: within normal limits Gait & Station: normal Patient leans: N/A  Psychiatric Specialty Exam: Physical Exam  Nursing note and vitals reviewed. Constitutional: He appears well-developed and well-nourished.  HENT:  Head: Normocephalic and atraumatic.  Eyes: Pupils are equal, round, and reactive to light. Conjunctivae are normal.  Neck: Normal range of motion.  Cardiovascular: Regular rhythm and normal heart sounds.  Respiratory: Effort normal. No respiratory distress.  GI: Soft.  Musculoskeletal: Normal range of motion.  Neurological: He is alert.  Skin: Skin is warm and dry.  Psychiatric: His affect is labile and inappropriate. He is agitated. He is not aggressive. Cognition and memory are impaired. He expresses impulsivity and inappropriate judgment. He is noncommunicative.    Review of  Systems  Unable to perform ROS: Mental acuity    Blood pressure 110/72, pulse 82, temperature 98 F (36.7 C), temperature source Oral, resp. rate 18, height 5\' 1"  (1.549 m), weight 68 kg, SpO2 98 %.Body mass index is 28.33 kg/m.  General Appearance: Casual  Eye Contact:  Fair  Speech:  Garbled and Slurred  Volume:  Decreased  Mood:  Euthymic  Affect:  Constricted  Thought Process:  Disorganized  Orientation:  Negative  Thought Content:  Illogical and Tangential  Suicidal Thoughts:  No  Homicidal Thoughts:  No  Memory:  Negative  Judgement:  Negative  Insight:  Negative  Psychomotor Activity:  Negative  Concentration:  Concentration: Negative  Recall:  Negative  Fund of Knowledge:  Negative  Language:  Negative  Akathisia:  Negative  Handed:  Right  AIMS (if indicated):     Assets:  Social Support  ADL's:  Impaired  Cognition:  Impaired,  Moderate and Severe  Sleep:        Treatment Plan Summary: Medication management and Plan Patient has generally been settled down to the point where he is little difficulty at this point.  Has not been acting out violently recently.  Not expressing any specific complaints or showing signs of any major side effects.  Sister would like [place] to try decreasing his haloperidol dose.  I have never been convinced that the doses of the medicine were very crucial to any improvement he is showing and I am happy to try it if that is what she would prefer.  Case reviewed with nursing.  Decrease Haldol dose to 3 mg from 5 mg twice a day.  Continue monitoring as social work continues to try to find appropriate placement.  Disposition: No evidence of imminent risk to self or others at present.   Patient does not meet criteria for psychiatric inpatient admission.  Mordecai Rasmussen, MD 01/17/2018 4:47 PM

## 2018-01-17 NOTE — ED Notes (Signed)
ED  Is the patient under IVC or is there intent for IVC: Yes.   Is the patient medically cleared: Yes.   Is there vacancy in the ED BHU: Yes.   Is the population mix appropriate for patient: Yes.   Is the patient awaiting placement in inpatient or outpatient setting: Yes.   Has the patient had a psychiatric consult: Yes.   Survey of unit performed for contraband, proper placement and condition of furniture, tampering with fixtures in bathroom, shower, and each patient room: Yes.  ; Findings:  APPEARANCE/BEHAVIOR Calm and cooperative NEURO ASSESSMENT Orientation: oriented to self  Denies pain Hallucinations: No.None noted (Hallucinations) Speech: Normal Gait: normal RESPIRATORY ASSESSMENT Even  Unlabored respirations  CARDIOVASCULAR ASSESSMENT Pulses equal   regular rate  Skin warm and dry   GASTROINTESTINAL ASSESSMENT no GI complaint EXTREMITIES Full ROM  PLAN OF CARE Provide calm/safe environment. Vital signs assessed twice daily. ED BHU Assessment once each 12-hour shift. Collaborate with TTS daily or as condition indicates. Assure the ED provider has rounded once each shift. Provide and encourage hygiene. Provide redirection as needed. Assess for escalating behavior; address immediately and inform ED provider.  Assess family dynamic and appropriateness for visitation as needed: Yes.  ; If necessary, describe findings:  Educate the patient/family about BHU procedures/visitation: Yes.  ; If necessary, describe findings:

## 2018-01-18 LAB — CBC
HEMATOCRIT: 42.9 % (ref 40.0–52.0)
Hemoglobin: 14.7 g/dL (ref 13.0–18.0)
MCH: 36.9 pg — AB (ref 26.0–34.0)
MCHC: 34.2 g/dL (ref 32.0–36.0)
MCV: 107.9 fL — AB (ref 80.0–100.0)
Platelets: 149 10*3/uL — ABNORMAL LOW (ref 150–440)
RBC: 3.98 MIL/uL — AB (ref 4.40–5.90)
RDW: 16.5 % — ABNORMAL HIGH (ref 11.5–14.5)
WBC: 4.8 10*3/uL (ref 3.8–10.6)

## 2018-01-18 LAB — BASIC METABOLIC PANEL
ANION GAP: 10 (ref 5–15)
BUN: 12 mg/dL (ref 6–20)
CHLORIDE: 100 mmol/L (ref 98–111)
CO2: 27 mmol/L (ref 22–32)
Calcium: 9.2 mg/dL (ref 8.9–10.3)
Creatinine, Ser: 0.91 mg/dL (ref 0.61–1.24)
GFR calc Af Amer: >60 mL/min (ref >60)
GFR calc non Af Amer: >60 mL/min (ref >60)
GLUCOSE: 114 mg/dL — AB (ref 70–99)
Potassium: 4 mmol/L (ref 3.5–5.1)
Sodium: 137 mmol/L (ref 135–145)

## 2018-01-18 NOTE — Progress Notes (Signed)
LCSW called [facility] SlaterJill  [facility] [employer] [phone] LCSW was informed that patients guardian and [facility] coordinator has [facility] and [facility] has been accepted. NO FIRM DC DATE established  [facility] date of [facility] now.... She needs to hire staff for 1-1 support for patient before he can go. I requested the patient wait at [facility] for the interim for their hiring and paper work delays and Social research officer, governmentmanagerial approvals. She will call and see if that can be arranged. LCSW advocated "hard" as [facility] not forth coming and not  being proactive in the care of her patient ( Reviewing 1-1 suport worker resumes)  I reviewed that another Care home from [place] would take him- I was informed that group home is Not CARDINAL and it would take 6 months [facility].

## 2018-01-18 NOTE — ED Notes (Signed)
Hourly rounding reveals patient sleeping in room. No complaints, stable, in no acute distress. Q15 minute rounds and monitoring via Rover and Officer to continue.  

## 2018-01-18 NOTE — ED Notes (Signed)
Pt sleeping. Breakfast tray placed at bedside 

## 2018-01-18 NOTE — ED Notes (Signed)
Pt woke up and went to the bathroom.  Pt came out of the bathroom

## 2018-01-18 NOTE — ED Notes (Signed)
Pt sleeping. Pts meal tray placed on bed. Pt not woken because of his lack of sleep the past few days.

## 2018-01-18 NOTE — ED Notes (Signed)
Pt. Sitting on bed watching tv.

## 2018-01-18 NOTE — ED Notes (Signed)
Pt woke up and went to the bathroom.  Gait was steady.  Pt came out of the bathroom and stated, "I'm dizzy.  Pt was assisted to the floor by Engineer, materialssecurity officer and nurse.  No injuries noted.  Pt's vital signs were taken and documented.  MD, Dr. Demetrius CharityP, made aware.  MD ordered CBC and BMP.  MD to evaluate patient.  Pt continues to state, "I don't feel good.  I want to go to the hospital."

## 2018-01-18 NOTE — ED Notes (Signed)
Pt's guardian, Bonita Quin, noted of fall.

## 2018-01-18 NOTE — ED Notes (Signed)
Pt still asleep at this time.  It was reported by night shift nurse that patient had been awake for 24+ hours a couple days ago.  Have allowed patient to sleep and have not attempted to awaken patient at this time for medications.

## 2018-01-18 NOTE — ED Notes (Signed)
Hourly rounding reveals patient in room. No complaints, stable, in no acute distress. Q15 minute rounds and monitoring via Rover and Officer to continue.   

## 2018-01-18 NOTE — ED Notes (Signed)
PT IVC/ PENDING PLACEMENT  

## 2018-01-18 NOTE — ED Provider Notes (Signed)
-----------------------------------------   5:26 PM on 01/18/2018 -----------------------------------------  Patient complaining of some dizziness, currently sitting on the floor.  Per nurse did appear somewhat pale when he woke up.  We will check basic labs, CBC and BMP as it has been several weeks since last blood work performed.  Patient is eating his food, overall appears well without distress.   ----------------------------------------- 6:43 PM on 01/18/2018 -----------------------------------------  Repeat labs are largely within normal limits.  Patient appears to be acting much better at this time.  We will continue to closely monitor.   Minna Antis, MD 01/18/18 1843

## 2018-01-19 LAB — GLUCOSE, CAPILLARY: GLUCOSE-CAPILLARY: 91 mg/dL (ref 70–99)

## 2018-01-19 NOTE — ED Notes (Signed)
ED  Is the patient under IVC or is there intent for IVC: Yes.   Is the patient medically cleared: Yes.   Is there vacancy in the ED BHU: Yes.   Is the population mix appropriate for patient: Yes.   Is the patient awaiting placement in inpatient or outpatient setting: Yes.   Has the patient had a psychiatric consult: Yes.   Survey of unit performed for contraband, proper placement and condition of furniture, tampering with fixtures in bathroom, shower, and each patient room: Yes.  ; Findings:  APPEARANCE/BEHAVIOR Calm and cooperative NEURO ASSESSMENT Orientation: oriented to self  Denies pain Hallucinations: No.None noted (Hallucinations) Speech: Normal Gait: normal RESPIRATORY ASSESSMENT Even  Unlabored respirations  CARDIOVASCULAR ASSESSMENT Pulses equal   regular rate  Skin warm and dry   GASTROINTESTINAL ASSESSMENT no GI complaint EXTREMITIES Full ROM  PLAN OF CARE Provide calm/safe environment. Vital signs assessed twice daily. ED BHU Assessment once each 12-hour shift. Collaborate with TTS when available or as condition indicates. Assure the ED provider has rounded once each shift. Provide and encourage hygiene. Provide redirection as needed. Assess for escalating behavior; address immediately and inform ED provider.  Assess family dynamic and appropriateness for visitation as needed: Yes.  ; If necessary, describe findings:  Educate the patient/family about BHU procedures/visitation: Yes.  ; If necessary, describe findings:

## 2018-01-19 NOTE — ED Notes (Signed)
pt becoming a little restless stating he wants to go home then saying he wants to go to the hospital. Explained to the patient that it is almost bed time and that he is at the hospital.

## 2018-01-19 NOTE — ED Notes (Signed)
BEHAVIORAL HEALTH ROUNDING Patient sleeping: No. Patient alert and oriented: yes Behavior appropriate: Yes.  ; If no, describe:  Nutrition and fluids offered: yes Toileting and hygiene offered: Yes  Sitter present: q15 minute observations and security  monitoring Law enforcement present: Yes  ODS  

## 2018-01-19 NOTE — ED Notes (Signed)
Am meds administered as ordered  Assessment completed  He denies pain  - we called his guardian - he talked for a few minutes  Update provided to her  Continue to monitor

## 2018-01-19 NOTE — ED Notes (Signed)
He is sitting on his bed stating  "Hey you"  Smiling  - no verbalized needs or concerns at this time   TV is on music at a calming volume   Reassurance provided  Continue to monitor

## 2018-01-19 NOTE — ED Notes (Signed)
BEHAVIORAL HEALTH ROUNDING Patient sleeping: No. Patient alert and oriented:  Alert  - oriented to self and place   Behavior appropriate: Yes.  ; If no, describe:  Nutrition and fluids offered: yes Toileting and hygiene offered: Yes  Sitter present: q15 minute observations and security monitoring Law enforcement present: Yes  ODS  ENVIRONMENTAL ASSESSMENT Potentially harmful objects out of patient reach: Yes.   Personal belongings secured: Yes.   Patient dressed in hospital provided attire only: Yes.   Plastic bags out of patient reach: Yes.   Patient care equipment (cords, cables, call bells, lines, and drains) shortened, removed, or accounted for: Yes.   Equipment and supplies removed from bottom of stretcher: Yes.   Potentially toxic materials out of patient reach: Yes.   Sharps container removed or out of patient reach: Yes.

## 2018-01-19 NOTE — ED Notes (Signed)
BEHAVIORAL HEALTH ROUNDING Patient sleeping: No. Patient alert and oriented:  Alert - oriented to self  Behavior appropriate: Yes.  ; If no, describe:  Nutrition and fluids offered: yes Toileting and hygiene offered: Yes  Sitter present: q15 minute observations and security monitoring Law enforcement present: Yes  ODS  

## 2018-01-20 LAB — CBC WITH DIFFERENTIAL/PLATELET
Basophils Absolute: 0 10*3/uL (ref 0–0.1)
Basophils Relative: 1 %
Eosinophils Absolute: 0 10*3/uL (ref 0–0.7)
Eosinophils Relative: 1 %
HEMATOCRIT: 38.3 % — AB (ref 40.0–52.0)
HEMOGLOBIN: 13.6 g/dL (ref 13.0–18.0)
LYMPHS ABS: 0.6 10*3/uL — AB (ref 1.0–3.6)
LYMPHS PCT: 11 %
MCH: 37.7 pg — AB (ref 26.0–34.0)
MCHC: 35.5 g/dL (ref 32.0–36.0)
MCV: 106 fL — ABNORMAL HIGH (ref 80.0–100.0)
MONO ABS: 0.4 10*3/uL (ref 0.2–1.0)
MONOS PCT: 8 %
NEUTROS ABS: 4.4 10*3/uL (ref 1.4–6.5)
NEUTROS PCT: 79 %
Platelets: 132 10*3/uL — ABNORMAL LOW (ref 150–440)
RBC: 3.61 MIL/uL — ABNORMAL LOW (ref 4.40–5.90)
RDW: 16.6 % — AB (ref 11.5–14.5)
WBC: 5.5 10*3/uL (ref 3.8–10.6)

## 2018-01-20 LAB — BASIC METABOLIC PANEL
ANION GAP: 4 — AB (ref 5–15)
BUN: 11 mg/dL (ref 6–20)
CALCIUM: 8.7 mg/dL — AB (ref 8.9–10.3)
CHLORIDE: 104 mmol/L (ref 98–111)
CO2: 30 mmol/L (ref 22–32)
Creatinine, Ser: 0.83 mg/dL (ref 0.61–1.24)
GFR calc non Af Amer: >60 mL/min (ref >60)
GLUCOSE: 98 mg/dL (ref 70–99)
POTASSIUM: 3.9 mmol/L (ref 3.5–5.1)
Sodium: 138 mmol/L (ref 135–145)

## 2018-01-20 NOTE — ED Notes (Signed)
Pt. Appears to be more tired than usually.  Pt. Had linens changed and clothes changed and pt. Needed assistance changing into clothes.

## 2018-01-20 NOTE — ED Notes (Signed)
Pt. Took medications and drank 8 oz cup of water.

## 2018-01-20 NOTE — ED Provider Notes (Signed)
-----------------------------------------   6:38 AM on 01/20/2018 -----------------------------------------   Blood pressure (!) 110/95, pulse (!) 108, temperature 98.1 F (36.7 C), temperature source Oral, resp. rate 17, height 5\' 1"  (1.549 m), weight 68 kg, SpO2 99 %.  The patient had no acute events since last update.  Calm and cooperative at this time.  Disposition is pending Psychiatry/Behavioral Medicine team recommendations.     Irean Hong, MD 01/20/18 [phone]

## 2018-01-20 NOTE — Progress Notes (Signed)
LCSW called [facility] Sl[facility] [employer] [phone] She reported she will call [facility] today to see if patient can transfer over to respite) This may only happen on Monday. The facility [facility] has accepted patient and bed is available on October 4th.   No further SW needs  Closer to patients  DC  Date  scripts will need to written Chest x ray to rule out TB Copy of Fl2/signed   Amiree No [place] LCSW [phone]

## 2018-01-20 NOTE — ED Notes (Signed)
Dr. Bevely Palmer aware, Dr. Helaine Chess orders.

## 2018-01-21 ENCOUNTER — Emergency Department: Payer: Medicare Other

## 2018-01-21 DIAGNOSIS — N170 Acute kidney failure with tubular necrosis: Secondary | ICD-10-CM | POA: Diagnosis not present

## 2018-01-21 DIAGNOSIS — A419 Sepsis, unspecified organism: Secondary | ICD-10-CM | POA: Diagnosis present

## 2018-01-21 DIAGNOSIS — J9600 Acute respiratory failure, unspecified whether with hypoxia or hypercapnia: Secondary | ICD-10-CM | POA: Diagnosis not present

## 2018-01-21 DIAGNOSIS — I248 Other forms of acute ischemic heart disease: Secondary | ICD-10-CM | POA: Diagnosis present

## 2018-01-21 DIAGNOSIS — Z79899 Other long term (current) drug therapy: Secondary | ICD-10-CM | POA: Diagnosis not present

## 2018-01-21 DIAGNOSIS — F72 Severe intellectual disabilities: Secondary | ICD-10-CM | POA: Diagnosis present

## 2018-01-21 DIAGNOSIS — G9340 Encephalopathy, unspecified: Secondary | ICD-10-CM | POA: Diagnosis not present

## 2018-01-21 DIAGNOSIS — F71 Moderate intellectual disabilities: Secondary | ICD-10-CM | POA: Diagnosis not present

## 2018-01-21 DIAGNOSIS — J9602 Acute respiratory failure with hypercapnia: Secondary | ICD-10-CM | POA: Diagnosis present

## 2018-01-21 DIAGNOSIS — R109 Unspecified abdominal pain: Secondary | ICD-10-CM | POA: Diagnosis not present

## 2018-01-21 DIAGNOSIS — R4182 Altered mental status, unspecified: Secondary | ICD-10-CM | POA: Diagnosis not present

## 2018-01-21 DIAGNOSIS — Z888 Allergy status to other drugs, medicaments and biological substances status: Secondary | ICD-10-CM | POA: Diagnosis not present

## 2018-01-21 DIAGNOSIS — R739 Hyperglycemia, unspecified: Secondary | ICD-10-CM | POA: Diagnosis present

## 2018-01-21 DIAGNOSIS — K5900 Constipation, unspecified: Secondary | ICD-10-CM | POA: Diagnosis not present

## 2018-01-21 DIAGNOSIS — H109 Unspecified conjunctivitis: Secondary | ICD-10-CM | POA: Diagnosis not present

## 2018-01-21 DIAGNOSIS — E872 Acidosis: Secondary | ICD-10-CM | POA: Diagnosis present

## 2018-01-21 DIAGNOSIS — R6521 Severe sepsis with septic shock: Secondary | ICD-10-CM | POA: Diagnosis present

## 2018-01-21 DIAGNOSIS — I639 Cerebral infarction, unspecified: Secondary | ICD-10-CM | POA: Diagnosis not present

## 2018-01-21 DIAGNOSIS — D696 Thrombocytopenia, unspecified: Secondary | ICD-10-CM | POA: Diagnosis present

## 2018-01-21 DIAGNOSIS — N472 Paraphimosis: Secondary | ICD-10-CM | POA: Diagnosis not present

## 2018-01-21 DIAGNOSIS — F840 Autistic disorder: Secondary | ICD-10-CM | POA: Diagnosis not present

## 2018-01-21 DIAGNOSIS — Q213 Tetralogy of Fallot: Secondary | ICD-10-CM | POA: Diagnosis not present

## 2018-01-21 DIAGNOSIS — R402433 Glasgow coma scale score 3-8, at hospital admission: Secondary | ICD-10-CM | POA: Diagnosis not present

## 2018-01-21 DIAGNOSIS — J690 Pneumonitis due to inhalation of food and vomit: Secondary | ICD-10-CM | POA: Diagnosis present

## 2018-01-21 DIAGNOSIS — Z23 Encounter for immunization: Secondary | ICD-10-CM | POA: Diagnosis not present

## 2018-01-21 DIAGNOSIS — R456 Violent behavior: Secondary | ICD-10-CM | POA: Diagnosis present

## 2018-01-21 DIAGNOSIS — J9601 Acute respiratory failure with hypoxia: Secondary | ICD-10-CM | POA: Diagnosis present

## 2018-01-21 DIAGNOSIS — Q909 Down syndrome, unspecified: Secondary | ICD-10-CM | POA: Diagnosis not present

## 2018-01-21 LAB — BLOOD GAS, ARTERIAL
ACID-BASE EXCESS: 1 mmol/L (ref 0.0–2.0)
Bicarbonate: 23.6 mmol/L (ref 20.0–28.0)
FIO2: 0.35
O2 Saturation: 98.5 %
PCO2 ART: 31 mmHg — AB (ref 32.0–48.0)
PEEP: 5 cmH2O
Patient temperature: 37
RATE: 16 resp/min
VT: 380 mL
pH, Arterial: 7.49 — ABNORMAL HIGH (ref 7.350–7.450)
pO2, Arterial: 107 mmHg (ref 83.0–108.0)

## 2018-01-21 LAB — URINALYSIS, COMPLETE (UACMP) WITH MICROSCOPIC
Bacteria, UA: NONE SEEN
Bilirubin Urine: NEGATIVE
GLUCOSE, UA: NEGATIVE mg/dL
Hgb urine dipstick: NEGATIVE
Ketones, ur: NEGATIVE mg/dL
Leukocytes, UA: NEGATIVE
Nitrite: NEGATIVE
PH: 6 (ref 5.0–8.0)
Protein, ur: NEGATIVE mg/dL
Specific Gravity, Urine: 1.021 (ref 1.005–1.030)
WBC, UA: NONE SEEN WBC/hpf (ref 0–5)

## 2018-01-21 LAB — COMPREHENSIVE METABOLIC PANEL
ALBUMIN: 3.4 g/dL — AB (ref 3.5–5.0)
ALK PHOS: 100 U/L (ref 38–126)
ALT: 38 U/L (ref 0–44)
AST: 66 U/L — AB (ref 15–41)
Anion gap: 10 (ref 5–15)
BILIRUBIN TOTAL: 0.5 mg/dL (ref 0.3–1.2)
BUN: 16 mg/dL (ref 6–20)
CO2: 28 mmol/L (ref 22–32)
CREATININE: 1.64 mg/dL — AB (ref 0.61–1.24)
Calcium: 8.8 mg/dL — ABNORMAL LOW (ref 8.9–10.3)
Chloride: 100 mmol/L (ref 98–111)
GFR calc Af Amer: 60 mL/min — ABNORMAL LOW (ref >60)
GFR, EST NON AFRICAN AMERICAN: 52 mL/min — AB (ref >60)
GLUCOSE: 155 mg/dL — AB (ref 70–99)
POTASSIUM: 4.7 mmol/L (ref 3.5–5.1)
Sodium: 138 mmol/L (ref 135–145)
TOTAL PROTEIN: 6.8 g/dL (ref 6.5–8.1)

## 2018-01-21 LAB — CBC
HEMATOCRIT: 36.6 % — AB (ref 40.0–52.0)
HEMATOCRIT: 37.5 % — AB (ref 40.0–52.0)
HEMOGLOBIN: 13.2 g/dL (ref 13.0–18.0)
HEMOGLOBIN: 13.2 g/dL (ref 13.0–18.0)
MCH: 39 pg — AB (ref 26.0–34.0)
MCH: 38 pg — AB (ref 26.0–34.0)
MCHC: 35.9 g/dL (ref 32.0–36.0)
MCHC: 35.2 g/dL (ref 32.0–36.0)
MCV: 108.6 fL — ABNORMAL HIGH (ref 80.0–100.0)
MCV: 107.8 fL — ABNORMAL HIGH (ref 80.0–100.0)
Platelets: 146 10*3/uL — ABNORMAL LOW (ref 150–440)
Platelets: 150 10*3/uL (ref 150–440)
RBC: 3.37 MIL/uL — ABNORMAL LOW (ref 4.40–5.90)
RBC: 3.48 MIL/uL — ABNORMAL LOW (ref 4.40–5.90)
RDW: 16.7 % — ABNORMAL HIGH (ref 11.5–14.5)
RDW: 16.2 % — AB (ref 11.5–14.5)
WBC: 9.1 10*3/uL (ref 3.8–10.6)
WBC: 8.8 10*3/uL (ref 3.8–10.6)

## 2018-01-21 LAB — LACTIC ACID, PLASMA
LACTIC ACID, VENOUS: 3 mmol/L — AB (ref 0.5–1.9)
LACTIC ACID, VENOUS: 3.7 mmol/L — AB (ref 0.5–1.9)
Lactic Acid, Venous: 2.4 mmol/L (ref 0.5–1.9)

## 2018-01-21 LAB — PROCALCITONIN: PROCALCITONIN: 0.58 ng/mL

## 2018-01-21 LAB — URINE DRUG SCREEN, QUALITATIVE (ARMC ONLY)
AMPHETAMINES, UR SCREEN: NOT DETECTED
BENZODIAZEPINE, UR SCRN: POSITIVE — AB
Barbiturates, Ur Screen: NOT DETECTED
CANNABINOID 50 NG, UR ~~LOC~~: NOT DETECTED
Cocaine Metabolite,Ur ~~LOC~~: NOT DETECTED
MDMA (Ecstasy)Ur Screen: NOT DETECTED
Methadone Scn, Ur: NOT DETECTED
Opiate, Ur Screen: NOT DETECTED
PHENCYCLIDINE (PCP) UR S: NOT DETECTED
Tricyclic, Ur Screen: NOT DETECTED

## 2018-01-21 LAB — FIBRIN DERIVATIVES D-DIMER (ARMC ONLY): Fibrin derivatives D-dimer (ARMC): 497.06 ng/mL (FEU) (ref 0.00–499.00)

## 2018-01-21 LAB — MRSA PCR SCREENING: MRSA by PCR: NEGATIVE

## 2018-01-21 LAB — CREATININE, SERUM
Creatinine, Ser: 1.38 mg/dL — ABNORMAL HIGH (ref 0.61–1.24)
GFR calc Af Amer: >60 mL/min (ref >60)
GFR calc non Af Amer: >60 mL/min (ref >60)

## 2018-01-21 LAB — TYPE AND SCREEN
ABO/RH(D): O NEG
ANTIBODY SCREEN: NEGATIVE

## 2018-01-21 LAB — TROPONIN I
TROPONIN I: 0.04 ng/mL — AB (ref <0.03)
Troponin I: 0.4 ng/mL (ref <0.03)

## 2018-01-21 LAB — GLUCOSE, CAPILLARY: Glucose-Capillary: 162 mg/dL — ABNORMAL HIGH (ref 70–99)

## 2018-01-21 LAB — BRAIN NATRIURETIC PEPTIDE: B Natriuretic Peptide: 119 pg/mL — ABNORMAL HIGH (ref 0.0–100.0)

## 2018-01-21 MED ORDER — SODIUM CHLORIDE 0.9 % IV SOLN
1000.0000 mL | Freq: Once | INTRAVENOUS | Status: AC
  Administered 2018-01-21: 1000 mL via INTRAVENOUS

## 2018-01-21 MED ORDER — FENTANYL CITRATE (PF) 100 MCG/2ML IJ SOLN
100.0000 ug | INTRAMUSCULAR | Status: DC | PRN
  Administered 2018-01-22: 100 ug via INTRAVENOUS
  Filled 2018-01-21 (×2): qty 2

## 2018-01-21 MED ORDER — PROPOFOL 1000 MG/100ML IV EMUL
5.0000 ug/kg/min | INTRAVENOUS | Status: AC
  Administered 2018-01-21: 5 ug/kg/min via INTRAVENOUS
  Filled 2018-01-21: qty 100

## 2018-01-21 MED ORDER — MIDAZOLAM HCL 5 MG/5ML IJ SOLN: 2.0000 mg | INTRAMUSCULAR | Status: DC | PRN

## 2018-01-21 MED ORDER — SODIUM CHLORIDE 0.9 % IV SOLN
2.0000 g | Freq: Two times a day (BID) | INTRAVENOUS | Status: DC
  Administered 2018-01-22: 2 g via INTRAVENOUS
  Filled 2018-01-21 (×2): qty 2

## 2018-01-21 MED ORDER — NOREPINEPHRINE 4 MG/250ML-% IV SOLN
INTRAVENOUS | Status: AC
  Filled 2018-01-21: qty 250

## 2018-01-21 MED ORDER — VANCOMYCIN HCL IN DEXTROSE 1-5 GM/200ML-% IV SOLN
1000.0000 mg | Freq: Once | INTRAVENOUS | Status: AC
  Administered 2018-01-21: 1000 mg via INTRAVENOUS
  Filled 2018-01-21: qty 200

## 2018-01-21 MED ORDER — ETOMIDATE 2 MG/ML IV SOLN
20.0000 mg | Freq: Once | INTRAVENOUS | Status: AC
  Administered 2018-01-21: 20 mg via INTRAVENOUS

## 2018-01-21 MED ORDER — SODIUM CHLORIDE 0.9 % IV SOLN
Freq: Once | INTRAVENOUS | Status: AC
  Administered 2018-01-21: 17:00:00 via INTRAVENOUS

## 2018-01-21 MED ORDER — MIDAZOLAM HCL 5 MG/5ML IJ SOLN
2.0000 mg | Freq: Once | INTRAMUSCULAR | Status: AC
  Administered 2018-01-21: 2 mg via INTRAVENOUS

## 2018-01-21 MED ORDER — SODIUM CHLORIDE 0.9 % IV BOLUS
1000.0000 mL | Freq: Once | INTRAVENOUS | Status: AC
  Administered 2018-01-21: 1000 mL via INTRAVENOUS

## 2018-01-21 MED ORDER — NOREPINEPHRINE 4 MG/250ML-% IV SOLN
0.0000 ug/min | Freq: Once | INTRAVENOUS | Status: AC
  Administered 2018-01-21: 10 ug/min via INTRAVENOUS

## 2018-01-21 MED ORDER — SODIUM CHLORIDE 0.9 % IV SOLN
2.0000 g | Freq: Once | INTRAVENOUS | Status: AC
  Administered 2018-01-21: 2 g via INTRAVENOUS
  Filled 2018-01-21: qty 2

## 2018-01-21 MED ORDER — FENTANYL CITRATE (PF) 100 MCG/2ML IJ SOLN
100.0000 ug | INTRAMUSCULAR | Status: DC | PRN
  Administered 2018-01-21: 100 ug via INTRAVENOUS

## 2018-01-21 MED ORDER — VECURONIUM BROMIDE 10 MG IV SOLR
10.0000 mg | Freq: Once | INTRAVENOUS | Status: AC
  Administered 2018-01-21: 10 mg via INTRAVENOUS

## 2018-01-21 MED ORDER — VANCOMYCIN HCL IN DEXTROSE 750-5 MG/150ML-% IV SOLN
750.0000 mg | INTRAVENOUS | Status: DC
  Administered 2018-01-22: 750 mg via INTRAVENOUS
  Filled 2018-01-21 (×2): qty 150

## 2018-01-21 MED ORDER — ENOXAPARIN SODIUM 40 MG/0.4ML ~~LOC~~ SOLN
40.0000 mg | SUBCUTANEOUS | Status: DC
  Administered 2018-01-21 – 2018-01-22 (×2): 40 mg via SUBCUTANEOUS
  Filled 2018-01-21 (×2): qty 0.4

## 2018-01-21 MED ORDER — INSULIN ASPART 100 UNIT/ML ~~LOC~~ SOLN: 0.0000 [IU] | SUBCUTANEOUS | Status: DC

## 2018-01-21 MED ORDER — SODIUM CHLORIDE 0.9 % IV SOLN
2.0000 mg/h | INTRAVENOUS | Status: DC
  Administered 2018-01-21: 2 mg/h via INTRAVENOUS
  Filled 2018-01-21: qty 10

## 2018-01-21 MED ORDER — DOCUSATE SODIUM 50 MG/5ML PO LIQD
100.0000 mg | Freq: Two times a day (BID) | ORAL | Status: DC | PRN
  Filled 2018-01-21: qty 10

## 2018-01-21 MED ORDER — IPRATROPIUM-ALBUTEROL 0.5-2.5 (3) MG/3ML IN SOLN
3.0000 mL | Freq: Four times a day (QID) | RESPIRATORY_TRACT | Status: DC
  Administered 2018-01-21 – 2018-01-22 (×3): 3 mL via RESPIRATORY_TRACT
  Filled 2018-01-21 (×3): qty 3

## 2018-01-21 MED ORDER — SODIUM CHLORIDE 0.9 % IV SOLN
INTRAVENOUS | Status: DC
  Administered 2018-01-21: 23:00:00 via INTRAVENOUS

## 2018-01-21 MED ORDER — FAMOTIDINE IN NACL 20-0.9 MG/50ML-% IV SOLN
20.0000 mg | Freq: Two times a day (BID) | INTRAVENOUS | Status: DC
  Administered 2018-01-21 – 2018-01-22 (×2): 20 mg via INTRAVENOUS
  Filled 2018-01-21 (×2): qty 50

## 2018-01-21 NOTE — ED Provider Notes (Signed)
Urinalysis returned, no evidence of urinary tract infection.     Governor Rooks, MD 01/21/18 [phone]

## 2018-01-21 NOTE — Progress Notes (Signed)
Pharmacy Antibiotic Note  Derrick Marshall is a 37 y.o. male admitted on 12/17/2017 with pneumonia.  Pharmacy has been consulted for Vancomycin, Cefepime  dosing.  Plan:  CrCl = 50.6 ml/hr Ke = 0.046 hr-1 T1/2 = 15 hrs Vd = 41 L    Cefepime 2 gm IV X 1 given on 9/28 @ 19:00. Cefepime 2 gm IV Q12H ordered to continue on 9/29 @ 0700.  Vancomycin 1 gm IV X 1 given on 9/28 @ 19:00. Vancomycin 750 mg IV Q18H ordered to start on 9/29 @ 0300, ~ 8 hrs after 1st dose (stacked dosing). This pt will reach Css by 10/1 @ 19:00.  Will draw 1st trough on 10/2 @ 0230, which will be at Css.   Height: 5\' 1"  (154.9 cm) Weight: 149 lb 14.6 oz (68 kg) IBW/kg (Calculated) : 52.3  Temp (24hrs), Avg:98.7 F (37.1 C), Min:95.1 F (35.1 C), Max:99.5 F (37.5 C)  Recent Labs  Lab 01/18/18 1734 01/20/18 2140 01/21/18 1645 01/21/18 1646 01/21/18 1832  WBC 4.8 5.5 9.1  --   --   CREATININE 0.91 0.83 1.64*  --   --   LATICACIDVEN  --   --   --  3.0* 2.4*    Estimated Creatinine Clearance: 50.6 mL/min (A) (by C-G formula based on SCr of 1.64 mg/dL (H)).    Allergies  Allergen Reactions  . Celexa [Citalopram Hydrobromide] Other (See Comments)    Aggressive   . Zyprexa [Olanzapine] Other (See Comments)    Aggressive behavior     Antimicrobials this admission:   >>    >>   Dose adjustments this admission:   Microbiology results:  BCx:   UCx:    Sputum:   MRSA PCR:   Thank you for allowing pharmacy to be a part of this patient's care.  Cheyane Ayon D 01/21/2018 9:15 PM

## 2018-01-21 NOTE — Progress Notes (Signed)
LCSW spoke with Baxter Hire PT and patient will be seen tomorrow.  [employer] LCSW [phone]

## 2018-01-21 NOTE — Consult Note (Addendum)
PULMONARY / CRITICAL CARE MEDICINE   Name: Derrick Marshall MRN: [id number] DOB: 01/18/1981    ADMISSION DATE:  12/17/2017 CONSULTATION DATE:  01/21/18  REFERRING MD:  Dr. Nancy Marus  CHIEF COMPLAINT:  Unresponsive  BRIEF SUMMARY: 37 y.o. Male who has been in the ED Behavorial unit at [facility] awaiting placement for approximately a month due to aggressive behavior.  On 9/28 he became unresponsive requiring intubation for airway protection.  CXR with concern for HCAP vs Pulmonary edema.  HISTORY OF PRESENT ILLNESS:   Derrick Marshall is a 37 y.o. Male with a PMH of Autism and Down Syndrome, dementia who has been in the ED Behavioral unit at [facility] since 8/24 awaiting placement due to aggressive behavior.  On 9/28, he was noted to be unresponsive, tachycardic, and hypotensive.  He was emergently intubated due to inability to protect his airway.  The pt's sister reports that he has had a mild cough and hoarseness for approximately 1 week, with recent complaints of abdominal pain.  CXR concerning for questionable pneumonia vs pulmonary edema. Initial workup reveals Lactic acid 3.0, WBC 9.1, Troponin 0.04, Platelets 146, and FDP's 497. Pt is admitted to [facility] ICU for unresponsiveness requiring intubation for airway protection, and questionable HCAP.  PCCM is consulted for further management.   PAST MEDICAL HISTORY :  He  has a past medical history of Autism and Down syndrome.  PAST SURGICAL HISTORY: He  has no past surgical history on file.  Allergies  Allergen Reactions  . Celexa [Citalopram Hydrobromide] Other (See Comments)    Aggressive   . Zyprexa [Olanzapine] Other (See Comments)    Aggressive behavior     No current facility-administered medications on file prior to encounter.    Current Outpatient Medications on File Prior to Encounter  Medication Sig  . benztropine (COGENTIN) 1 MG tablet Take 1 mg by mouth 2 (two) times daily.  . bisacodyl (DULCOLAX) 10 MG suppository Place 10 mg rectally as  needed for moderate constipation. Insert 1 suppository per rectum if no bowel movements in 48 hours  . chlorhexidine (HIBICLENS) 4 % external liquid Apply 1 application topically once a week. Shower from head to toe weekly  . diazepam (VALIUM) 5 MG tablet Take 1 tablet (5 mg total) by mouth daily at 2 PM.  . docusate sodium (COLACE) 100 MG capsule Take 100 mg by mouth 2 (two) times daily.  . famotidine (PEPCID) 20 MG tablet Take 20 mg by mouth 2 (two) times daily before a meal.  . haloperidol (HALDOL) 1 MG tablet Take 3 mg by mouth 2 (two) times daily. In the morning and at 1630  . miconazole (MICOTIN) 2 % powder Apply topically daily. Apply powder to groin, abdomen and buttocks daily after shower  . Miconazole Nitrate (ALOE VESTA ANTIFUNGAL) 2 % OINT Apply 1 application topically 2 (two) times daily as needed (itching). Apply to groin area  . Multiple Vitamin (MULTIVITAMIN WITH MINERALS) TABS tablet Take 1 tablet by mouth daily.  . Skin Protectants, Misc. (EUCERIN) cream Apply 1 application topically daily.   . vitamin B-12 (CYANOCOBALAMIN) 500 MCG tablet Take 1,000 mcg by mouth 2 (two) times daily.  . Vitamin D, Ergocalciferol, (DRISDOL) 50000 units CAPS capsule Take 50,000 Units by mouth every 7 (seven) days.  Marland Kitchen ammonium lactate (LAC-HYDRIN) 12 % lotion Apply 1 application topically daily. Apply lotion to legs and chest daily after shower    FAMILY HISTORY:  His has no family status information on file.    SOCIAL HISTORY:  He  reports that he has never smoked. He has never used smokeless tobacco. He reports that he does not drink alcohol or use drugs.  REVIEW OF SYSTEMS:   Unable to obtain due to intubation  SUBJECTIVE:  Unable to obtain due to intubation  VITAL SIGNS: BP 103/63   Pulse (!) 101   Temp 99.4 F (37.4 C)   Resp (!) 26   Ht 5\' 1"  (1.549 m)   Wt 68 kg   SpO2 100%   BMI 28.33 kg/m   HEMODYNAMICS:    VENTILATOR SETTINGS: Vent Mode: AC FiO2 (%):  [24 %-35 %] 24  % Set Rate:  [16 bmp] 16 bmp Vt Set:  [380 mL] 380 mL PEEP:  [5 cmH20] 5 cmH20  INTAKE / OUTPUT: I/O last 3 completed shifts: In: 1000 [I.V.:1000] Out: -   PHYSICAL EXAMINATION: General:  Acutely ill appearing male, laying in bed, intubated, in NAD Neuro:  Sedated, withdraws from pain, Pupils PERRL 2 mm sluggish bilaterally HEENT:  Atraumatic, normocephalic, no JVD, neck supple Cardiovascular:  Tachycardia, regular rhythm, 2/6 murmur noted, 2+ pulses noted throughout Lungs:  Coarse breath sounds auscultated bilaterally, even, nonlabored, vent assisted Abdomen:  Soft, nontender, nondistended, BS+ x4 Musculoskeletal:  Normal bulk and tone, no deformities Skin:  Warm/dry. No obvious rashes, lesions, or ulcerations  LABS:  BMET Recent Labs  Lab 01/18/18 1734 01/20/18 2140 01/21/18 1645  NA 137 138 138  K 4.0 3.9 4.7  CL 100 104 100  CO2 27 30 28   BUN 12 11 16   CREATININE 0.91 0.83 1.64*  GLUCOSE 114* 98 155*    Electrolytes Recent Labs  Lab 01/18/18 1734 01/20/18 2140 01/21/18 1645  CALCIUM 9.2 8.7* 8.8*    CBC Recent Labs  Lab 01/18/18 1734 01/20/18 2140 01/21/18 1645  WBC 4.8 5.5 9.1  HGB 14.7 13.6 13.2  HCT 42.9 38.3* 36.6*  PLT 149* 132* 146*    Coag's No results for input(s): APTT, INR in the last 168 hours.  Sepsis Markers Recent Labs  Lab 01/21/18 1646 01/21/18 1832  LATICACIDVEN 3.0* 2.4*    ABG Recent Labs  Lab 01/21/18 1822  PHART 7.49*  PCO2ART 31*  PO2ART 107    Liver Enzymes Recent Labs  Lab 01/21/18 1645  AST 66*  ALT 38  ALKPHOS 100  BILITOT 0.5  ALBUMIN 3.4*    Cardiac Enzymes Recent Labs  Lab 01/21/18 1645  TROPONINI 0.04*    Glucose Recent Labs  Lab 01/18/18 1716  GLUCAP 91    Imaging Ct Head Wo Contrast  Result Date: 01/21/2018 CLINICAL DATA:  Patient found unresponsive. EXAM: CT HEAD WITHOUT CONTRAST TECHNIQUE: Contiguous axial images were obtained from the base of the skull through the vertex  without intravenous contrast. COMPARISON:  None. FINDINGS: Brain: No evidence of acute infarction, hemorrhage, hydrocephalus, extra-axial collection or mass lesion/mass effect. Vascular: No hyperdense vessel or unexpected calcification. Skull: Normal. Negative for fracture or focal lesion. Sinuses/Orbits: No acute finding. Other: None. IMPRESSION: No cause for the patient's symptoms identified. Electronically Signed   By: Gerome Sam III M.D   On: 01/21/2018 18:34   Dg Chest Portable 1 View  Result Date: 01/21/2018 CLINICAL DATA:  Evaluate ET and OG tube. EXAM: PORTABLE CHEST 1 VIEW COMPARISON:  January 21, 2018 FINDINGS: The ETT terminates in the mid trachea. An OG tube is not visualized. No pneumothorax. No nodules or masses. Mild atelectasis in the left base. No other acute abnormalities noted. IMPRESSION: 1. No OG tube identified.  Recommend clinical correlation. 2. The ETT terminates in good position. 3. No other acute abnormalities. Electronically Signed   By: Gerome Sam III M.D   On: 01/21/2018 17:53   Dg Chest Port 1 View  Result Date: 01/21/2018 CLINICAL DATA:  Altered mental status EXAM: PORTABLE CHEST 1 VIEW COMPARISON:  12/21/2017 chest radiograph. FINDINGS: Intact sternotomy wires. Stable cardiomediastinal silhouette with top-normal heart size. No pneumothorax. No pleural effusion. Low lung volumes. There is diffuse prominence of the parahilar interstitial markings. IMPRESSION: Low lung volumes. Diffuse prominence of the parahilar interstitial markings, which could represent mild pulmonary edema or pneumonia. Recommend follow-up PA and lateral chest radiographs. Electronically Signed   By: Delbert Phenix M.D.   On: 01/21/2018 17:39   Dg Abd Portable 1 View  Result Date: 01/21/2018 CLINICAL DATA:  OG tube placement EXAM: PORTABLE ABDOMEN - 1 VIEW COMPARISON:  12/21/2017 FINDINGS: Enteric tube terminates in the proximal gastric body. Nonobstructive bowel gas pattern. Visualized  osseous structures are within normal limits. IMPRESSION: Enteric tube terminates in the proximal gastric body. Electronically Signed   By: Charline Bills M.D.   On: 01/21/2018 19:06     STUDIES:  CT Head 9/28>> Negative Venous US of Bilateral LE 9/28>> Echocardiogram 9/29>>  CULTURES: Blood x2 01/21/18>> Tracheal aspirate 01/21/18>> Strep Pneumo urinary antigen 01/21/18>> Legionella urinary antigen 01/21/18>>  ANTIBIOTICS: Vancomycin 9/28>> Cefepime 9/28>>  SIGNIFICANT EVENTS: 12/17/17>> Placed in Behavioral unit at [facility] ED, awaiting placement 01/21/18>> Unresponsive, intubated for airway protection, admitted to ICU  LINES/TUBES: ETT 9/28>>   ASSESSMENT / PLAN:  PULMONARY A: Intubated for airway protection in setting of Unresponsiveness ? HCAP -CXR concerning for ? HCAP vs Pulmonary edema -BNP 119 P:   -Full vent support, PRVC: 8 cc/kg -Wean FiO2 and Peep  as tolerated -Follow intermittent CXR and ABG -VAP Protocol -Pulmonary hygiene -Follow cultures -Abx as above -Obtain BNP>119  CARDIOVASCULAR A:  Hypotension Meets SIRS Criteria Septic Shock +/- Sedation post intubation 2/6 Murmur Borderline FDP's> 497, could pt have ? PE P:  -Cardiac monitoring -Maintain MAP >65 -Received 1L NS bolus in ED, will give additional 1L NS bolus -Levophed as needed to maintain MAP goal -Gentle IVF: NS @ 75 ml/hr -Trend Troponin -Obtain Echocardiogram -Obtain Ultrasound of bilateral LE given his borderline FDP's of 496, approximately 1 month of being in hospital, and abrupt onset of unresponsiveness.  Will avoid CTA of Chest for now given pt's AKI   RENAL A:   AKI Lactic acidosis P:   -Monitor I&O's / urinary output -Follow BMP -Ensure adequate renal perfusion -Avoid nephrotoxic agents as able -Replace electrolytes as indicated -Trend Lactic acid -IVF: NS @ 75 ml/hr  GASTROINTESTINAL A:   No acute issues P:   -Keep NPO -OG tube -Pepcid for  SUP  HEMATOLOGIC A:   Mild Thrombocytopenia P:  -Monitor for s/sx of bleeding -Trend CBC -Lovenox for VTE prophylaxis -Transfuse for Hgb<7 -Transfuse Platelets for Platelet count <50 and active bleeding  INFECTIOUS A:   ? HCAP Sepsis P:   -Monitor fever curve -Trend WBC's -Trend PCT -Follow cultures as above -Continue Vancomycin and Cefepime  ENDOCRINE A:   Hyperglycemia   P:   -CBG's -SSI -Follow ICU Hypo/hyperglycemia protocol  NEUROLOGIC A:   Altered mental status, likely in setting of Sepsis and HCAP -CT head negative 9/28 Sedation needs in setting of mechanical ventilation Hx: Autism, Down syndrome P:   -RASS goal: 0 to -1 -Propofol gtt to maintain RASS goal -Prn Fentanyl and versed pushes -Daily  WUA -Avoid sedating meds as able -Provide supportive care -Psych following, appreciate input  FAMILY  - Updates: Updated pt's family at bedside 01/21/18.  Pt is a Full Code.  - Inter-disciplinary family meet or Palliative Care meeting due by:  01/28/18    Harlon Ditty, AGACNP-BC [facility] Pager: [phone]   01/21/2018, 8:52 PM

## 2018-01-21 NOTE — ED Notes (Signed)
Returned from CT.

## 2018-01-21 NOTE — ED Provider Notes (Signed)
-----------------------------------------   7:08 AM on 01/21/2018 -----------------------------------------   Blood pressure 96/70, pulse (!) 105, temperature 98.2 F (36.8 C), temperature source Oral, resp. rate 17, height 5\' 1"  (1.549 m), weight 68 kg, SpO2 99 %.  Patient had labs redrawn yesterday. Nursing tells me that his mobility has decreased. Haldol was added several days ago by psychiatry; unclear whether or not this is accounting for part of patient's decreased mobility.  Will consult physical therapy to work with the patient.  Disposition is pending Psychiatry/Behavioral Medicine team recommendations.     Irean Hong, MD 01/21/18 [phone]

## 2018-01-21 NOTE — ED Notes (Signed)
Nurse and tech went in to try and wake pt and the pt would not respond. Sternal rub was done on pt with no response. The pts vitals were assessed and pts bp noted to be low with a high heart rate and low o2 sats. Doctor Cyril Loosen advised and responded to bed side to assess pt.

## 2018-01-21 NOTE — ED Notes (Signed)
Dr. Cyril Loosen notified of Lactic Acid of 3.0 and Troponin of 0.04

## 2018-01-21 NOTE — Progress Notes (Signed)
PT Cancellation Note  Patient Details Name: Derrick Marshall MRN: [id number] DOB: [birth year]   Cancelled Treatment:    Reason Eval/Treat Not Completed: Other (comment)(Consult received and chart reviewed.  Per discusion with CSW, patient not able to leave facility until Monday at the earliest.  Per high-census policy, will triage patient until next date and initiate PT evaluation 9/29.  Please call should discharge plans change or patient need imminent PT evaluation.)   Kais Monje H. Manson Passey, PT, DPT, NCS 01/21/18, 10:52 AM [phone]

## 2018-01-21 NOTE — ED Notes (Signed)
Pts food tray taken to him and pts name was called to try and wake pt and pt would not wake to eat. Nurse advised

## 2018-01-21 NOTE — ED Notes (Signed)
1st set of blood cultures drawn at 1825, second set at 1832

## 2018-01-21 NOTE — ED Notes (Addendum)
Assumed care for patient roughly around 1645 from [facility] in the behavioral quad.  Pt was found to be less responsive than usual, hypoxic, tachycardic and hypotensive.  Staff responded to pt in room 21 and pt was immediately placed on NRB, IVs placed and EDP at bedside.  Pt transferred to room 1 where he was intubated due to difficulty maintaining an airway. At this time pt is on vent, sister sitting at bedside.  Dr. Cyril Loosen spoke with sister about her concerns.

## 2018-01-21 NOTE — ED Notes (Signed)
Pt sleeping and snoring.

## 2018-01-21 NOTE — ED Provider Notes (Addendum)
Patient, not really getting up, but no focal weakness.  He is mumbling a bit.  Suspect most likely secondary to recent haldol medication, however, given acute change, will CT head to ensure no intracrainial cause.   Addended -- after discussion with tech who has been working with him the past few days and staff who know him well, his speech is pretty consistent with his baseline with Down Syndrome.  The major change is that he is lying in urine in bed and not up and about walking or asking for hugs.  On exam, he has no focal findings, and seems more sedative effect and so I am going to hold off on head CT as I think much more likely side effect of the haldol.   Derrick Rooks, MD 01/21/18 4098    Derrick Rooks, MD 01/21/18 [phone]

## 2018-01-21 NOTE — Progress Notes (Signed)
CRITICAL VALUE ALERT  Critical Value:  Troponin  and lactic acid   Date & Time Notied:  2245  Provider Notified: Seward Grater, NP  Orders Received/Actions taken: See new orders entered

## 2018-01-21 NOTE — ED Provider Notes (Addendum)
Notified by nurse at approximately 4 PM that the patient is difficult to arouse, asked for updated set of vitals, notified of hypotension and hypoxia and tachycardia.  We immediately started IV fluids, applied nonrebreather placed the patient on the cardiac monitor and obtain an EKG.  EKG shows sinus tachycardia.  Patient with GCS of 3, minimal gag.  Decision made to intubate, started levophed for hypotension.  Able to intubate without paralytics.  Versed ordered given hypotension.   Blood pressure improving with levophed   Differential is long: Possible aspiration, perhaps unwitnessed fall, oversedation from daily medications, history of Down syndrome so perhaps cardiomyopathy.  Pending labs, chest x-ray, CT head   ED ECG REPORT I, Jene Every, the attending physician, personally viewed and interpreted this ECG.  Date: 01/21/2018  Rhythm: Sinus tachycardia QRS Axis: normal Intervals: normal ST/T Wave abnormalities: normal Narrative Interpretation: no evidence of acute ischemia   Procedure Name: Intubation Date/Time: 01/21/2018 5:18 PM Performed by: Jene Every, MD Pre-anesthesia Checklist: Patient identified, Emergency Drugs available, Suction available and Patient being monitored Oxygen Delivery Method: Non-rebreather mask Preoxygenation: Pre-oxygenation with 100% oxygen Laryngoscope Size: Glidescope and 3 Grade View: Grade I Tube size: 7.5 mm Number of attempts: 1 Placement Confirmation: ETT inserted through vocal cords under direct vision,  CO2 detector and Breath sounds checked- equal and bilateral Dental Injury: Teeth and Oropharynx as per pre-operative assessment     .Critical Care Performed by: Jene Every, MD Authorized by: Jene Every, MD   Critical care provider statement:    Critical care time (minutes):  35   Critical care time was exclusive of:  Separately billable procedures and treating other patients   Critical care was necessary to treat or  prevent imminent or life-threatening deterioration of the following conditions:  Circulatory failure, cardiac failure, respiratory failure, sepsis, shock and toxidrome   Critical care was time spent personally by me on the following activities:  Development of treatment plan with patient or surrogate, discussions with consultants, evaluation of patient's response to treatment, examination of patient, obtaining history from patient or surrogate, ordering and performing treatments and interventions, ordering and review of laboratory studies, ordering and review of radiographic studies, pulse oximetry, re-evaluation of patient's condition and review of old charts   I assumed direction of critical care for this patient from another provider in my specialty: no      Angiocath insertion Performed by: Jene Every  Consent: Verbal consent obtained. Risks and benefits: risks, benefits and alternatives were discussed Time out: Immediately prior to procedure a "time out" was called to verify the correct patient, procedure, equipment, support staff and site/side marked as required.  Preparation: Patient was prepped and draped in the usual sterile fashion.  Vein Location:right AC  Ultrasound Guided  Gauge: 18  Normal blood return and flush without difficulty Patient tolerance: Patient tolerated the procedure well with no immediate complications.       Jene Every, MD 01/21/18 Sallyanne Havers    Jene Every, MD 01/22/18 [phone]

## 2018-01-21 NOTE — ED Notes (Addendum)
Went to give pt his meds and pt was hard to arouse. Sternal rub done and pt did not respond. VS T98.5, BP 64/37, HR 118, O2 68% RA. MD notified and new orders received for labs, x-ray, oxygen and IV fluids. Pt transferred to room ED Rm1

## 2018-01-21 NOTE — ED Notes (Addendum)
Pt assisted with showering by this tech and Lorin Picket, EDT. Pt unable to bathe self (previously able to bathe self with guidance). After shower deodorant was put on pt and lotion was applied to pts hands, arms, feet and legs. While pt was showering, pts room was mopped, bed was bleached, pillow and clean pillow case were replaced, and clean sheets and blankets were put on the bed.  Pt is not able to ambulate at this time and has been urinating on self in the bed. Pt changed into clean scrubs, brief and socks after shower.

## 2018-01-21 NOTE — ED Notes (Addendum)
Spoke with Dr. Cyril Loosen, pt is under IVC at this time, pt's sister is able to visit and pt is not an immediate harm to himself at this time.  Informed Dr. Cyril Loosen of lactic acid trending down.

## 2018-01-21 NOTE — H&P (Addendum)
[facility] - [place] at [facility]   PATIENT NAME: Derrick Marshall    MR#:  [id number]  DATE OF BIRTH:  1981-01-19  DATE OF ADMISSION:  12/17/2017  PRIMARY CARE PHYSICIAN: Margaretann Loveless, MD   REQUESTING/REFERRING PHYSICIAN: Jene Every, MD  CHIEF COMPLAINT:   Chief Complaint  Patient presents with  . Aggressive Behavior    HISTORY OF PRESENT ILLNESS:  Derrick Marshall  is a 37 y.o. male with a known history of down syndrome, dementia, tetrology of fallot who has spent the last month in the ED awaiting placement. Today, he was noted to be unresponsive, tachycardic, and hypotensive. Patient was intubated due to inability to protect his airway. Per his sister, he had a mild cough and hoarseness a week ago. He has recently been complaining of abdominal pain.  CXR showed possible pneumonia vs pulmonary edema. Lactic acid was elevated to 3.0. WBC increased from 5.5 to 9.1. Hospitalists were called for admission.  PAST MEDICAL HISTORY:   Past Medical History:  Diagnosis Date  . Autism   . Down syndrome     PAST SURGICAL HISTORY:  History reviewed. No pertinent surgical history.  SOCIAL HISTORY:   Social History   Tobacco Use  . Smoking status: Never Smoker  . Smokeless tobacco: Never Used  Substance Use Topics  . Alcohol use: Never    Frequency: Never    FAMILY HISTORY:  No family history on file.  DRUG ALLERGIES:   Allergies  Allergen Reactions  . Celexa [Citalopram Hydrobromide] Other (See Comments)    Aggressive   . Zyprexa [Olanzapine] Other (See Comments)    Aggressive behavior     REVIEW OF SYSTEMS:   ROS- unable to obtain- patient intubated and sedated  MEDICATIONS AT HOME:   Prior to Admission medications   Medication Sig Start Date End Date Taking? Authorizing Provider  benztropine (COGENTIN) 1 MG tablet Take 1 mg by mouth 2 (two) times daily.   Yes [provider]  bisacodyl (DULCOLAX) 10 MG suppository Place 10  mg rectally as needed for moderate constipation. Insert 1 suppository per rectum if no bowel movements in 48 hours   Yes [provider]  chlorhexidine (HIBICLENS) 4 % external liquid Apply 1 application topically once a week. Shower from head to toe weekly   Yes [provider]  diazepam (VALIUM) 5 MG tablet Take 1 tablet (5 mg total) by mouth daily at 2 PM. 12/12/17  Yes Clapacs, Jackquline Denmark, MD  docusate sodium (COLACE) 100 MG capsule Take 100 mg by mouth 2 (two) times daily.   Yes [provider]  famotidine (PEPCID) 20 MG tablet Take 20 mg by mouth 2 (two) times daily before a meal.   Yes [provider]  haloperidol (HALDOL) 1 MG tablet Take 3 mg by mouth 2 (two) times daily. In the morning and at 1630   Yes [provider]  miconazole (MICOTIN) 2 % powder Apply topically daily. Apply powder to groin, abdomen and buttocks daily after shower   Yes [provider]  Miconazole Nitrate (ALOE VESTA ANTIFUNGAL) 2 % OINT Apply 1 application topically 2 (two) times daily as needed (itching). Apply to groin area   Yes [provider]  Multiple Vitamin (MULTIVITAMIN WITH MINERALS) TABS tablet Take 1 tablet by mouth daily.   Yes [provider]  Skin Protectants, Misc. (EUCERIN) cream Apply 1 application topically daily.    Yes [provider]  vitamin B-12 (CYANOCOBALAMIN) 500 MCG tablet  Take 1,000 mcg by mouth 2 (two) times daily.   Yes [provider]  Vitamin D, Ergocalciferol, (DRISDOL) 50000 units CAPS capsule Take 50,000 Units by mouth every 7 (seven) days.   Yes [provider]  ammonium lactate (LAC-HYDRIN) 12 % lotion Apply 1 application topically daily. Apply lotion to legs and chest daily after shower    [provider]      VITAL SIGNS:  Blood pressure (!) 106/59, pulse (!) 102, temperature 99.2 F (37.3 C), resp. rate (!) 23, height 5\' 1"  (1.549 m), weight 68 kg, SpO2 100 %.  PHYSICAL  EXAMINATION:  Physical Exam  GENERAL:  37 y.o.-year-old patient lying in the bed with no acute distress.  EYES: Pupils equal, round, reactive to light and accommodation. No scleral icterus. Extraocular muscles intact.  HEENT: Head atraumatic, normocephalic. Oropharynx and nasopharynx clear. ETT in place. NECK:  Supple, no jugular venous distention. No thyroid enlargement, no tenderness.  LUNGS: Normal breath sounds bilaterally, no wheezing, rales,rhonchi or crepitation. No use of accessory muscles of respiration.  CARDIOVASCULAR: RRR, S1, S2 normal. No murmurs, rubs, or gallops.  ABDOMEN: Soft, nontender, nondistended. Bowel sounds present. No organomegaly or mass.  EXTREMITIES: No pedal edema, cyanosis, or clubbing.  NEUROLOGIC: unable to assess due to patient being intubated and sedated. PSYCHIATRIC: unable to assess SKIN: No obvious rash, lesion, or ulcer.   LABORATORY PANEL:   CBC Recent Labs  Lab 01/21/18 1645  WBC 9.1  HGB 13.2  HCT 36.6*  PLT 146*   ------------------------------------------------------------------------------------------------------------------  Chemistries  Recent Labs  Lab 01/21/18 1645  NA 138  K 4.7  CL 100  CO2 28  GLUCOSE 155*  BUN 16  CREATININE 1.64*  CALCIUM 8.8*  AST 66*  ALT 38  ALKPHOS 100  BILITOT 0.5   ------------------------------------------------------------------------------------------------------------------  Cardiac Enzymes Recent Labs  Lab 01/21/18 1645  TROPONINI 0.04*   ------------------------------------------------------------------------------------------------------------------  RADIOLOGY:  Ct Head Wo Contrast  Result Date: 01/21/2018 CLINICAL DATA:  Patient found unresponsive. EXAM: CT HEAD WITHOUT CONTRAST TECHNIQUE: Contiguous axial images were obtained from the base of the skull through the vertex without intravenous contrast. COMPARISON:  None. FINDINGS: Brain: No evidence of acute infarction,  hemorrhage, hydrocephalus, extra-axial collection or mass lesion/mass effect. Vascular: No hyperdense vessel or unexpected calcification. Skull: Normal. Negative for fracture or focal lesion. Sinuses/Orbits: No acute finding. Other: None. IMPRESSION: No cause for the patient's symptoms identified. Electronically Signed   By: Gerome Sam III M.D   On: 01/21/2018 18:34   Dg Chest Portable 1 View  Result Date: 01/21/2018 CLINICAL DATA:  Evaluate ET and OG tube. EXAM: PORTABLE CHEST 1 VIEW COMPARISON:  January 21, 2018 FINDINGS: The ETT terminates in the mid trachea. An OG tube is not visualized. No pneumothorax. No nodules or masses. Mild atelectasis in the left base. No other acute abnormalities noted. IMPRESSION: 1. No OG tube identified.  Recommend clinical correlation. 2. The ETT terminates in good position. 3. No other acute abnormalities. Electronically Signed   By: Gerome Sam III M.D   On: 01/21/2018 17:53   Dg Chest Port 1 View  Result Date: 01/21/2018 CLINICAL DATA:  Altered mental status EXAM: PORTABLE CHEST 1 VIEW COMPARISON:  12/21/2017 chest radiograph. FINDINGS: Intact sternotomy wires. Stable cardiomediastinal silhouette with top-normal heart size. No pneumothorax. No pleural effusion. Low lung volumes. There is diffuse prominence of the parahilar interstitial markings. IMPRESSION: Low lung volumes. Diffuse prominence of the parahilar interstitial markings, which could represent mild pulmonary edema or pneumonia. Recommend  follow-up PA and lateral chest radiographs. Electronically Signed   By: Delbert Phenix M.D.   On: 01/21/2018 17:39   Dg Abd Portable 1 View  Result Date: 01/21/2018 CLINICAL DATA:  OG tube placement EXAM: PORTABLE ABDOMEN - 1 VIEW COMPARISON:  12/21/2017 FINDINGS: Enteric tube terminates in the proximal gastric body. Nonobstructive bowel gas pattern. Visualized osseous structures are within normal limits. IMPRESSION: Enteric tube terminates in the proximal gastric  body. Electronically Signed   By: Charline Bills M.D.   On: 01/21/2018 19:06      IMPRESSION AND PLAN:   Sepsis- likely secondary to HCAP. CXR showed possible pneumonia vs pulmonary edema. Lactic acid 3.0. Intubated in the ED. - vent management per CCM - continue vancomycin and cefepime - blood cultures pending - trend lactic acid - check procalcitonin - repeat CXR in the morning  AMS- patient noted to be unresponsive in the ED, requiring intubation due to inability to protect his airway. Likely related to infection. - vent management and sedation per CCM  AKI- Cr 1.64. Likely prerenal in the setting of sepsis. - fluid resuscitation - avoid nephrotoxic agents - recheck bmp in the morning  Lactic acidosis- likely related to sepsis. - s/p 2L NS bolus - trend lactic acid  Down syndrome with history of aggressive behavior - continue cogentin and valium - psychiatry following  All the records are reviewed and case discussed with ED provider. Management plans discussed with the patient, family and they are in agreement.  CODE STATUS: Full  TOTAL TIME TAKING CARE OF THIS PATIENT: 45 minutes.    Jinny Blossom Brendy Ficek M.D on 01/21/2018 at 8:08 PM  Between 7am to 6pm - Pager - [phone]  After 6pm go to [email or web address] - Social research officer, government  [facility] [place] [facility]  Office  [phone]  CC: Primary care physician; Margaretann Loveless, MD   Note: This dictation was prepared with Dragon dictation along with smaller phrase technology. Any transcriptional errors that result from this process are unintentional.

## 2018-01-21 NOTE — Progress Notes (Signed)
   01/21/18 2310  Clinical Encounter Type  Visited With Patient and family together  Visit Type Follow-up;Spiritual support;Critical Care  Referral From Nurse  Consult/Referral To Chaplain  Spiritual Encounters  Spiritual Needs Prayer;Emotional;Grief support   [facility] visited with the family of Derrick Marshall as I was rounding on the ICU. The patient's sister was with him and was concerned about his breathing and fighting the vent. The nurse was giving further care to calm the patient so he could rest. I provided active listening and spiritual support.

## 2018-01-21 NOTE — ED Notes (Signed)
PT IVC/ PENDING PLACEMENT  

## 2018-01-21 NOTE — ED Notes (Addendum)
Writer spoke with Dr. Shaune Pollack to see pt as he was mumbling and had wet himself. Hard to understand when he spoke. Tech who had worked with him stated he usually was able to go to restroom.  MD in to see pt. Wrote for Head CT then discontinued it. Stated to hold am dose of Haldol which she later discontinued. Pt then barely ate breakfast and was observed resting in bed with eyes closed.

## 2018-01-21 NOTE — ED Notes (Signed)
Pt. Woke and stated he was hungry.  Pt. Asked if he wanted applesauce, pt. Stated yes and pt. Ate 4 oz of apple sauce.  Pt. Asked about new group home.

## 2018-01-21 NOTE — Progress Notes (Signed)
Notified Maggie, NP of BP 65/44 MAP 51. New orders received to titrate levophed previously ordered. See new orders entered.

## 2018-01-21 NOTE — ED Notes (Addendum)
Pt's sister in room at this time, update given to her. Sister is tearful.  Sister concerned that the patient has a blockage in his intestine states her uncle died that way. Informed her that an abdominal XR was already completed and the results are pending at this time.

## 2018-01-21 NOTE — ED Notes (Signed)
Pt given meal tray.

## 2018-01-21 NOTE — ED Notes (Signed)
Ivc /pending placement 

## 2018-01-21 NOTE — Progress Notes (Signed)
   01/21/18 1900  Clinical Encounter Type  Visited With Family;Patient not available  Visit Type Initial;Spiritual support;Critical Care;ED  Referral From Nurse  Consult/Referral To Chaplain  Spiritual Encounters  Spiritual Needs Prayer;Emotional;Other (Comment)   CH received a page from the ED for the patient in ED01. When I reported to the room I recognized the patient as Mr. Derrick Marshall who has been in the ED since 17 Dec 2017. The patient's sister Bonita Quin was in the room at bedside. The patient was unresponsive earlier today and a breathing tube and other critical care steps were taken. Bonita Quin who is also the legal garden of Mr. Thune, was upset and just could not understand why this happened. She stated that he has been treated badly over the last month and felt that because he has Down's Syndrome, that he was over-looked. I practiced active listening and offered sympathic support. The patient is being transferred to ICU. I will follow up once the patient is settled in the ICU.

## 2018-01-22 ENCOUNTER — Inpatient Hospital Stay: Payer: Medicare Other

## 2018-01-22 ENCOUNTER — Inpatient Hospital Stay (HOSPITAL_COMMUNITY)
Admit: 2018-01-22 | Discharge: 2018-01-22 | Disposition: A | Discharge status: Home / Self Care | Payer: Medicare Other | Attending: Pulmonary Disease | Admitting: Pulmonary Disease

## 2018-01-22 DIAGNOSIS — J9600 Acute respiratory failure, unspecified whether with hypoxia or hypercapnia: Secondary | ICD-10-CM

## 2018-01-22 DIAGNOSIS — G9340 Encephalopathy, unspecified: Secondary | ICD-10-CM

## 2018-01-22 DIAGNOSIS — R402433 Glasgow coma scale score 3-8, at hospital admission: Secondary | ICD-10-CM

## 2018-01-22 LAB — BLOOD GAS, ARTERIAL
ACID-BASE DEFICIT: 1.6 mmol/L (ref 0.0–2.0)
Bicarbonate: 24.3 mmol/L (ref 20.0–28.0)
FIO2: 0.24
O2 SAT: 97.4 %
PEEP/CPAP: 5 cmH2O
Patient temperature: 37
RATE: 16 resp/min
VT: 380 mL
pCO2 arterial: 45 mmHg (ref 32.0–48.0)
pH, Arterial: 7.34 — ABNORMAL LOW (ref 7.350–7.450)
pO2, Arterial: 100 mmHg (ref 83.0–108.0)

## 2018-01-22 LAB — CBC WITH DIFFERENTIAL/PLATELET
Basophils Absolute: 0 10*3/uL (ref 0–0.1)
Basophils Relative: 0 %
EOS ABS: 0 10*3/uL (ref 0–0.7)
EOS PCT: 0 %
HEMATOCRIT: 35.9 % — AB (ref 40.0–52.0)
Hemoglobin: 12.8 g/dL — ABNORMAL LOW (ref 13.0–18.0)
Lymphocytes Relative: 18 %
Lymphs Abs: 1.2 10*3/uL (ref 1.0–3.6)
MCH: 38.4 pg — ABNORMAL HIGH (ref 26.0–34.0)
MCHC: 35.8 g/dL (ref 32.0–36.0)
MCV: 107.4 fL — ABNORMAL HIGH (ref 80.0–100.0)
MONO ABS: 0.6 10*3/uL (ref 0.2–1.0)
MONOS PCT: 8 %
NEUTROS ABS: 5.1 10*3/uL (ref 1.4–6.5)
Neutrophils Relative %: 74 %
PLATELETS: 135 10*3/uL — AB (ref 150–440)
RBC: 3.34 MIL/uL — ABNORMAL LOW (ref 4.40–5.90)
RDW: 16.5 % — AB (ref 11.5–14.5)
WBC: 7 10*3/uL (ref 3.8–10.6)

## 2018-01-22 LAB — LACTIC ACID, PLASMA: Lactic Acid, Venous: 3.5 mmol/L (ref 0.5–1.9)

## 2018-01-22 LAB — PHOSPHORUS: Phosphorus: 2.9 mg/dL (ref 2.5–4.6)

## 2018-01-22 LAB — ECHOCARDIOGRAM COMPLETE
Height: 61 in
Weight: 2398.6 oz

## 2018-01-22 LAB — COMPREHENSIVE METABOLIC PANEL
ALBUMIN: 3.2 g/dL — AB (ref 3.5–5.0)
ALT: 35 U/L (ref 0–44)
ANION GAP: 7 (ref 5–15)
AST: 63 U/L — ABNORMAL HIGH (ref 15–41)
Alkaline Phosphatase: 93 U/L (ref 38–126)
BILIRUBIN TOTAL: 0.9 mg/dL (ref 0.3–1.2)
BUN: 15 mg/dL (ref 6–20)
CHLORIDE: 107 mmol/L (ref 98–111)
CO2: 26 mmol/L (ref 22–32)
Calcium: 8.3 mg/dL — ABNORMAL LOW (ref 8.9–10.3)
Creatinine, Ser: 1.19 mg/dL (ref 0.61–1.24)
GFR calc Af Amer: >60 mL/min (ref >60)
GLUCOSE: 113 mg/dL — AB (ref 70–99)
POTASSIUM: 4 mmol/L (ref 3.5–5.1)
Sodium: 140 mmol/L (ref 135–145)
TOTAL PROTEIN: 6.5 g/dL (ref 6.5–8.1)

## 2018-01-22 LAB — GLUCOSE, CAPILLARY
GLUCOSE-CAPILLARY: 90 mg/dL (ref 70–99)
Glucose-Capillary: 117 mg/dL — ABNORMAL HIGH (ref 70–99)
Glucose-Capillary: 118 mg/dL — ABNORMAL HIGH (ref 70–99)
Glucose-Capillary: 122 mg/dL — ABNORMAL HIGH (ref 70–99)
Glucose-Capillary: 126 mg/dL — ABNORMAL HIGH (ref 70–99)

## 2018-01-22 LAB — STREP PNEUMONIAE URINARY ANTIGEN: STREP PNEUMO URINARY ANTIGEN: NEGATIVE

## 2018-01-22 LAB — MAGNESIUM: MAGNESIUM: 2 mg/dL (ref 1.7–2.4)

## 2018-01-22 LAB — TROPONIN I
Troponin I: 0.65 ng/mL (ref <0.03)
Troponin I: 0.44 ng/mL (ref <0.03)

## 2018-01-22 LAB — TRIGLYCERIDES: TRIGLYCERIDES: 70 mg/dL (ref <150)

## 2018-01-22 MED ORDER — POLYETHYLENE GLYCOL 3350 17 G PO PACK
17.0000 g | PACK | Freq: Every day | ORAL | Status: DC
  Administered 2018-01-23: 17 g via NASOGASTRIC
  Filled 2018-01-22: qty 1

## 2018-01-22 MED ORDER — NOREPINEPHRINE 4 MG/250ML-% IV SOLN
0.0000 ug/min | INTRAVENOUS | Status: DC
  Administered 2018-01-22: 5 ug/min via INTRAVENOUS

## 2018-01-22 MED ORDER — SODIUM CHLORIDE 0.9 % IV SOLN
INTRAVENOUS | Status: DC | PRN
  Administered 2018-01-22: 250 mL via INTRAVENOUS
  Administered 2018-01-23: 500 mL via INTRAVENOUS
  Administered 2018-01-25: 250 mL via INTRAVENOUS

## 2018-01-22 MED ORDER — HALOPERIDOL LACTATE 5 MG/ML IJ SOLN
1.0000 mg | Freq: Four times a day (QID) | INTRAMUSCULAR | Status: DC | PRN
  Administered 2018-01-30 (×2): 1 mg via INTRAVENOUS
  Filled 2018-01-22 (×2): qty 1

## 2018-01-22 MED ORDER — NOREPINEPHRINE BITARTRATE 1 MG/ML IV SOLN
0.0000 ug/min | INTRAVENOUS | Status: DC
  Administered 2018-01-22: 9 ug/min via INTRAVENOUS
  Administered 2018-01-23: 7 ug/min via INTRAVENOUS
  Administered 2018-01-25: 2 ug/min via INTRAVENOUS
  Filled 2018-01-22 (×6): qty 4

## 2018-01-22 MED ORDER — SODIUM CHLORIDE 0.9 % IV BOLUS
500.0000 mL | Freq: Once | INTRAVENOUS | Status: AC
  Administered 2018-01-22: 500 mL via INTRAVENOUS

## 2018-01-22 MED ORDER — SENNA 8.6 MG PO TABS
2.0000 | ORAL_TABLET | Freq: Every day | ORAL | Status: DC | PRN
  Administered 2018-01-22: 17.2 mg via NASOGASTRIC
  Filled 2018-01-22: qty 2

## 2018-01-22 MED ORDER — ORAL CARE MOUTH RINSE
15.0000 mL | OROMUCOSAL | Status: DC
  Administered 2018-01-22 – 2018-01-26 (×42): 15 mL via OROMUCOSAL

## 2018-01-22 MED ORDER — CHLORHEXIDINE GLUCONATE 0.12% ORAL RINSE (MEDLINE KIT)
15.0000 mL | Freq: Two times a day (BID) | OROMUCOSAL | Status: DC
  Administered 2018-01-22: 15 mL via OROMUCOSAL

## 2018-01-22 MED ORDER — FAMOTIDINE IN NACL 20-0.9 MG/50ML-% IV SOLN
20.0000 mg | INTRAVENOUS | Status: DC
  Filled 2018-01-22: qty 50

## 2018-01-22 MED ORDER — LORAZEPAM 2 MG/ML IJ SOLN
0.5000 mg | INTRAMUSCULAR | Status: DC | PRN
  Administered 2018-01-24: 1 mg via INTRAVENOUS
  Filled 2018-01-22 (×3): qty 1

## 2018-01-22 MED ORDER — DEXMEDETOMIDINE HCL IN NACL 400 MCG/100ML IV SOLN
0.0000 ug/kg/h | INTRAVENOUS | Status: DC
  Administered 2018-01-22: 0.5 ug/kg/h via INTRAVENOUS
  Administered 2018-01-23: 0.9 ug/kg/h via INTRAVENOUS
  Administered 2018-01-23 – 2018-01-24 (×4): 0.4 ug/kg/h via INTRAVENOUS
  Administered 2018-01-25: 0.3 ug/kg/h via INTRAVENOUS
  Administered 2018-01-26: 0.5 ug/kg/h via INTRAVENOUS
  Filled 2018-01-22 (×7): qty 100

## 2018-01-22 MED ORDER — PROPOFOL 1000 MG/100ML IV EMUL
0.0000 ug/kg/min | INTRAVENOUS | Status: DC
  Administered 2018-01-22: 15 ug/kg/min via INTRAVENOUS
  Administered 2018-01-22: 5 ug/kg/min via INTRAVENOUS
  Administered 2018-01-23: 20 ug/kg/min via INTRAVENOUS
  Filled 2018-01-22 (×2): qty 100

## 2018-01-22 MED ORDER — DEXTROSE IN LACTATED RINGERS 5 % IV SOLN
INTRAVENOUS | Status: DC
  Administered 2018-01-22 – 2018-01-24 (×4): via INTRAVENOUS

## 2018-01-22 MED ORDER — BISACODYL 10 MG RE SUPP
10.0000 mg | Freq: Every day | RECTAL | Status: DC | PRN
  Administered 2018-01-22: 10 mg via RECTAL
  Filled 2018-01-22 (×2): qty 1

## 2018-01-22 NOTE — Progress Notes (Signed)
Notified Maggie, NP of elevated troponin at 0.65, acknowledged and no new orders at this time.

## 2018-01-22 NOTE — Progress Notes (Signed)
Patient arrived from ED on vent. ETT 23 at lips. Foley catheter draining clear yellow urine. OG tube clamped. Family/Legal guardian was with patient when admitted to ICU 20 and states that he ambulates, walks, and talks for himself at baseline,  Mainly needs propting with hygiene. Pt is from [place] and has been in the ED since 12/17/2017 as a behavioral patient.

## 2018-01-22 NOTE — Progress Notes (Addendum)
Legal guardian, Bonita Quin telephoned again requesting toxicology blood work to be drawn. Called pharmacy to get information on half life on haldol. Answered all questions asked by Bonita Quin to best of my ability. Maggie, NP aware of telephone call and then requested to speak to Bonita Quin, gave my phone to Seward Grater, NP to speak with Legal guardian, Bonita Quin.

## 2018-01-22 NOTE — Progress Notes (Signed)
Pt was weaned off sedation late this am-has remained off since d/t somnolence. Pt will briefly open eyes to physical and verbal stimulation. Pt has remained in NSR on cardiac monitor. BP has required Levophed-currently . Pt remains ventilated on PS 5/5 24%-pt has good volume and RR.  Legal guardian-Linda remained at bedside for majority of day. She expressed many concerns of his care this past month in the behavorial ED. A SZP to be filed. She has also requested psych med levels be drawn for suspected oversedation on behavorial ED staffs behalf-Dr Simonds made aware-no new orders at this time. Mrs Bonita Quin has been provided with means of filing a formal complaint at her request regarding the nursing care he received in the behavior ED. Mrs Bonita Quin has been made aware of the SZP that will be filed and expectations of Risk Management to contact her for further information regarding this situation.

## 2018-01-22 NOTE — Progress Notes (Addendum)
[facility] - [place] at [facility]   PATIENT NAME: Derrick Marshall    MR#:  [id number]  DATE OF BIRTH:  [birth year]  SUBJECTIVE:  CHIEF COMPLAINT:   Chief Complaint  Patient presents with  . Aggressive Behavior   Patient is on sedation and ventilation. REVIEW OF SYSTEMS:  Review of Systems  Unable to perform ROS: Intubated    DRUG ALLERGIES:   Allergies  Allergen Reactions  . Celexa [Citalopram Hydrobromide] Other (See Comments)    Aggressive   . Zyprexa [Olanzapine] Other (See Comments)    Aggressive behavior    VITALS:  Blood pressure (!) 87/44, pulse 85, temperature 98.8 F (37.1 C), resp. rate 17, height 5\' 1"  (1.549 m), weight 68 kg, SpO2 100 %. PHYSICAL EXAMINATION:  Physical Exam  HENT:  Head: Normocephalic.  Mouth/Throat: Oropharynx is clear and moist.  Eyes: No scleral icterus.  Neck: Neck supple. No JVD present. No tracheal deviation present.  Cardiovascular: Normal rate, regular rhythm and normal heart sounds. Exam reveals no gallop.  No murmur heard. Pulmonary/Chest: Effort normal and breath sounds normal. No respiratory distress. He has no wheezes. He has no rales.  Abdominal: Soft. Bowel sounds are normal. He exhibits no distension. There is no tenderness. There is no rebound.  Musculoskeletal: He exhibits no edema or tenderness.  Neurological:  Unable to exam.  Skin: No rash noted. No erythema.   LABORATORY PANEL:  Male CBC Recent Labs  Lab 01/22/18 0333  WBC 7.0  HGB 12.8*  HCT 35.9*  PLT 135*   ------------------------------------------------------------------------------------------------------------------ Chemistries  Recent Labs  Lab 01/22/18 0333  NA 140  K 4.0  CL 107  CO2 26  GLUCOSE 113*  BUN 15  CREATININE 1.19  CALCIUM 8.3*  MG 2.0  AST 63*  ALT 35  ALKPHOS 93  BILITOT 0.9   RADIOLOGY:  Ct Head Wo Contrast  Result Date: 01/21/2018 CLINICAL DATA:  Patient found unresponsive. EXAM: CT HEAD  WITHOUT CONTRAST TECHNIQUE: Contiguous axial images were obtained from the base of the skull through the vertex without intravenous contrast. COMPARISON:  None. FINDINGS: Brain: No evidence of acute infarction, hemorrhage, hydrocephalus, extra-axial collection or mass lesion/mass effect. Vascular: No hyperdense vessel or unexpected calcification. Skull: Normal. Negative for fracture or focal lesion. Sinuses/Orbits: No acute finding. Other: None. IMPRESSION: No cause for the patient's symptoms identified. Electronically Signed   By: Gerome Sam III M.D   On: 01/21/2018 18:34   US Venous Img Lower Bilateral  Result Date: 01/22/2018 CLINICAL DATA:  37 year old male with acute respiratory distress. EXAM: BILATERAL LOWER EXTREMITY VENOUS DOPPLER ULTRASOUND TECHNIQUE: Gray-scale sonography with graded compression, as well as color Doppler and duplex ultrasound were performed to evaluate the lower extremity deep venous systems from the level of the common femoral vein and including the common femoral, femoral, profunda femoral, popliteal and calf veins including the posterior tibial, peroneal and gastrocnemius veins when visible. The superficial great saphenous vein was also interrogated. Spectral Doppler was utilized to evaluate flow at rest and with distal augmentation maneuvers in the common femoral, femoral and popliteal veins. COMPARISON:  None. FINDINGS: RIGHT LOWER EXTREMITY Common Femoral Vein: No evidence of thrombus. Normal compressibility, respiratory phasicity and response to augmentation. Saphenofemoral Junction: No evidence of thrombus. Normal compressibility and flow on color Doppler imaging. Profunda Femoral Vein: No evidence of thrombus. Normal compressibility and flow on color Doppler imaging. Femoral Vein: No evidence of thrombus. Normal compressibility, respiratory phasicity and response to augmentation. Popliteal Vein: No evidence of  thrombus. Normal compressibility, respiratory phasicity and  response to augmentation. Calf Veins: No evidence of thrombus. Normal compressibility and flow on color Doppler imaging. Superficial Great Saphenous Vein: No evidence of thrombus. Normal compressibility. Venous Reflux:  None. Other Findings:  None. LEFT LOWER EXTREMITY Common Femoral Vein: No evidence of thrombus. Normal compressibility, respiratory phasicity and response to augmentation. Saphenofemoral Junction: No evidence of thrombus. Normal compressibility and flow on color Doppler imaging. Profunda Femoral Vein: No evidence of thrombus. Normal compressibility and flow on color Doppler imaging. Femoral Vein: No evidence of thrombus. Normal compressibility, respiratory phasicity and response to augmentation. Popliteal Vein: No evidence of thrombus. Normal compressibility, respiratory phasicity and response to augmentation. Calf Veins: No evidence of thrombus. Normal compressibility and flow on color Doppler imaging. Superficial Great Saphenous Vein: No evidence of thrombus. Normal compressibility. Venous Reflux:  None. Other Findings:  None. IMPRESSION: No evidence of deep venous thrombosis. Electronically Signed   By: Elgie Collard M.D.   On: 01/22/2018 01:08   Dg Chest Port 1 View  Result Date: 01/22/2018 CLINICAL DATA:  ACUTE RESPIRATORY FAILURE EXAM: PORTABLE CHEST 1 VIEW COMPARISON:  01/21/2018 FINDINGS: Tracheal tube unchanged. Introduction of NG tube with side port at the GE junction. Tip in the gastric body. Normal cardiac silhouette. Mild central venous congestion with mild interstitial edema. No pneumothorax. No pulmonary edema. IMPRESSION: 1. NG tube in stomach. 2. Mild central venous congestion and mild interstitial edema. Electronically Signed   By: Genevive Bi M.D.   On: 01/22/2018 08:15   Dg Chest Portable 1 View  Result Date: 01/21/2018 CLINICAL DATA:  Evaluate ET and OG tube. EXAM: PORTABLE CHEST 1 VIEW COMPARISON:  January 21, 2018 FINDINGS: The ETT terminates in the mid  trachea. An OG tube is not visualized. No pneumothorax. No nodules or masses. Mild atelectasis in the left base. No other acute abnormalities noted. IMPRESSION: 1. No OG tube identified.  Recommend clinical correlation. 2. The ETT terminates in good position. 3. No other acute abnormalities. Electronically Signed   By: Gerome Sam III M.D   On: 01/21/2018 17:53   Dg Chest Port 1 View  Result Date: 01/21/2018 CLINICAL DATA:  Altered mental status EXAM: PORTABLE CHEST 1 VIEW COMPARISON:  12/21/2017 chest radiograph. FINDINGS: Intact sternotomy wires. Stable cardiomediastinal silhouette with top-normal heart size. No pneumothorax. No pleural effusion. Low lung volumes. There is diffuse prominence of the parahilar interstitial markings. IMPRESSION: Low lung volumes. Diffuse prominence of the parahilar interstitial markings, which could represent mild pulmonary edema or pneumonia. Recommend follow-up PA and lateral chest radiographs. Electronically Signed   By: Delbert Phenix M.D.   On: 01/21/2018 17:39   Dg Abd Portable 1 View  Result Date: 01/21/2018 CLINICAL DATA:  OG tube placement EXAM: PORTABLE ABDOMEN - 1 VIEW COMPARISON:  12/21/2017 FINDINGS: Enteric tube terminates in the proximal gastric body. Nonobstructive bowel gas pattern. Visualized osseous structures are within normal limits. IMPRESSION: Enteric tube terminates in the proximal gastric body. Electronically Signed   By: Charline Bills M.D.   On: 01/21/2018 19:06   ASSESSMENT AND PLAN:   Acute respiratory failure with hypoxia due to sepsis- likely secondary to HCAP. CXR showed possible pneumonia vs pulmonary edema. Lactic acid 3.0. Intubated in the ED.  Try to wean off ventilation and extubation per intensivist. - continue cefepime and discontinue vancomycin. Follow-up blood cultures.  Hypotension.  IV fluid support, Levophed drip PRN.  Lactic acidosis.  Follow-up lactic acid.  Elevated troponin.  Possible due to demanding  ischemia.  Echocardiograph LV EF: 55% -   60%.  AMS- patient noted to be unresponsive in the ED, requiring intubation due to inability to protect his airway. Likely related to infection.  AKI- Cr was 1.64. Likely prerenal in the setting of sepsis. Improved with IV fluid support.  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 Management plans discussed with the patient's sister and they are in agreement.  CODE STATUS: Full Code  TOTAL TIME TAKING CARE OF THIS PATIENT: 38 minutes.   More than 50% of the time was spent in counseling/coordination of care: YES  POSSIBLE D/C IN 3 DAYS, DEPENDING ON CLINICAL CONDITION.   Shaune Pollack M.D on 01/22/2018 at 1:05 PM  Between 7am to 6pm - Pager - [phone]  After 6pm go to [email or web address] - Therapist, nutritional Hospitalists

## 2018-01-22 NOTE — Progress Notes (Signed)
LCSW went and consulted with ICU nurse and sat with patient while he was having a medical procedure done. Patient was intubated LCSW spoke words of encouragement to patient. LCSW will be available to family if needed today.  [employer]  [phone]

## 2018-01-22 NOTE — Progress Notes (Signed)
CRITICAL VALUE ALERT  Critical Value:  Lactic  Date & Time Notied:  0055  Provider Notified: Maggie, NP  Orders Received/Actions taken: No new orders received at this time.

## 2018-01-22 NOTE — Progress Notes (Signed)
Legal guardian, Bonita Quin, telephoned to express concerns about blood work being obtained to check drug levels. Expressed concern that results would not be the same the longer it took to draw blood from the patient. I assured her that I would speak with Maggie,NP and express her concerns. She stated that she had been requesting this blood work to be drawn since approx. 1300 today. She agreed to give me a chance to speak with Seward Grater, NP and stated that she would call back in about 15 minutes.

## 2018-01-22 NOTE — Progress Notes (Signed)
PULMONARY / CRITICAL CARE MEDICINE   Name: Derrick Marshall MRN: [id number] DOB: December 31, 1980    ADMISSION DATE:  12/17/2017 CONSULTATION DATE:  01/21/18  REFERRING MD:  Dr. Nancy Marus  CHIEF COMPLAINT:  Unresponsive  BRIEF SUMMARY: 37 y.o. Male who has been in the ED Behavorial unit at [facility] awaiting placement for approximately a month due to aggressive behavior.  On 9/28 he became unresponsive requiring intubation for airway protection.  CXR with concern for HCAP vs Pulmonary edema.  MAJOR EVENTS/TEST RESULTS: 08/24 Placed in Behavioral unit at [facility] ED, awaiting placement 09/28 found unresponsive, intubated for airway protection, admitted to ICU.  09/29 passed SBT but remained minimally responsive off of sedative infusions 09/28 CT Head: No acute findings 09/29 BLE venous US: No DVT 09/29 Echo: LVEF 55-60%, PASP 30-35 mmHg  INDWELLING DEVICES:: ETT 09/28 >>   MICRO DATA: MRSA PCR 09/28 >> NEG Blood 09/28 >>   ANTIMICROBIALS:  Vanc 09/28 >> 09/29 Cefepime 09/28 >> 09/29   SUBJECTIVE:  RASS -4, -5 on propofol gtt. RASS -3, -4 off of propofol x several hours. Passed SBT but too somnolent to extubate  VITAL SIGNS: BP (!) 87/44   Pulse 85   Temp 98.8 F (37.1 C)   Resp 17   Ht 5\' 1"  (1.549 m)   Wt 68 kg   SpO2 100%   BMI 28.33 kg/m   HEMODYNAMICS:    VENTILATOR SETTINGS: Vent Mode: PSV FiO2 (%):  [24 %] 24 % Set Rate:  [16 bmp] 16 bmp Vt Set:  [380 mL] 380 mL PEEP:  [5 cmH20] 5 cmH20 Pressure Support:  [5 cmH20] 5 cmH20 Plateau Pressure:  [12 cmH20-13 cmH20] 13 cmH20  INTAKE / OUTPUT: I/O last 3 completed shifts: In: 3262.9 [I.V.:1986.4; NG/GT:60; IV Piggyback:1216.6] Out: 600 [Urine:600]  PHYSICAL EXAMINATION: General: intubated, sedated, RASS -4 Neuro:  PERRL, Doll's eyes intact, minimal withdrawal HEENT: NCAT, sclerae white, ETT in place Cardiovascular:  Reg, II/IV syst M Lungs: few rhonchi Abdomen: ND, + BS, no masses, soft Ext: warm, no edema Skin: No  lesions noted  LABS:  BMET Recent Labs  Lab 01/20/18 2140 01/21/18 1645 01/21/18 2153 01/22/18 0333  NA 138 138  --  140  K 3.9 4.7  --  4.0  CL 104 100  --  107  CO2 30 28  --  26  BUN 11 16  --  15  CREATININE 0.83 1.64* 1.38* 1.19  GLUCOSE 98 155*  --  113*    Electrolytes Recent Labs  Lab 01/20/18 2140 01/21/18 1645 01/22/18 0333  CALCIUM 8.7* 8.8* 8.3*  MG  --   --  2.0  PHOS  --   --  2.9    CBC Recent Labs  Lab 01/21/18 1645 01/21/18 2153 01/22/18 0333  WBC 9.1 8.8 7.0  HGB 13.2 13.2 12.8*  HCT 36.6* 37.5* 35.9*  PLT 146* 150 135*    Coag's No results for input(s): APTT, INR in the last 168 hours.  Sepsis Markers Recent Labs  Lab 01/21/18 1832 01/21/18 2153 01/22/18 0010  LATICACIDVEN 2.4* 3.7* 3.5*  PROCALCITON  --  0.58  --     ABG Recent Labs  Lab 01/21/18 1822 01/22/18 0454  PHART 7.49* 7.34*  PCO2ART 31* 45  PO2ART 107 100    Liver Enzymes Recent Labs  Lab 01/21/18 1645 01/22/18 0333  AST 66* 63*  ALT 38 35  ALKPHOS 100 93  BILITOT 0.5 0.9  ALBUMIN 3.4* 3.2*    Cardiac Enzymes Recent  Labs  Lab 01/21/18 2153 01/22/18 0333 01/22/18 0941  TROPONINI 0.40* 0.65* 0.44*    Glucose Recent Labs  Lab 01/18/18 1716 01/21/18 2124 01/22/18 0108 01/22/18 0428 01/22/18 0727  GLUCAP 91 162* 90 117* 118*    CXR: minimal edema pattern   ASSESSMENT / PLAN:  PULMONARY A: VDRF. Intubated for depressed LOC P:   Cont full vent support - settings reviewed and/or adjusted Cont vent bundle Daily SBT if/when meets criteria  CARDIOVASCULAR A:  Hypotension H/O Tetralogy of Fallot, S/P surgical correction P:  ICU hemodynamic monitoring Norepinephrine as needed to maintain MAP > 65 mmHg  RENAL A:   AKI, improving P:   Monitor BMET intermittently Monitor I/Os Correct electrolytes as indicated  GASTROINTESTINAL A:   No acute issues P:   SUP: IV famotidine Consider TF protocol 09/30 if not  extubated  HEMATOLOGIC A:   Mild Thrombocytopenia P:  DVT px: enoxaparin Monitor CBC intermittently Transfuse per usual guidelines  INFECTIOUS A:   Mildly elevated PCT without definite source of infection P:   Monitor temp, WBC count Micro and abx as above Recheck PCT AM 09/30  ENDOCRINE A:   Mild hyperglycemia without hx of DM P:   Monitor glu on chem panels Consider SSI for glu > 160   NEUROLOGIC A:   Acute encephalopathy Down's syndrome Autism History of violent behvior P:   RASS goal: 0 to -1 Avoid sedating meds as able  Would consider dexmedetomidine if needed to facilitate   FAMILY  Sister updated in detail on 2 occasions during day    CCM time:45 mins The above time includes time spent in consultation with patient and/or family members and reviewing care plan on multidisciplinary rounds  Billy Fischer, MD PCCM service Mobile [phone] Pager [phone] 01/22/2018, 5:01 PM

## 2018-01-22 NOTE — Progress Notes (Signed)
Maggie, NP notified of 8 beat run of SVT. No new orders at present.

## 2018-01-22 NOTE — Progress Notes (Signed)
Notified Maggie, NP that patient is NPO with no CBG ordered. Verbal order received for CBG q 6 hours.

## 2018-01-23 ENCOUNTER — Inpatient Hospital Stay: Payer: Medicare Other

## 2018-01-23 DIAGNOSIS — J9601 Acute respiratory failure with hypoxia: Secondary | ICD-10-CM

## 2018-01-23 DIAGNOSIS — F71 Moderate intellectual disabilities: Secondary | ICD-10-CM

## 2018-01-23 LAB — CBC
HEMATOCRIT: 33.7 % — AB (ref 40.0–52.0)
Hemoglobin: 11.8 g/dL — ABNORMAL LOW (ref 13.0–18.0)
MCH: 37.7 pg — AB (ref 26.0–34.0)
MCHC: 35.1 g/dL (ref 32.0–36.0)
MCV: 107.6 fL — ABNORMAL HIGH (ref 80.0–100.0)
PLATELETS: 141 10*3/uL — AB (ref 150–440)
RBC: 3.13 MIL/uL — AB (ref 4.40–5.90)
RDW: 16.8 % — AB (ref 11.5–14.5)
WBC: 6.1 10*3/uL (ref 3.8–10.6)

## 2018-01-23 LAB — BASIC METABOLIC PANEL
ANION GAP: 3 — AB (ref 5–15)
BUN: 8 mg/dL (ref 6–20)
CHLORIDE: 107 mmol/L (ref 98–111)
CO2: 28 mmol/L (ref 22–32)
Calcium: 8.6 mg/dL — ABNORMAL LOW (ref 8.9–10.3)
Creatinine, Ser: 0.84 mg/dL (ref 0.61–1.24)
GFR calc Af Amer: >60 mL/min (ref >60)
GFR calc non Af Amer: >60 mL/min (ref >60)
Glucose, Bld: 141 mg/dL — ABNORMAL HIGH (ref 70–99)
POTASSIUM: 3.9 mmol/L (ref 3.5–5.1)
Sodium: 138 mmol/L (ref 135–145)

## 2018-01-23 LAB — GLUCOSE, CAPILLARY
GLUCOSE-CAPILLARY: 145 mg/dL — AB (ref 70–99)
GLUCOSE-CAPILLARY: 126 mg/dL — AB (ref 70–99)
GLUCOSE-CAPILLARY: 144 mg/dL — AB (ref 70–99)
Glucose-Capillary: 113 mg/dL — ABNORMAL HIGH (ref 70–99)

## 2018-01-23 LAB — PROCALCITONIN: Procalcitonin: 0.48 ng/mL

## 2018-01-23 MED ORDER — LACTATED RINGERS IV BOLUS
2000.0000 mL | Freq: Once | INTRAVENOUS | Status: AC
  Administered 2018-01-23: 2000 mL via INTRAVENOUS

## 2018-01-23 MED ORDER — DOCUSATE SODIUM 50 MG/5ML PO LIQD: 100.0000 mg | Freq: Two times a day (BID) | ORAL | Status: DC

## 2018-01-23 MED ORDER — LORAZEPAM 2 MG/ML IJ SOLN: 1.0000 mg | Freq: Once | INTRAMUSCULAR | Status: DC

## 2018-01-23 MED ORDER — VITAMIN B-12 100 MCG PO TABS
100.0000 ug | ORAL_TABLET | Freq: Every day | ORAL | Status: DC
  Administered 2018-01-23 – 2018-01-27 (×5): 100 ug
  Filled 2018-01-23 (×5): qty 1

## 2018-01-23 MED ORDER — VITAL HIGH PROTEIN PO LIQD: 1000.0000 mL | ORAL | Status: DC

## 2018-01-23 MED ORDER — ENOXAPARIN SODIUM 40 MG/0.4ML ~~LOC~~ SOLN
40.0000 mg | SUBCUTANEOUS | Status: DC
  Administered 2018-01-23 – 2018-02-01 (×10): 40 mg via SUBCUTANEOUS
  Filled 2018-01-23 (×10): qty 0.4

## 2018-01-23 MED ORDER — PRO-STAT SUGAR FREE PO LIQD
30.0000 mL | Freq: Every day | ORAL | Status: DC
  Administered 2018-01-23 – 2018-01-25 (×3): 30 mL

## 2018-01-23 MED ORDER — SODIUM CHLORIDE 0.9 % IV BOLUS
2000.0000 mL | Freq: Once | INTRAVENOUS | Status: AC
  Administered 2018-01-23: 2000 mL via INTRAVENOUS

## 2018-01-23 MED ORDER — VITAL 1.5 CAL PO LIQD
1000.0000 mL | ORAL | Status: DC
  Administered 2018-01-25 – 2018-01-26 (×2): 1000 mL

## 2018-01-23 MED ORDER — FAMOTIDINE 20 MG PO TABS
20.0000 mg | ORAL_TABLET | Freq: Two times a day (BID) | ORAL | Status: DC
  Administered 2018-01-23 – 2018-01-26 (×6): 20 mg
  Filled 2018-01-23 (×6): qty 1

## 2018-01-23 MED ORDER — SODIUM CHLORIDE 0.9 % IV SOLN
3.0000 g | Freq: Four times a day (QID) | INTRAVENOUS | Status: DC
  Administered 2018-01-23 – 2018-01-24 (×5): 3 g via INTRAVENOUS
  Filled 2018-01-23 (×8): qty 3

## 2018-01-23 MED ORDER — ADULT MULTIVITAMIN LIQUID CH
15.0000 mL | Freq: Every day | ORAL | Status: DC
  Administered 2018-01-23 – 2018-01-26 (×4): 15 mL
  Filled 2018-01-23 (×5): qty 15

## 2018-01-23 MED ORDER — DIAZEPAM 5 MG PO TABS
2.5000 mg | ORAL_TABLET | Freq: Every day | ORAL | Status: DC
  Administered 2018-01-23 – 2018-01-26 (×4): 2.5 mg
  Filled 2018-01-23 (×4): qty 1

## 2018-01-23 MED ORDER — SENNOSIDES-DOCUSATE SODIUM 8.6-50 MG PO TABS
1.0000 | ORAL_TABLET | Freq: Two times a day (BID) | ORAL | Status: DC
  Administered 2018-01-23 – 2018-01-28 (×8): 1
  Filled 2018-01-23 (×8): qty 1

## 2018-01-23 MED ORDER — CHLORHEXIDINE GLUCONATE 0.12% ORAL RINSE (MEDLINE KIT)
15.0000 mL | Freq: Two times a day (BID) | OROMUCOSAL | Status: DC
  Administered 2018-01-23 – 2018-01-27 (×8): 15 mL via OROMUCOSAL

## 2018-01-23 NOTE — Progress Notes (Signed)
   01/23/18 1001  Clinical Encounter Type  Visited With Patient not available  Visit Type Follow-up;Spiritual support  Referral From Nurse  Consult/Referral To Chaplain  Spiritual Encounters  Spiritual Needs Prayer   CH stopped by patient's room to follow up from Saturday night. The patient appears to be asleep from sedation. I spoke with the NT who has not seen family today. Derrick Marshall condition has not changed much since I last saw him.

## 2018-01-23 NOTE — Progress Notes (Signed)
Made Maggie, NP aware of the legal guardians concerns about obtaining blood from patient for a toxicology screen on the medications administered prior to patient being intubated. Clarified that there was a urine drug screen done and the legal guardian was still requesting a drug screen with blood. Maggie, NP acknowledged. No new orders at this time.

## 2018-01-23 NOTE — Progress Notes (Signed)
Pharmacy Antibiotic Note  Derrick Marshall is a 37 y.o. male admitted on 12/17/2017. Patient transferred to the ICU 9/28 after an episode of unresponsiveness requiring intubation for airway protection. Concern for possible aspiration PNA. Pharmacy has been consulted for Unasyn dosing.  Plan: Unasyn 3 g IV q6hr  Height: 5\' 1"  (154.9 cm) Weight: 149 lb 14.6 oz (68 kg) IBW/kg (Calculated) : 52.3  Temp (24hrs), Avg:98.8 F (37.1 C), Min:98.1 F (36.7 C), Max:100.4 F (38 C)  Recent Labs  Lab 01/20/18 2140 01/21/18 1645 01/21/18 1646 01/21/18 1832 01/21/18 2153 01/22/18 0010 01/22/18 0333 01/23/18 0415  WBC 5.5 9.1  --   --  8.8  --  7.0 6.1  CREATININE 0.83 1.64*  --   --  1.38*  --  1.19 0.84  LATICACIDVEN  --   --  3.0* 2.4* 3.7* 3.5*  --   --     Estimated Creatinine Clearance: 98.8 mL/min (by C-G formula based on SCr of 0.84 mg/dL).    Allergies  Allergen Reactions  . Celexa [Citalopram Hydrobromide] Other (See Comments)    Aggressive   . Zyprexa [Olanzapine] Other (See Comments)    Aggressive behavior     Antimicrobials this admission: Vancomycin 9/28 >> 9/29 Cefepime 9/28 >> 9/29 Unasyn 9/30 >>  Dose adjustments this admission: N/A  Microbiology results: 9/28 BCx: NG 12 hours 9/28 MRSA PCR: negative  Thank you for allowing pharmacy to be a part of this patient's care.  Pricilla Riffle, PharmD Pharmacy Resident  01/23/2018 12:02 PM

## 2018-01-23 NOTE — Progress Notes (Signed)
Pt. rr up to 35 after precedex turn off in attempt to wake pt. Up and extubate. Pt. Agitated with eyes closed,family member at bedside holding pt's hands and talkiing to pt.to calm pt. Down.Marland Kitchen HIGH RR ALARM UP TO 40 TO ACCOMODATE PT'S RATE AT 35,AND TO ALLOW TIME FOR PRECEDEX TO KICK BACK IN .Marland Kitchen

## 2018-01-23 NOTE — Progress Notes (Signed)
Pharmacy Electrolyte Monitoring Consult:  Pharmacy consulted to assist in monitoring and replacing electrolytes in this 37 y.o. male admitted on 12/17/2017 with Aggressive Behavior   Labs:  Sodium (mmol/L)  Date Value  01/23/2018 138   Potassium (mmol/L)  Date Value  01/23/2018 3.9   Magnesium (mg/dL)  Date Value  09/81/1914 2.0   Phosphorus (mg/dL)  Date Value  78/29/5621 2.9   Calcium (mg/dL)  Date Value  30/86/5784 8.6 (L)   Albumin (g/dL)  Date Value  69/62/9528 3.2 (L)    Assessment/Plan: Patient requiring multiple fluid boluses. No replacement warranted at this time.   Will replace to goal magnesium ~2 and goal potassium ~ 4 while critically ill.   Will recheck electrolytes with am labs.   Pharmacy will continue to monitor and adjust per consult.    Oplinger,Marbin L 01/23/2018 4:52 PM

## 2018-01-23 NOTE — Progress Notes (Signed)
Initial Nutrition Assessment  DOCUMENTATION CODES:   Not applicable  INTERVENTION:  Initiate Vital 1.5 at 45 mL/hr (1080 mL goal daily volume) + Pro-Stat 30 mL once daily via OGT. Provides 1720 kcal, 88 grams of protein, 821 mL H2O daily.  Provide liquid MVI daily per tube.  Provide free water flush of 30 mL Q4hrs to maintain tube patency.  NUTRITION DIAGNOSIS:   Inadequate oral intake related to inability to eat as evidenced by NPO status.  GOAL:   Provide needs based on ASPEN/SCCM guidelines  MONITOR:   Vent status, Labs, Weight trends, TF tolerance, I & O's  REASON FOR ASSESSMENT:   Ventilator    ASSESSMENT:   37 year old male with PMHx of Down syndrome, autism who was admitted with acute unresponsiveness secondary to polypharmacy requiring intubation on 9/28 for airway protection.   Patient intubated and sedated. On PRVC mode with FiO2 24% and PEEP 5 cmH2O. Abdomen soft. No family members present at time of RD assessment. Unsure if weight history or current weight is accurate as they are all exactly 68 kg.  Enteral Access: 14 Fr. OGT placed 9/28; terminates in proximal gastric body per abdominal x-ray on 9/28; tube marked at corner of mouth  MAP: 60-109 mmHg  Patient is currently intubated on ventilator support Ve: 6.1 L/min Temp (24hrs), Avg:99 F (37.2 C), Min:98.1 F (36.7 C), Max:100.9 F (38.3 C)  Propofol: N/A  Medications reviewed and include: Miralax, Unasyn, Precedex gtt, Dt-LR at 50 mL/hr, famotidine, norepinephrine gtt at 40mcg/min.  Labs reviewed: CBG 122-144, Anion gap 3.  I/O: 1575 mL UOP yesterday (1 mL/kg/hr)  Patient does not meet criteria for malnutrition at this time.  Discussed with RN and on rounds. Plan is to start tube feeds today if patient does not extubate.  NUTRITION - FOCUSED PHYSICAL EXAM:    Most Recent Value  Orbital Region  No depletion  Upper Arm Region  No depletion  Thoracic and Lumbar Region  No depletion  Buccal  Region  Unable to assess  Temple Region  No depletion  Clavicle Bone Region  No depletion  Clavicle and Acromion Bone Region  No depletion  Scapular Bone Region  Unable to assess  Dorsal Hand  No depletion  Patellar Region  Mild depletion  Anterior Thigh Region  Mild depletion  Posterior Calf Region  Moderate depletion  Edema (RD Assessment)  None  Hair  Reviewed  Eyes  Unable to assess  Mouth  Unable to assess  Skin  Reviewed  Nails  Reviewed     Diet Order:   Diet Order    None      EDUCATION NEEDS:   No education needs have been identified at this time  Skin:  Skin Assessment: Reviewed RN Assessment  Last BM:  01/23/2018 - type 4  Height:   Ht Readings from Last 1 Encounters:  12/17/17 5\' 1"  (1.549 m)    Weight:   Wt Readings from Last 1 Encounters:  12/17/17 68 kg    Ideal Body Weight:  50.9 kg  BMI:  Body mass index is 28.33 kg/m.  Estimated Nutritional Needs:   Kcal:  1780 (PSU 2003b w/ MSJ 1465, Ve 6.1, Tmax 38.3)  Protein:  82-102 grams/kg (1.2-1.5 grams/kg)  Fluid:  2 L/day (30 mL/kg)  Helane Rima, MS, RD, LDN Office: [phone] Pager: [phone] After Hours/Weekend Pager: [phone]

## 2018-01-23 NOTE — Progress Notes (Signed)
   [employer] -  at [facility]   PATIENT NAME: Derrick Marshall    MR#:  [id number]  DATE OF BIRTH:  1981-03-05  SUBJECTIVE:  Patient remains intubated and sedated REVIEW OF SYSTEMS:  Review of Systems  Unable to perform ROS: Intubated    DRUG ALLERGIES:   Allergies  Allergen Reactions  . Celexa [Citalopram Hydrobromide] Other (See Comments)    Aggressive   . Zyprexa [Olanzapine] Other (See Comments)    Aggressive behavior    VITALS:  Blood pressure 119/70, pulse 71, temperature 98.2 F (36.8 C), resp. rate 18, height 5\' 1"  (1.549 m), weight 67.2 kg, SpO2 98 %. PHYSICAL EXAMINATION:  Physical Exam  HENT:  Head: Normocephalic.  Mouth/Throat: Oropharynx is clear and moist.  ETT in place  Eyes: No scleral icterus.  Neck: Neck supple. No JVD present. No tracheal deviation present.  Cardiovascular: Normal rate, regular rhythm and normal heart sounds. Exam reveals no gallop.  No murmur heard. Pulmonary/Chest: Effort normal and breath sounds normal. No respiratory distress. He has no wheezes. He has no rales.  Abdominal: Soft. Bowel sounds are normal. He exhibits no distension. There is no tenderness. There is no rebound.  Musculoskeletal: He exhibits no edema or tenderness.  Neurological:  Unable to exam.  Skin: No rash noted. No erythema.   LABORATORY PANEL:  Male CBC Recent Labs  Lab 01/23/18 0415  WBC 6.1  HGB 11.8*  HCT 33.7*  PLT 141*   ------------------------------------------------------------------------------------------------------------------ Chemistries  Recent Labs  Lab 01/22/18 0333 01/23/18 0415  NA 140 138  K 4.0 3.9  CL 107 107  CO2 26 28  GLUCOSE 113* 141*  BUN 15 8  CREATININE 1.19 0.84  CALCIUM 8.3* 8.6*  MG 2.0  --   AST 63*  --   ALT 35  --   ALKPHOS 93  --   BILITOT 0.9  --    RADIOLOGY:  No results found. ASSESSMENT AND PLAN:   Acute respiratory failure with hypoxia and hypercapnia- likely  secondary to aspiration pneumonia vs HCAP. Procalcitonin elevated to 0.58. - vent management per CCM - initially on vanc/cefepime, now on unasyn  - f/u cultures  Septic shock- likely due to pneumonia. - on levophed - continue IVFs - may need stress dose steroids  Lactic acidosis- persistent, likely due to shock - IVFs  Elevated troponin- likely due to demand ischemia. ECHO with EF 55-60%.  AMS- continues to be sedated, may be related to oversedation and septic shock. - wake up assessment today  Down syndrome with history of aggressive behavior - continue cogentin and valium - psychiatry following  All the records are reviewed and case discussed with Care Management/Social Worker. Management plans discussed with the patient's sister and they are in agreement.  CODE STATUS: Full Code  TOTAL TIME TAKING CARE OF THIS PATIENT: 35 minutes.   More than 50% of the time was spent in counseling/coordination of care: YES  D/C UNCLEAR, DEPENDING ON CLINICAL CONDITION.   Jinny Blossom Lama Narayanan M.D on 01/23/2018 at 5:29 PM  Between 7am to 6pm - Pager [phone]  After 6pm go to [email or web address] - Therapist, nutritional Hospitalists

## 2018-01-23 NOTE — Progress Notes (Addendum)
CRITICAL CARE NOTE  CC  follow up respiratory failure  SUBJECTIVE Patient remains critically ill Prognosis is guarded   MAJOR EVENTS/TEST RESULTS: 08/24 Placed in Behavioral unit at [facility] ED, awaiting placement 09/28 found unresponsive, intubated for airway protection, admitted to ICU.  09/29 passed SBT but remained minimally responsive off of sedative infusions 09/28 CT Head: No acute findings 09/29 BLE venous US: No DVT 09/29 Echo: LVEF 55-60%, PASP 30-35 mmHg     BP (!) 95/50   Pulse 83   Temp 99 F (37.2 C)   Resp (!) 21   Ht _0  (1.549 m)   Wt 68 kg   SpO2 93%   BMI 28.33 kg/m    REVIEW OF SYSTEMS  PATIENT IS UNABLE TO PROVIDE COMPLETE REVIEW OF SYSTEMS DUE TO SEVERE CRITICAL ILLNESS   PHYSICAL EXAMINATION:  GENERAL:critically ill appearing, +resp distress HEAD: Normocephalic, atraumatic.  EYES: Pupils equal, round, reactive to light.  No scleral icterus.  MOUTH: Moist mucosal membrane. NECK: Supple. No thyromegaly. No nodules. No JVD.  PULMONARY: +rhonchi, +wheezing CARDIOVASCULAR: S1 and S2. Regular rate and rhythm. No murmurs, rubs, or gallops.  GASTROINTESTINAL: Soft, nontender, -distended. No masses. Positive bowel sounds. No hepatosplenomegaly.  MUSCULOSKELETAL: No swelling, clubbing, or edema.  NEUROLOGIC: obtunded, GCS<8 SKIN:intact,warm,dry      Indwelling Urinary Catheter continued, requirement due to   Reason to continue Indwelling Urinary Catheter for strict Intake/Output monitoring for hemodynamic instability         Ventilator continued, requirement due to, resp failure    Ventilator Sedation RASS 0 to -2     ASSESSMENT AND PLAN SYNOPSIS 37 yo white male with acute unresponsiveness secondary to polypharmacy  intubated for airway protection Need to wean off sedation and assess neuro status  After further discussion with sister, it seems that patient had progressive encephalopathy with abd pain and lethargy associated with  Septic shock  Severe Hypoxic and Hypercapnic Respiratory Failure -continue Full MV support -continue Bronchodilator Therapy -Wean Fio2 and PEEP as tolerated -will perform SAT/SBT when respiratory parameters are met   NEUROLOGY - intubated and sedated - minimal sedation to achieve a RASS goal: -1 Wake up assessment pending  Septic shock -use vasopressors to keep MAP>65 -follow up cultures -emperic ABX -consider stress dose steroids -Aggressive IVF's   CARDIAC ICU monitoring  ID -follow up cultures  GI/Nutrition GI PROPHYLAXIS as indicated DIET-->NPO Constipation protocol as indicated  ENDO - ICU hypoglycemic\Hyperglycemia protocol -check FSBS per protocol   ELECTROLYTES -follow labs as needed -replace as needed -pharmacy consultation and following   DVT/GI PRX ordered TRANSFUSIONS AS NEEDED MONITOR FSBS ASSESS the need for LABS as needed   Critical Care Time devoted to patient care services described in this note is 45 minutes.   Overall, patient is critically ill, prognosis is guarded.   Plan for sedation vacation and extubate if possible   Corrin Parker, M.D.  [facility]  Medical Director [place] Director [facility] Cardio-Pulmonary Department

## 2018-01-24 ENCOUNTER — Encounter: Payer: Self-pay | Admitting: Internal Medicine

## 2018-01-24 ENCOUNTER — Inpatient Hospital Stay: Payer: Medicare Other

## 2018-01-24 DIAGNOSIS — Q213 Tetralogy of Fallot: Secondary | ICD-10-CM

## 2018-01-24 DIAGNOSIS — I639 Cerebral infarction, unspecified: Secondary | ICD-10-CM

## 2018-01-24 LAB — PHOSPHORUS: Phosphorus: 2.9 mg/dL (ref 2.5–4.6)

## 2018-01-24 LAB — GLUCOSE, CAPILLARY
GLUCOSE-CAPILLARY: 115 mg/dL — AB (ref 70–99)
GLUCOSE-CAPILLARY: 91 mg/dL (ref 70–99)
Glucose-Capillary: 139 mg/dL — ABNORMAL HIGH (ref 70–99)
Glucose-Capillary: 78 mg/dL (ref 70–99)
Glucose-Capillary: 97 mg/dL (ref 70–99)

## 2018-01-24 LAB — MAGNESIUM: Magnesium: 1.7 mg/dL (ref 1.7–2.4)

## 2018-01-24 LAB — BASIC METABOLIC PANEL
ANION GAP: 8 (ref 5–15)
BUN: 10 mg/dL (ref 6–20)
CHLORIDE: 111 mmol/L (ref 98–111)
CO2: 24 mmol/L (ref 22–32)
Calcium: 8.3 mg/dL — ABNORMAL LOW (ref 8.9–10.3)
Creatinine, Ser: 0.65 mg/dL (ref 0.61–1.24)
GFR calc non Af Amer: >60 mL/min (ref >60)
GLUCOSE: 103 mg/dL — AB (ref 70–99)
Potassium: 4.1 mmol/L (ref 3.5–5.1)
Sodium: 143 mmol/L (ref 135–145)

## 2018-01-24 LAB — HIV ANTIBODY (ROUTINE TESTING W REFLEX): HIV SCREEN 4TH GENERATION: NONREACTIVE

## 2018-01-24 LAB — PROCALCITONIN: PROCALCITONIN: 0.26 ng/mL

## 2018-01-24 LAB — LEGIONELLA PNEUMOPHILA SEROGP 1 UR AG: L. pneumophila Serogp 1 Ur Ag: NEGATIVE

## 2018-01-24 MED ORDER — KETOTIFEN FUMARATE 0.025 % OP SOLN
1.0000 [drp] | Freq: Two times a day (BID) | OPHTHALMIC | Status: DC
  Administered 2018-01-24 – 2018-02-01 (×17): 1 [drp] via OPHTHALMIC
  Filled 2018-01-24: qty 5

## 2018-01-24 MED ORDER — SODIUM CHLORIDE 0.9 % IV SOLN
3.0000 g | Freq: Four times a day (QID) | INTRAVENOUS | Status: AC
  Administered 2018-01-24 – 2018-01-27 (×13): 3 g via INTRAVENOUS
  Filled 2018-01-24 (×14): qty 3

## 2018-01-24 MED ORDER — VITAL HIGH PROTEIN PO LIQD
1000.0000 mL | ORAL | Status: DC
  Administered 2018-01-24: 1000 mL

## 2018-01-24 MED ORDER — MAGNESIUM SULFATE 2 GM/50ML IV SOLN
2.0000 g | Freq: Once | INTRAVENOUS | Status: AC
  Administered 2018-01-24: 2 g via INTRAVENOUS
  Filled 2018-01-24: qty 50

## 2018-01-24 NOTE — Consult Note (Addendum)
Cardiology Consultation:   Patient ID: Derrick Marshall MRN: [id number]; DOB: [birth year]  Admit date: 12/17/2017 Date of Consult: 01/24/2018  Primary Care Provider: Perrin Maltese, MD Primary Cardiologist: New-consult by Flemon Kelty. Primary Electrophysiologist:  None    Patient Profile:   Derrick Marshall is a 37 y.o. 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 Mortimer Fries.  History of Present Illness:   Derrick Marshall is intubated and sedated.  History is obtained from the chart and the patient's sister/legal guardian.  Derrick Marshall was originally brought to the emergency department over a month ago due to aggressive behavior.  He had remained in the [facility] emergency department since that time, awaiting placement.  3 days ago, patient was noted to be unresponsive with elevated heart rate and low blood pressure.  He was intubated in the emergency department and subsequently admitted to the ICU for further management.  He had been complaining of mild cough and hoarseness as well as abdominal pain in the days leading up to this event.  Since his intubation, he has intermittently been quite agitated.  As part of his work-up, MRI of the brain was performed yesterday.  This was notable for punctate ischemic infarcts as well as findings concerning for acute hypoxic brain injury.  We have therefore been asked to perform a transesophageal echocardiogram.  Past Medical History:  Diagnosis Date  . Autism   . Down syndrome   . Tetralogy of Fallot     Past Surgical History:  Procedure Laterality Date  . TETRALOGY OF FALLOT REPAIR       Home Medications:  Prior to Admission medications   Medication Sig Start Date Brigida Scotti Date Taking? Authorizing Provider  benztropine (COGENTIN) 1 MG tablet Take 1 mg by mouth 2 (two) times daily.   Yes [provider]  bisacodyl (DULCOLAX) 10 MG suppository Place 10 mg rectally as needed for moderate constipation. Insert 1 suppository per rectum if no bowel movements in 48 hours   Yes [provider]  chlorhexidine (HIBICLENS) 4 % external liquid Apply 1 application topically once a week. Shower from head to toe weekly   Yes [provider]  diazepam (VALIUM) 5 MG tablet Take 1 tablet (5 mg total) by mouth daily at 2 PM. 12/12/17  Yes Clapacs, Madie Reno, MD  docusate sodium (COLACE) 100 MG capsule Take 100 mg by mouth 2 (two) times daily.   Yes [provider]  famotidine (PEPCID) 20 MG tablet Take 20 mg by mouth 2 (two) times daily before a meal.   Yes [provider]  haloperidol (HALDOL) 1 MG tablet Take 3 mg by mouth 2 (two) times daily. In the morning and at 1630   Yes [provider]  miconazole (MICOTIN) 2 % powder Apply topically daily. Apply powder to groin, abdomen and buttocks daily after shower   Yes [provider]  Miconazole Nitrate (ALOE VESTA ANTIFUNGAL) 2 % OINT Apply 1 application topically 2 (two) times daily as needed (itching). Apply to groin area   Yes [provider]  Multiple Vitamin (MULTIVITAMIN WITH MINERALS) TABS tablet Take 1 tablet by mouth daily.   Yes [provider]  Skin Protectants, Misc. (EUCERIN) cream Apply 1 application topically daily.    Yes [provider]  vitamin B-12 (CYANOCOBALAMIN) 500 MCG tablet Take 1,000 mcg  by mouth 2 (two) times daily.   Yes [provider]  Vitamin D, Ergocalciferol, (DRISDOL) 50000 units CAPS capsule Take 50,000 Units by mouth every 7 (seven) days.   Yes [provider]  ammonium lactate (LAC-HYDRIN) 12 % lotion Apply 1 application topically daily. Apply lotion to legs and chest daily after shower    [provider]    Inpatient Medications: Scheduled Meds: . benztropine  1 mg Oral BID  . chlorhexidine gluconate (MEDLINE KIT)  15 mL  Mouth Rinse BID  . diazepam  2.5 mg Per Tube Daily  . enoxaparin (LOVENOX) injection  40 mg Subcutaneous Q24H  . famotidine  20 mg Per Tube BID  . feeding supplement (PRO-STAT SUGAR FREE 64)  30 mL Per Tube Daily  . ketotifen  1 drop Both Eyes BID  . LORazepam  1 mg Intravenous Once  . mouth rinse  15 mL Mouth Rinse 10 times per day  . multivitamin  15 mL Per Tube Daily  . senna-docusate  1 tablet Per Tube BID  . vitamin B-12  100 mcg Per Tube Daily   Continuous Infusions: . sodium chloride 500 mL (01/23/18 2347)  . ampicillin-sulbactam (UNASYN) IV    . dexmedetomidine (PRECEDEX) IV infusion 0.4 mcg/kg/hr (01/24/18 1030)  . dextrose 5% lactated ringers 50 mL/hr at 01/24/18 0700  . feeding supplement (VITAL 1.5 CAL) Stopped (01/23/18 1707)  . norepinephrine (LEVOPHED) Adult infusion Stopped (01/23/18 1929)   PRN Meds: sodium chloride, bisacodyl, diazepam, docusate, haloperidol lactate, LORazepam  Allergies:    Allergies  Allergen Reactions  . Celexa [Citalopram Hydrobromide] Other (See Comments)    Aggressive   . Zyprexa [Olanzapine] Other (See Comments)    Aggressive behavior     Social History:   Unable to obtain, as the patient is intubated and sedated.  Family History:   Unable to obtain, as the patient is intubated and sedated.  ROS:  Unable to perform, as the patient is intubated and sedated.  Physical Exam/Data:   Vitals:   01/24/18 1100 01/24/18 1200 01/24/18 1257 01/24/18 1300  BP: 103/63 109/65  (!) 90/50  Pulse: 70 67 66 63  Resp: 14 14 14 15   Temp:   97.7 F (36.5 C)   TempSrc:   Oral   SpO2: 99% 99% 100% 99%  Weight:      Height:        Intake/Output Summary (Last 24 hours) at 01/24/2018 1430 Last data filed at 01/24/2018 1257 Gross per 24 hour  Intake 2739.81 ml  Output 2675 ml  Net 64.81 ml   Filed Weights   12/17/17 1609 01/23/18 1700 01/24/18 0324  Weight: 68 kg 67.2 kg 68 kg   Body mass index is 28.33 kg/m.  General:  Well-developed, well-nourished man, lying in ICU.  He is unresponsive and intubated. HEENT: Endotracheal and enteric tubes in place. Lymph: no adenopathy Neck: Unable to assess JVP secondary to positioning and support devices. Endocrine:  No thryomegaly Cardiac: Regular rate and rhythm with 2/6 systolic murmur loudest at the left lower sternal border.  No rubs or gallops. Lungs: Clear anteriorly. Abd: soft, nontender, no hepatomegaly  Ext: 1+ anasarca. Musculoskeletal:  No deformities.  Unable to assess strength as patient is intubated and sedated. Skin: warm and dry  Neuro: Intubated and sedated. Psych: Unable to assess, as the patient is intubated and sedated.  EKG:  The EKG was personally reviewed and demonstrates: Sinus tachycardia with right bundle branch block. Telemetry:  Telemetry was personally reviewed  and demonstrates: Sinus rhythm and sinus tachycardia.  Relevant CV Studies: Echo (01/22/18): - Procedure narrative: Transthoracic echocardiography for left   ventricular function evaluation, for right ventricular function   evaluation, and for assessment of valvular function. Image   quality was suboptimal. - Left ventricle: The cavity size was normal. There was mild focal   basal hypertrophy of the septum. Systolic function was normal.   The estimated ejection fraction was in the range of 55% to 60%.   Wall motion was normal; there were no regional wall motion   abnormalities. - Ventricular septum: Turbulent flow noted near the tricuspid   annulus, most likely representing eccentric TR. However, a   perimembranous septal defect cannot be excluded. - Right ventricle: The cavity size was mildly dilated. Wall   thickness was mildly increased. Systolic function was moderately   reduced. - Pulmonary arteries: Systolic pressure was at least mildly   increased, in the range of 30 mm Hg to 35 mm Hg plus central   venous/right atrial pressure.  Laboratory  Data:  Chemistry Recent Labs  Lab 01/22/18 0333 01/23/18 0415 01/24/18 0505  NA 140 138 143  K 4.0 3.9 4.1  CL 107 107 111  CO2 26 28 24   GLUCOSE 113* 141* 103*  BUN 15 8 10   CREATININE 1.19 0.84 0.65  CALCIUM 8.3* 8.6* 8.3*  GFRNONAA >60 >60 >60  GFRAA >60 >60 >60  ANIONGAP 7 3* 8    Recent Labs  Lab 01/21/18 1645 01/22/18 0333  PROT 6.8 6.5  ALBUMIN 3.4* 3.2*  AST 66* 63*  ALT 38 35  ALKPHOS 100 93  BILITOT 0.5 0.9   Hematology Recent Labs  Lab 01/21/18 2153 01/22/18 0333 01/23/18 0415  WBC 8.8 7.0 6.1  RBC 3.48* 3.34* 3.13*  HGB 13.2 12.8* 11.8*  HCT 37.5* 35.9* 33.7*  MCV 107.8* 107.4* 107.6*  MCH 38.0* 38.4* 37.7*  MCHC 35.2 35.8 35.1  RDW 16.2* 16.5* 16.8*  PLT 150 135* 141*   Cardiac Enzymes Recent Labs  Lab 01/21/18 1645 01/21/18 2153 01/22/18 0333 01/22/18 0941  TROPONINI 0.04* 0.40* 0.65* 0.44*   No results for input(s): TROPIPOC in the last 168 hours.  BNP Recent Labs  Lab 01/21/18 2153  BNP 119.0*    DDimer No results for input(s): DDIMER in the last 168 hours.  Radiology/Studies:  Ct Head Wo Contrast  Result Date: 01/21/2018 CLINICAL DATA:  Patient found unresponsive. EXAM: CT HEAD WITHOUT CONTRAST TECHNIQUE: Contiguous axial images were obtained from the base of the skull through the vertex without intravenous contrast. COMPARISON:  None. FINDINGS: Brain: No evidence of acute infarction, hemorrhage, hydrocephalus, extra-axial collection or mass lesion/mass effect. Vascular: No hyperdense vessel or unexpected calcification. Skull: Normal. Negative for fracture or focal lesion. Sinuses/Orbits: No acute finding. Other: None. IMPRESSION: No cause for the patient's symptoms identified. Electronically Signed   By: Dorise Bullion III M.D   On: 01/21/2018 18:34   Mr Brain Wo Contrast  Result Date: 01/23/2018 CLINICAL DATA:  Initial evaluation for altered mental status, unresponsiveness. History of acute respiratory failure with hypoxia and  hypercapnia. EXAM: MRI HEAD WITHOUT CONTRAST TECHNIQUE: Multiplanar, multiecho pulse sequences of the brain and surrounding structures were obtained without intravenous contrast. COMPARISON:  Prior CT from 01/13/2018. FINDINGS: Brain: Cerebral volume within normal limits for age. Few scattered subcentimeter T2/FLAIR hyperintensities noted within the periventricular and deep white matter both cerebral hemispheres, nonspecific. No significant cerebral white matter changes for age. There is symmetric abnormal restricted diffusion with associated  T2/FLAIR signal abnormality involving the globus pallidum bilaterally (series 2, image 31). Given provided history, findings most likely related to hypoxic injury. No associated hemorrhage identified. Additional punctate focus of restricted diffusion seen involving the cortical gray matter of the anterior left frontal lobe (series 4, image 32), consistent with a tiny acute ischemic cortical infarct. Additional small subcentimeter infarct noted within the left cerebellar hemisphere (series 4, image 15). No associated hemorrhage. No other evidence for acute or subacute ischemia. No mass lesion, midline shift or mass effect. No hydrocephalus. No extra-axial fluid collection. Pituitary gland within normal limits. Vascular: Major intracranial vascular flow voids maintained. Hypoplastic left vertebral artery not well seen. Skull and upper cervical spine: Craniocervical junction within normal limits. Mild multilevel cervical spondylolysis within the upper cervical spine without significant stenosis. No focal marrow replacing lesion. Scalp soft tissues unremarkable. Sinuses/Orbits: Globes and orbital soft tissues demonstrate no acute finding. Paranasal sinuses are clear. Fluid seen within the posterior nasopharynx. Patient is intubated. No appreciable mastoid effusion. Other: None. IMPRESSION: 1. Bilateral symmetric diffusion abnormality involving the globus pallidi bilaterally. Given  history of acute respiratory failure with hypoxia, findings favored to be secondary to acute hypoxic brain injury. Carbon monoxide poisoning or other toxic metabolic derangement could also be considered. 2. Few punctate ischemic infarcts involving the anterior left frontal cortical gray matter and left cerebellum as above. 3. Otherwise normal brain MRI. Electronically Signed   By: Jeannine Boga M.D.   On: 01/23/2018 22:43   US Venous Img Lower Bilateral  Result Date: 01/22/2018 CLINICAL DATA:  37 year old male with acute respiratory distress. EXAM: BILATERAL LOWER EXTREMITY VENOUS DOPPLER ULTRASOUND TECHNIQUE: Gray-scale sonography with graded compression, as well as color Doppler and duplex ultrasound were performed to evaluate the lower extremity deep venous systems from the level of the common femoral vein and including the common femoral, femoral, profunda femoral, popliteal and calf veins including the posterior tibial, peroneal and gastrocnemius veins when visible. The superficial great saphenous vein was also interrogated. Spectral Doppler was utilized to evaluate flow at rest and with distal augmentation maneuvers in the common femoral, femoral and popliteal veins. COMPARISON:  None. FINDINGS: RIGHT LOWER EXTREMITY Common Femoral Vein: No evidence of thrombus. Normal compressibility, respiratory phasicity and response to augmentation. Saphenofemoral Junction: No evidence of thrombus. Normal compressibility and flow on color Doppler imaging. Profunda Femoral Vein: No evidence of thrombus. Normal compressibility and flow on color Doppler imaging. Femoral Vein: No evidence of thrombus. Normal compressibility, respiratory phasicity and response to augmentation. Popliteal Vein: No evidence of thrombus. Normal compressibility, respiratory phasicity and response to augmentation. Calf Veins: No evidence of thrombus. Normal compressibility and flow on color Doppler imaging. Superficial Great Saphenous  Vein: No evidence of thrombus. Normal compressibility. Venous Reflux:  None. Other Findings:  None. LEFT LOWER EXTREMITY Common Femoral Vein: No evidence of thrombus. Normal compressibility, respiratory phasicity and response to augmentation. Saphenofemoral Junction: No evidence of thrombus. Normal compressibility and flow on color Doppler imaging. Profunda Femoral Vein: No evidence of thrombus. Normal compressibility and flow on color Doppler imaging. Femoral Vein: No evidence of thrombus. Normal compressibility, respiratory phasicity and response to augmentation. Popliteal Vein: No evidence of thrombus. Normal compressibility, respiratory phasicity and response to augmentation. Calf Veins: No evidence of thrombus. Normal compressibility and flow on color Doppler imaging. Superficial Great Saphenous Vein: No evidence of thrombus. Normal compressibility. Venous Reflux:  None. Other Findings:  None. IMPRESSION: No evidence of deep venous thrombosis. Electronically Signed   By: Laren Everts.D.  On: 01/22/2018 01:08   Dg Chest Port 1 View  Result Date: 01/22/2018 CLINICAL DATA:  ACUTE RESPIRATORY FAILURE EXAM: PORTABLE CHEST 1 VIEW COMPARISON:  01/21/2018 FINDINGS: Tracheal tube unchanged. Introduction of NG tube with side port at the GE junction. Tip in the gastric body. Normal cardiac silhouette. Mild central venous congestion with mild interstitial edema. No pneumothorax. No pulmonary edema. IMPRESSION: 1. NG tube in stomach. 2. Mild central venous congestion and mild interstitial edema. Electronically Signed   By: Suzy Bouchard M.D.   On: 01/22/2018 08:15   Dg Chest Portable 1 View  Result Date: 01/21/2018 CLINICAL DATA:  Evaluate ET and OG tube. EXAM: PORTABLE CHEST 1 VIEW COMPARISON:  January 21, 2018 FINDINGS: The ETT terminates in the mid trachea. An OG tube is not visualized. No pneumothorax. No nodules or masses. Mild atelectasis in the left base. No other acute abnormalities noted.  IMPRESSION: 1. No OG tube identified.  Recommend clinical correlation. 2. The ETT terminates in good position. 3. No other acute abnormalities. Electronically Signed   By: Dorise Bullion III M.D   On: 01/21/2018 17:53   Dg Chest Port 1 View  Result Date: 01/21/2018 CLINICAL DATA:  Altered mental status EXAM: PORTABLE CHEST 1 VIEW COMPARISON:  12/21/2017 chest radiograph. FINDINGS: Intact sternotomy wires. Stable cardiomediastinal silhouette with top-normal heart size. No pneumothorax. No pleural effusion. Low lung volumes. There is diffuse prominence of the parahilar interstitial markings. IMPRESSION: Low lung volumes. Diffuse prominence of the parahilar interstitial markings, which could represent mild pulmonary edema or pneumonia. Recommend follow-up PA and lateral chest radiographs. Electronically Signed   By: Ilona Sorrel M.D.   On: 01/21/2018 17:39   Dg Abd Portable 1 View  Result Date: 01/21/2018 CLINICAL DATA:  OG tube placement EXAM: PORTABLE ABDOMEN - 1 VIEW COMPARISON:  12/21/2017 FINDINGS: Enteric tube terminates in the proximal gastric body. Nonobstructive bowel gas pattern. Visualized osseous structures are within normal limits. IMPRESSION: Enteric tube terminates in the proximal gastric body. Electronically Signed   By: Julian Hy M.D.   On: 01/21/2018 19:06    Assessment and Plan:   Multifocal stroke Punctate areas of restricted diffusion noted in multiple vascular distributions on recent MRI of the brain.  Findings certainly could represent a cardioembolic source.  Given concern for anoxic brain injury on MRI, I do not think that the strokes alone account for the patient's altered sensorium.  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  We will plan to proceed with transesophageal echocardiogram tomorrow morning at 8 AM (01/25/2018).   Patient will be n.p.o. after midnight with discontinuation of tube feeds.  I have discussed the procedure, its risks, and its benefits with Mr. Bartell sister and legal guardian, Azzie Glatter.  She has provided consent.  Consider adding antiplatelet therapy for secondary prevention of stroke.  Would favor aspirin 81 mg daily.  Tetralogy of Fallot Unclear if the patient has been following with a cardiologist in adulthood.  It does not sound like he had any overt symptoms of heart failure leading up to his recent hospitalization.  Transthoracic echo showed evidence of RVH and RV dysfunction, though there was not significant pulmonic valve/subpulmonic stenosis.  LVEF was preserved.  Proceed with TEE, as above.  Going forward, prophylactic antibiotics are recommended for SBE prophylaxis for dental procedures.  Based on patient's clinical course, outpatient follow-up with an adult  congenital heart specialist is recommended.    For questions or updates, please contact [facility] Please consult [email or web address] for contact info under [facility] cardiology.  Signed, Nelva Bush, MD  01/24/2018 2:30 PM

## 2018-01-24 NOTE — Consult Note (Addendum)
Referring Physician: Sela Hua, MD    Chief Complaint: Altered mental status  HPI: Price Lachapelle is an 37 y.o. male with significant history of autism, Down syndrome, behavioral disturbances admitted to the ED behavioral unit since 12/17/2017 due to aggressive behavior.  Patient unable to provide history so most of the history was obtained from patient's chart.  Per ED notes, on 01/21/2018 at about 4 PM patient was noted to be unresponsive with decreased oxygen saturations, he was also hypotensive and tachycardic.  IV fluids bolus was started and patient was placed on a non-rebreather to improve saturations. Due to GCS of 3 and minimal gag patient was intubated for airway protection and started on vasoactive drip for hypotension.  He was transferred to the ICU for further monitoring.  Work-up thus far including recent labs showed elevated lactic acid levels, urine toxicology positive for benzodiazepine, abnormal blood gas showing respiratory alkalosis.  He has also had chest x-ray showing possible mild pulmonary edema versus pneumonia, CT head did not show acute intracranial abnormality, echocardiogram did not show acute finding other than questionable septal defect.  He had a follow-up MRI of the brain which showed bilateral symmetric diffusion abnormality involving the globus pallidus bilaterally and few punctate ischemic infarcts involving the anterior left frontal cortical gray matter and left cerebellum.  Date last known well: Date: 01/21/2018  Time last known well: Unable to determine tPA Given: No: outside window period  Past Medical History:  Diagnosis Date  . Autism   . Down syndrome     History reviewed. No pertinent surgical history.  No family history on file. Unable to obtain from patient due to mental status  Social History:  reports that he has never smoked. He has never used smokeless tobacco. He reports that he does not drink alcohol or use drugs.  Allergies:  Allergies   Allergen Reactions  . Celexa [Citalopram Hydrobromide] Other (See Comments)    Aggressive   . Zyprexa [Olanzapine] Other (See Comments)    Aggressive behavior     Medications:  I have reviewed the patient's current medications. Prior to Admission:  Medications Prior to Admission  Medication Sig Dispense Refill Last Dose  . benztropine (COGENTIN) 1 MG tablet Take 1 mg by mouth 2 (two) times daily.   unknown at unknown  . bisacodyl (DULCOLAX) 10 MG suppository Place 10 mg rectally as needed for moderate constipation. Insert 1 suppository per rectum if no bowel movements in 48 hours   unknown at unknown  . chlorhexidine (HIBICLENS) 4 % external liquid Apply 1 application topically once a week. Shower from head to toe weekly   unknown at unknown  . diazepam (VALIUM) 5 MG tablet Take 1 tablet (5 mg total) by mouth daily at 2 PM. 30 tablet 0 unknown at unknown  . docusate sodium (COLACE) 100 MG capsule Take 100 mg by mouth 2 (two) times daily.   unknown at unknown  . famotidine (PEPCID) 20 MG tablet Take 20 mg by mouth 2 (two) times daily before a meal.   unknown at unknown  . haloperidol (HALDOL) 1 MG tablet Take 3 mg by mouth 2 (two) times daily. In the morning and at 1630   unknown at unknown  . miconazole (MICOTIN) 2 % powder Apply topically daily. Apply powder to groin, abdomen and buttocks daily after shower   unknown at unknown  . Miconazole Nitrate (ALOE VESTA ANTIFUNGAL) 2 % OINT Apply 1 application topically 2 (two) times daily as needed (itching). Apply to  groin area   unknown at unknown  . Multiple Vitamin (MULTIVITAMIN WITH MINERALS) TABS tablet Take 1 tablet by mouth daily.   unknown at unknown  . Skin Protectants, Misc. (EUCERIN) cream Apply 1 application topically daily.    unknown at unknown  . vitamin B-12 (CYANOCOBALAMIN) 500 MCG tablet Take 1,000 mcg by mouth 2 (two) times daily.   unknown at unknown  . Vitamin D, Ergocalciferol, (DRISDOL) 50000 units CAPS capsule Take  50,000 Units by mouth every 7 (seven) days.   unknown at unknown  . ammonium lactate (LAC-HYDRIN) 12 % lotion Apply 1 application topically daily. Apply lotion to legs and chest daily after shower   unknown at unknown   Scheduled: . benztropine  1 mg Oral BID  . chlorhexidine gluconate (MEDLINE KIT)  15 mL Mouth Rinse BID  . diazepam  2.5 mg Per Tube Daily  . enoxaparin (LOVENOX) injection  40 mg Subcutaneous Q24H  . famotidine  20 mg Per Tube BID  . feeding supplement (PRO-STAT SUGAR FREE 64)  30 mL Per Tube Daily  . LORazepam  1 mg Intravenous Once  . mouth rinse  15 mL Mouth Rinse 10 times per day  . multivitamin  15 mL Per Tube Daily  . senna-docusate  1 tablet Per Tube BID  . vitamin B-12  100 mcg Per Tube Daily    ROS: Unable to obtain due to current intubation and developmental disability  Physical Exam   Vitals Blood pressure 109/65, pulse 67, temperature 97.7 F (36.5 C), temperature source Oral, resp. rate 14, height _0  (1.549 m), weight 68 kg, SpO2 99 %.    HEENT-  Normocephalic, no lesions, without obvious abnormality.  Normal external eye and conjunctiva. Normal TM's bilaterally.  Normal auditory canals and external ears. Normal external nose, mucus membranes and septum.  Normal pharynx. Cardiovascular- S1, S2 normal, pulses palpable throughout   Lungs- chest clear, no wheezing, rales, normal symmetric air entry Abdomen- soft, non-tender; bowel sounds normal; no masses,  no organomegaly Extremities- no edema Lymph-no adenopathy palpable Musculoskeletal-no joint tenderness, deformity or swelling Skin-warm and dry, no hyperpigmentation, vitiligo, or suspicious lesions  Neurological Exam   Mental Status: Awake, Intubated and sedated. Opens eyes spontaneously to verbal stimuli.priate. Unable to follow commands. Cranial Nerves: II: Discs flat bilaterally; blinks to visual field threats, pupils equal, round, reactive to light and accommodation III,IV, VI: ptosis  not present, dysconjugate extraocular movements V,VII: Intact corneals bilaterally VIII: unable to test IX,X: gag reflex present XI: Unable to assess XII: Unable to assess Motor: Moves  bilateral Upper extremity to painful stimuli but does not cross midline        Withdraws both lower extremities to painful stimuli Sensory: Responds to noxious stimuli throughout Deep Tendon Reflexes: 2+ in the upper extremities and bilateral knee jerks.  2+ left ankle jerk.  4+ right ankle jerk with sustained clonus.   Plantars: Equivocal bilaterally Cerebellar: Unable to assess Gait: not tested due to safety concerns   Data Reviewed  Laboratory Studies:  Basic Metabolic Panel: Recent Labs  Lab 01/20/18 2140 01/21/18 1645 01/21/18 2153 01/22/18 0333 01/23/18 0415 01/24/18 0505  NA 138 138  --  140 138 143  K 3.9 4.7  --  4.0 3.9 4.1  CL 104 100  --  107 107 111  CO2 30 28  --  _1 GLUCOSE 98 155*  --  113* 141* 103*  BUN 11 16  --  _2 CREATININE  0.83 1.64* 1.38* 1.19 0.84 0.65  CALCIUM 8.7* 8.8*  --  8.3* 8.6* 8.3*  MG  --   --   --  2.0  --  1.7  PHOS  --   --   --  2.9  --  2.9    Liver Function Tests: Recent Labs  Lab 01/21/18 1645 01/22/18 0333  AST 66* 63*  ALT 38 35  ALKPHOS 100 93  BILITOT 0.5 0.9  PROT 6.8 6.5  ALBUMIN 3.4* 3.2*   No results for input(s): LIPASE, AMYLASE in the last 168 hours. No results for input(s): AMMONIA in the last 168 hours.  CBC: Recent Labs  Lab 01/20/18 2140 01/21/18 1645 01/21/18 2153 01/22/18 0333 01/23/18 0415  WBC 5.5 9.1 8.8 7.0 6.1  NEUTROABS 4.4  --   --  5.1  --   HGB 13.6 13.2 13.2 12.8* 11.8*  HCT 38.3* 36.6* 37.5* 35.9* 33.7*  MCV 106.0* 108.6* 107.8* 107.4* 107.6*  PLT 132* 146* 150 135* 141*    Cardiac Enzymes: Recent Labs  Lab 01/21/18 1645 01/21/18 2153 01/22/18 0333 01/22/18 0941  TROPONINI 0.04* 0.40* 0.65* 0.44*    BNP: Invalid input(s): POCBNP  CBG: Recent Labs  Lab 01/23/18 0631  01/23/18 1141 01/23/18 1631 01/23/18 2354 01/24/18 0532  GLUCAP 126* 144* 113* 78 27    Microbiology: Results for orders placed or performed during the hospital encounter of 12/17/17  Blood culture (routine x 2)     Status: None (Preliminary result)   Collection Time: 01/21/18  6:25 PM  Result Value Ref Range Status   Specimen Description BLOOD Blood Culture adequate volume  Final   Special Requests   Final    BOTTLES DRAWN AEROBIC AND ANAEROBIC LEFT ANTECUBITAL   Culture   Final    NO GROWTH 3 DAYS Performed at [facility], [address] [place], [place] [address]    Report Status PENDING  Incomplete  Blood culture (routine x 2)     Status: None (Preliminary result)   Collection Time: 01/21/18  6:32 PM  Result Value Ref Range Status   Specimen Description BLOOD Blood Culture adequate volume  Final   Special Requests   Final    BOTTLES DRAWN AEROBIC AND ANAEROBIC RIGHT ANTECUBITAL   Culture   Final    NO GROWTH 3 DAYS Performed at [facility], [address] [place], [place] [address]    Report Status PENDING  Incomplete  MRSA PCR Screening     Status: None   Collection Time: 01/21/18  9:36 PM  Result Value Ref Range Status   MRSA by PCR NEGATIVE NEGATIVE Final    Comment:        The GeneXpert MRSA Assay (FDA approved for NASAL specimens only), is one component of a comprehensive MRSA colonization surveillance program. It is not intended to diagnose MRSA infection nor to guide or monitor treatment for MRSA infections. Performed at [facility], [address] [place], [place] [address]     Coagulation Studies: No results for input(s): LABPROT, INR in the last 72 hours.  Urinalysis:  Recent Labs  Lab 01/21/18 0706  COLORURINE YELLOW*  LABSPEC 1.021  PHURINE 6.0  GLUCOSEU NEGATIVE  HGBUR NEGATIVE  BILIRUBINUR NEGATIVE  KETONESUR NEGATIVE  PROTEINUR NEGATIVE  NITRITE NEGATIVE  LEUKOCYTESUR NEGATIVE    Lipid Panel:     Component Value Date/Time   TRIG 70 01/21/2018 2203    HgbA1C: No results found for: HGBA1C  Urine Drug Screen:  Component Value Date/Time   LABOPIA NONE DETECTED 01/21/2018 1728   COCAINSCRNUR NONE DETECTED 01/21/2018 1728   LABBENZ POSITIVE (A) 01/21/2018 1728   AMPHETMU NONE DETECTED 01/21/2018 1728   THCU NONE DETECTED 01/21/2018 1728   LABBARB NONE DETECTED 01/21/2018 1728    Alcohol Level: No results for input(s): ETH in the last 168 hours.  Other results: EKG: sinus tachycardia at 127 bpm.  Imaging: Mr Brain Wo Contrast  Result Date: 01/23/2018 CLINICAL DATA:  Initial evaluation for altered mental status, unresponsiveness. History of acute respiratory failure with hypoxia and hypercapnia. EXAM: MRI HEAD WITHOUT CONTRAST TECHNIQUE: Multiplanar, multiecho pulse sequences of the brain and surrounding structures were obtained without intravenous contrast. COMPARISON:  Prior CT from 01/13/2018. FINDINGS: Brain: Cerebral volume within normal limits for age. Few scattered subcentimeter T2/FLAIR hyperintensities noted within the periventricular and deep white matter both cerebral hemispheres, nonspecific. No significant cerebral white matter changes for age. There is symmetric abnormal restricted diffusion with associated T2/FLAIR signal abnormality involving the globus pallidum bilaterally (series 2, image 31). Given provided history, findings most likely related to hypoxic injury. No associated hemorrhage identified. Additional punctate focus of restricted diffusion seen involving the cortical gray matter of the anterior left frontal lobe (series 4, image 32), consistent with a tiny acute ischemic cortical infarct. Additional small subcentimeter infarct noted within the left cerebellar hemisphere (series 4, image 15). No associated hemorrhage. No other evidence for acute or subacute ischemia. No mass lesion, midline shift or mass effect. No hydrocephalus. No extra-axial fluid  collection. Pituitary gland within normal limits. Vascular: Major intracranial vascular flow voids maintained. Hypoplastic left vertebral artery not well seen. Skull and upper cervical spine: Craniocervical junction within normal limits. Mild multilevel cervical spondylolysis within the upper cervical spine without significant stenosis. No focal marrow replacing lesion. Scalp soft tissues unremarkable. Sinuses/Orbits: Globes and orbital soft tissues demonstrate no acute finding. Paranasal sinuses are clear. Fluid seen within the posterior nasopharynx. Patient is intubated. No appreciable mastoid effusion. Other: None. IMPRESSION: 1. Bilateral symmetric diffusion abnormality involving the globus pallidi bilaterally. Given history of acute respiratory failure with hypoxia, findings favored to be secondary to acute hypoxic brain injury. Carbon monoxide poisoning or other toxic metabolic derangement could also be considered. 2. Few punctate ischemic infarcts involving the anterior left frontal cortical gray matter and left cerebellum as above. 3. Otherwise normal brain MRI. Electronically Signed   By: Jeannine Boga M.D.   On: 01/23/2018 22:43    Assessment: 37 y.o. male found unresponsive in the ED.  Had required antipsychotics and benzodiazepines for combative behavior.  When found patient hypotensive, tachycardic and hypoxic.  MRI of the brain reviewed and shows diffusion abnormalities in the globus pallidus bilaterally and in multiple punctate areas bilaterally suggestive of acute infarcts.  Etiology likely hypotensive but can not rule out a concurrent embolic etiology.  Patient now intubated and sedated.  Has some responses but unable to evaluate fully while sedated.  Due to this fact unclear exactly what the patient's new baseline will be but suspect there will be some change from his previous baseline.  Will need to re-evaluate once extubated and sedation discontinued. Echocardiogram shows no cardiac  source of emboli but had some limited visualization with an EF of 55-60%.    Stroke Risk Factors - none  Plan: 1. HgbA1c, fasting lipid panel 2. PT consult, OT consult, Speech consult 3. Rectal ASA 349m daily 4. TEE 5. Carotid dopplers 6. Telemetry monitoring 7. Frequent neuro checks 8. EEG 9. Continued  BP support  Case discussed with Dr Tilman Neat, MD Neurology [phone]  01/24/2018, 9:36 AM

## 2018-01-24 NOTE — Progress Notes (Signed)
eeg completed ° °

## 2018-01-24 NOTE — Progress Notes (Signed)
Writer gave patient's belongings to patient's RN.

## 2018-01-24 NOTE — Progress Notes (Signed)
CRITICAL CARE NOTE  CC  follow up respiratory failure  SUBJECTIVE Patient remains critically ill Prognosis is guarded    MAJOR EVENTS/TEST RESULTS: 08/24 Placed in Behavioral unit at [facility] ED, awaiting placement 09/6fund unresponsive, intubated for airway protection, admitted to ICU.  09/29 passed SBT but remained minimally responsive off of sedative infusions 09/28CT Head:No acute findings 09/29 BLE venous US:No DVT 09/29Echo:LVEF 55-60%, PASP 30-35 mmHg 10/1 MRI shows ischemic infarcts    BP (!) 94/53   Pulse 62   Temp 97.6 F (36.4 C) (Axillary)   Resp 15   Ht 5' 1"  (1.549 m)   Wt 68 kg   SpO2 99%   BMI 28.33 kg/m    REVIEW OF SYSTEMS  PATIENT IS UNABLE TO PROVIDE COMPLETE REVIEW OF SYSTEMS DUE TO SEVERE CRITICAL ILLNESS   PHYSICAL EXAMINATION:  GENERAL:critically ill appearing, +resp distress HEAD: Normocephalic, atraumatic.  EYES: Pupils equal, round, reactive to light.  No scleral icterus.  MOUTH: Moist mucosal membrane. NECK: Supple. No thyromegaly. No nodules. No JVD.  PULMONARY: +rhonchi, +wheezing CARDIOVASCULAR: S1 and S2. Regular rate and rhythm. No murmurs, rubs, or gallops.  GASTROINTESTINAL: Soft, nontender, -distended. No masses. Positive bowel sounds. No hepatosplenomegaly.  MUSCULOSKELETAL: No swelling, clubbing, or edema.  NEUROLOGIC: obtunded, GCS<8 SKIN:intact,warm,dry      Indwelling Urinary Catheter continued, requirement due to   Reason to continue Indwelling Urinary Catheter for strict Intake/Output monitoring for hemodynamic instability         Ventilator continued, requirement due to, resp failure    Ventilator Sedation RASS 0 to -2     ASSESSMENT AND PLAN SYNOPSIS 37yo white male with acute unresponsiveness secondary to polypharmacy  intubated for airway protection  After further discussion with sister, it seems that patient had progressive encephalopathy with abd pain and lethargy associated with Septic  shock now complicated by acute Ischemic CVA's  Severe Hypoxic and Hypercapnic Respiratory Failure -continue Full MV support -continue Bronchodilator Therapy -Wean Fio2 and PEEP as tolerated -will perform SAT/SBT when respiratory parameters are met    NEUROLOGY - intubated and sedated - minimal sedation to achieve a RASS goal: -1 Wake up assessment pending +CVA Obtain neurology consultation   Septic shock -use vasopressors to keep MAP>65 -follow ABG and LA -follow up cultures -emperic ABX -consider stress dose steroids -aggressive IV fluid resuscitation  CARDIAC ICU monitoring  ID -continue IV abx as prescibed -follow up cultures  GI/Nutrition GI PROPHYLAXIS as indicated DIET-->TF's as tolerated Constipation protocol as indicated  ENDO - ICU hypoglycemic\Hyperglycemia protocol -check FSBS per protocol   ELECTROLYTES -follow labs as needed -replace as needed -pharmacy consultation and following   DVT/GI PRX ordered TRANSFUSIONS AS NEEDED MONITOR FSBS ASSESS the need for LABS as needed   Critical Care Time devoted to patient care services described in this note is 45 minutes.   Overall, patient is critically ill, prognosis is guarded.  Patient with Multiorgan failure and at high risk for cardiac arrest and death.   Prognosis is poor Will update sister when available  KCorrin Parker M.D.  LVelora HecklerPulmonary & Critical Care Medicine  Medical Director IJunction CityDirector AMason District HospitalCardio-Pulmonary Department

## 2018-01-24 NOTE — Progress Notes (Signed)
Pt getting an EEG so Vent check was postponed.

## 2018-01-24 NOTE — Progress Notes (Signed)
Patient transported to MRI without incident. Patient returned to ICU room and placed back on PS 5, 5PEEP, 24%. Patient resting comfortably.

## 2018-01-24 NOTE — Progress Notes (Signed)
Pharmacy ICU Monitoring Consult:  Pharmacy consulted to assist in monitoring and replacing electrolytes and constipation in this 37 y.o. male admitted on 12/17/2017 and admitted to the ICU on 9/28 with acute respiratory failure.   Labs:  Sodium (mmol/L)  Date Value  01/24/2018 143   Potassium (mmol/L)  Date Value  01/24/2018 4.1   Magnesium (mg/dL)  Date Value  16/01/9603 1.7   Phosphorus (mg/dL)  Date Value  54/12/8117 2.9   Calcium (mg/dL)  Date Value  14/78/2956 8.3 (L)   Albumin (g/dL)  Date Value  21/30/8657 3.2 (L)    Assessment/Plan: 1. Electrolytes: Will order magnesium 2g IV x 1. No further replacement warranted at this time.  Will replace to goal magnesium ~2 and goal potassium ~ 4 while critically ill. Will recheck electrolytes with am labs.   2. Constipation: last documented bowel movement was 9/30. Tube feeds will be held after midnight for TEE. Will continue senna/docusate 2 tabs VT BID.   Pharmacy will continue to monitor and adjust per consult.   Valko,Keenen L 01/24/2018 2:06 PM

## 2018-01-24 NOTE — Procedures (Signed)
ELECTROENCEPHALOGRAM REPORT   Patient: Derrick Marshall       Room #: IC20A-AA EEG No. ID: [id number] Age: 37 y.o.        Sex: male Referring Physician: Kasa Report Date:  01/24/2018        Interpreting Physician: Thana Farr  History: Khaleef Ruby is an 37 y.o. malewith a history of Down's Syndrome found down.  Medications:  Unasyn, Precedex, Cogentin, Valium, Pepcid, MVI, B12, Senokot, Levophed  Conditions of Recording:  This is a 21 channel routine scalp EEG performed with bipolar and monopolar montages arranged in accordance to the international 10/20 system of electrode placement. One channel was dedicated to EKG recording.  The patient is in the intubated and sedated state.  Description:  The background activity is discontinuous.  It consists of attneuated polymorphic delta activity alternating with periods of higher voltage mixture of theta and alpha activity that at times is fairly well organized.  The attenuated periods last from 3-5 seconds.  The higher voltage periods are shorter, lasting up to one second.  This activity is generalized and synchronous between the hemispheres.   No epileptiform activity is noted. Hyperventilation and intermittent photic stimulation were not performed.  IMPRESSION: This is an abnormal EEG due to a discontinuous slow background.  No epileptiform activity is noted.  This finding is consistent with the patient's current medications.      Thana Farr, MD Neurology [phone] 01/24/2018, 3:25 PM

## 2018-01-24 NOTE — Care Management Note (Signed)
Case Management Note  Patient Details  Name: Derrick Marshall MRN: [id number] Date of Birth: 10-15-1980  Subjective/Objective:                 Patient is a resident of [name].  Presented  to the ED 8/24 for aggressive behavior.Patient remained in the behavioral unit awaiting placement for higher level of care for this patient.  He was found unresponsive in his room in the ED 9/28 . Was intubated for airway protection.  Brain MRI positive for ischemic infarcts. Neuro consult pending   Action/Plan:    Expected Discharge Date:                  Expected Discharge Plan:     In-House Referral:     Discharge planning Services     Post Acute Care Choice:    Choice offered to:     DME Arranged:    DME Agency:     HH Arranged:    HH Agency:     Status of Service:     If discussed at [employer], dates discussed:    Additional Comments:  Eber Hong, RN 01/24/2018, 10:43 AM

## 2018-01-24 NOTE — Progress Notes (Signed)
Pharmacy Antibiotic Note  Derrick Marshall is a 37 y.o. male admitted on 12/17/2017. Patient transferred to the ICU 9/28 after an episode of unresponsiveness requiring intubation for airway protection. Concern for possible aspiration PNA. Pharmacy has been consulted for Unasyn dosing.  Plan: Per ICU rounds, will continue Unasyn 3g IV Q6hr for a planned total duration of 7 days.   Height: 5\' 1"  (154.9 cm) Weight: 149 lb 14.6 oz (68 kg) IBW/kg (Calculated) : 52.3  Temp (24hrs), Avg:97.7 F (36.5 C), Min:95.5 F (35.3 C), Max:100.2 F (37.9 C)  Recent Labs  Lab 01/20/18 2140 01/21/18 1645 01/21/18 1646 01/21/18 1832 01/21/18 2153 01/22/18 0010 01/22/18 0333 01/23/18 0415 01/24/18 0505  WBC 5.5 9.1  --   --  8.8  --  7.0 6.1  --   CREATININE 0.83 1.64*  --   --  1.38*  --  1.19 0.84 0.65  LATICACIDVEN  --   --  3.0* 2.4* 3.7* 3.5*  --   --   --     Estimated Creatinine Clearance: 103.8 mL/min (by C-G formula based on SCr of 0.65 mg/dL).    Allergies  Allergen Reactions  . Celexa [Citalopram Hydrobromide] Other (See Comments)    Aggressive   . Zyprexa [Olanzapine] Other (See Comments)    Aggressive behavior     Antimicrobials this admission: Vancomycin 9/28 >> 9/29 Cefepime 9/28 >> 9/29 Unasyn 9/30 >>  Dose adjustments this admission: N/A  Microbiology results: 9/28 BCx: No growth x 3 days  9/28 MRSA PCR: negative  Thank you for allowing pharmacy to be a part of this patient's care.  Solivan,Bodie L 01/24/2018 2:14 PM

## 2018-01-24 NOTE — Consult Note (Signed)
Psychiatry: Follow-up for this 37 year old man with trisomy 21 and severe behavior problems.  Patient decompensated and required transfer to the intensive care unit over the weekend.  Patient is on a ventilator at this point.  Psychiatric medicine has always been for this patient primarily targeted at just controlling acute behavior problems.  No need for any specific treatment at this point I will be glad to follow up as needed.

## 2018-01-24 NOTE — Progress Notes (Signed)
Performed wakeup assessment- Patient was able to open eyes to voice and move all extremities- though not to command.  Patient starting breathing in the 40's- Dr. Belia Heman ordered to have patient placed back on precedex drip to keep patient comfortable- he ordered to have patient on precedex until EEG and TEE performed.  He stated he would talk to sister Derrick Marshall about plan of extubation after procedures.

## 2018-01-24 NOTE — Progress Notes (Signed)
[employer] - [place] at [facility]   PATIENT NAME: Derrick Marshall    MR#:  [id number]  DATE OF BIRTH:  01/03/1981  SUBJECTIVE:  Patient remains intubated and sedated REVIEW OF SYSTEMS:  Review of Systems  Unable to perform ROS: Intubated    DRUG ALLERGIES:   Allergies  Allergen Reactions  . Celexa [Citalopram Hydrobromide] Other (See Comments)    Aggressive   . Zyprexa [Olanzapine] Other (See Comments)    Aggressive behavior    VITALS:  Blood pressure (!) 102/58, pulse 86, temperature 98.2 F (36.8 C), temperature source Oral, resp. rate 15, height 5\' 1"  (1.549 m), weight 68 kg, SpO2 99 %. PHYSICAL EXAMINATION:  Physical Exam  HENT:  Head: Normocephalic.  Mouth/Throat: Oropharynx is clear and moist.  ETT in place  Eyes: No scleral icterus.  Neck: Neck supple. No JVD present. No tracheal deviation present.  Cardiovascular: Normal rate, regular rhythm and normal heart sounds. Exam reveals no gallop.  No murmur heard. Pulmonary/Chest: Effort normal and breath sounds normal. No respiratory distress. He has no wheezes. He has no rales.  Abdominal: Soft. Bowel sounds are normal. He exhibits no distension. There is no tenderness. There is no rebound.  Musculoskeletal: He exhibits no edema or tenderness.  Neurological:  Unable to exam.  Skin: No rash noted. No erythema.   LABORATORY PANEL:  Male CBC Recent Labs  Lab 01/23/18 0415  WBC 6.1  HGB 11.8*  HCT 33.7*  PLT 141*   ------------------------------------------------------------------------------------------------------------------ Chemistries  Recent Labs  Lab 01/22/18 0333  01/24/18 0505  NA 140   < > 143  K 4.0   < > 4.1  CL 107   < > 111  CO2 26   < > 24  GLUCOSE 113*   < > 103*  BUN 15   < > 10  CREATININE 1.19   < > 0.65  CALCIUM 8.3*   < > 8.3*  MG 2.0  --  1.7  AST 63*  --   --   ALT 35  --   --   ALKPHOS 93  --   --   BILITOT 0.9  --   --    < > = values in this  interval not displayed.   RADIOLOGY:  Mr Brain Wo Contrast  Result Date: 01/23/2018 CLINICAL DATA:  Initial evaluation for altered mental status, unresponsiveness. History of acute respiratory failure with hypoxia and hypercapnia. EXAM: MRI HEAD WITHOUT CONTRAST TECHNIQUE: Multiplanar, multiecho pulse sequences of the brain and surrounding structures were obtained without intravenous contrast. COMPARISON:  Prior CT from 01/13/2018. FINDINGS: Brain: Cerebral volume within normal limits for age. Few scattered subcentimeter T2/FLAIR hyperintensities noted within the periventricular and deep white matter both cerebral hemispheres, nonspecific. No significant cerebral white matter changes for age. There is symmetric abnormal restricted diffusion with associated T2/FLAIR signal abnormality involving the globus pallidum bilaterally (series 2, image 31). Given provided history, findings most likely related to hypoxic injury. No associated hemorrhage identified. Additional punctate focus of restricted diffusion seen involving the cortical gray matter of the anterior left frontal lobe (series 4, image 32), consistent with a tiny acute ischemic cortical infarct. Additional small subcentimeter infarct noted within the left cerebellar hemisphere (series 4, image 15). No associated hemorrhage. No other evidence for acute or subacute ischemia. No mass lesion, midline shift or mass effect. No hydrocephalus. No extra-axial fluid collection. Pituitary gland within normal limits. Vascular: Major intracranial vascular flow voids maintained. Hypoplastic left vertebral artery not  well seen. Skull and upper cervical spine: Craniocervical junction within normal limits. Mild multilevel cervical spondylolysis within the upper cervical spine without significant stenosis. No focal marrow replacing lesion. Scalp soft tissues unremarkable. Sinuses/Orbits: Globes and orbital soft tissues demonstrate no acute finding. Paranasal sinuses are  clear. Fluid seen within the posterior nasopharynx. Patient is intubated. No appreciable mastoid effusion. Other: None. IMPRESSION: 1. Bilateral symmetric diffusion abnormality involving the globus pallidi bilaterally. Given history of acute respiratory failure with hypoxia, findings favored to be secondary to acute hypoxic brain injury. Carbon monoxide poisoning or other toxic metabolic derangement could also be considered. 2. Few punctate ischemic infarcts involving the anterior left frontal cortical gray matter and left cerebellum as above. 3. Otherwise normal brain MRI. Electronically Signed   By: Rise Mu M.D.   On: 01/23/2018 22:43   ASSESSMENT AND PLAN:   Acute respiratory failure with hypoxia and hypercapnia- likely secondary to aspiration pneumonia vs HCAP. Procalcitonin elevated to 0.58. - vent management per CCM - initially on vanc/cefepime, now on unasyn  - f/u cultures  Septic shock- likely due to pneumonia. - on levophed - continue IVFs - may need stress dose steroids  Acute CVA- MRI with multiple punctate acute infarcts may be due to hypotension vs embolic etiology - neurology following - carotid dopplers, EEG pending - cardiology consulted for TEE- will occur 10/2 at 8AM - a1c, lipid panel - aspirin 300mg  daily PR  Lactic acidosis- persistent, likely due to shock - IVFs  Elevated troponin- likely due to demand ischemia. ECHO with EF 55-60%.  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 Management plans discussed with the patient's sister and they are in agreement.  CODE STATUS: Full Code  TOTAL TIME TAKING CARE OF THIS PATIENT: 35 minutes.   More than 50% of the time was spent in counseling/coordination of care: YES  D/C UNCLEAR, DEPENDING ON CLINICAL CONDITION.   Jinny Blossom Izabel Chim M.D on 01/24/2018 at 7:38  PM  Between 7am to 6pm - Pager - [phone]  After 6pm go to [email or web address] - Therapist, nutritional Hospitalists

## 2018-01-25 ENCOUNTER — Inpatient Hospital Stay (HOSPITAL_COMMUNITY)
Admit: 2018-01-25 | Discharge: 2018-01-25 | Disposition: A | Discharge status: Home / Self Care | Payer: Medicare Other | Attending: Internal Medicine | Admitting: Internal Medicine

## 2018-01-25 ENCOUNTER — Inpatient Hospital Stay (HOSPITAL_COMMUNITY)
Admit: 2018-01-25 | Discharge: 2018-01-25 | Disposition: A | Discharge status: Home / Self Care | Payer: Medicare Other | Attending: Nurse Practitioner | Admitting: Nurse Practitioner

## 2018-01-25 DIAGNOSIS — J9600 Acute respiratory failure, unspecified whether with hypoxia or hypercapnia: Secondary | ICD-10-CM

## 2018-01-25 DIAGNOSIS — I639 Cerebral infarction, unspecified: Secondary | ICD-10-CM

## 2018-01-25 LAB — BASIC METABOLIC PANEL
ANION GAP: 7 (ref 5–15)
BUN: 17 mg/dL (ref 6–20)
CALCIUM: 7.7 mg/dL — AB (ref 8.9–10.3)
CO2: 25 mmol/L (ref 22–32)
Chloride: 107 mmol/L (ref 98–111)
Creatinine, Ser: 0.54 mg/dL — ABNORMAL LOW (ref 0.61–1.24)
GFR calc Af Amer: >60 mL/min (ref >60)
GLUCOSE: 108 mg/dL — AB (ref 70–99)
POTASSIUM: 3.8 mmol/L (ref 3.5–5.1)
Sodium: 139 mmol/L (ref 135–145)

## 2018-01-25 LAB — BLOOD GAS, ARTERIAL
Acid-Base Excess: 4.9 mmol/L — ABNORMAL HIGH (ref 0.0–2.0)
Bicarbonate: 29.2 mmol/L — ABNORMAL HIGH (ref 20.0–28.0)
FIO2: 0.24
LHR: 16 {breaths}/min
O2 Saturation: 98.9 %
PEEP: 5 cmH2O
Patient temperature: 37
VT: 380 mL
pCO2 arterial: 41 mmHg (ref 32.0–48.0)
pH, Arterial: 7.46 — ABNORMAL HIGH (ref 7.350–7.450)
pO2, Arterial: 120 mmHg — ABNORMAL HIGH (ref 83.0–108.0)

## 2018-01-25 LAB — GLUCOSE, CAPILLARY
GLUCOSE-CAPILLARY: 139 mg/dL — AB (ref 70–99)
GLUCOSE-CAPILLARY: 190 mg/dL — AB (ref 70–99)
Glucose-Capillary: 101 mg/dL — ABNORMAL HIGH (ref 70–99)
Glucose-Capillary: 172 mg/dL — ABNORMAL HIGH (ref 70–99)

## 2018-01-25 LAB — CBC
HCT: 24.5 % — ABNORMAL LOW (ref 40.0–52.0)
HCT: 28.2 % — ABNORMAL LOW (ref 40.0–52.0)
Hemoglobin: 8.8 g/dL — ABNORMAL LOW (ref 13.0–18.0)
Hemoglobin: 10 g/dL — ABNORMAL LOW (ref 13.0–18.0)
MCH: 39.4 pg — ABNORMAL HIGH (ref 26.0–34.0)
MCH: 38.6 pg — ABNORMAL HIGH (ref 26.0–34.0)
MCHC: 35.8 g/dL (ref 32.0–36.0)
MCHC: 35.4 g/dL (ref 32.0–36.0)
MCV: 110.1 fL — ABNORMAL HIGH (ref 80.0–100.0)
MCV: 109 fL — ABNORMAL HIGH (ref 80.0–100.0)
PLATELETS: 110 10*3/uL — AB (ref 150–440)
Platelets: 115 10*3/uL — ABNORMAL LOW (ref 150–440)
RBC: 2.22 MIL/uL — ABNORMAL LOW (ref 4.40–5.90)
RBC: 2.59 MIL/uL — ABNORMAL LOW (ref 4.40–5.90)
RDW: 16.5 % — AB (ref 11.5–14.5)
RDW: 16.2 % — AB (ref 11.5–14.5)
WBC: 5.5 10*3/uL (ref 3.8–10.6)
WBC: 5.2 10*3/uL (ref 3.8–10.6)

## 2018-01-25 LAB — MAGNESIUM: Magnesium: 2.1 mg/dL (ref 1.7–2.4)

## 2018-01-25 LAB — PROTIME-INR
INR: 1.17
Prothrombin Time: 14.8 seconds (ref 11.4–15.2)

## 2018-01-25 MED ORDER — MIDAZOLAM HCL 2 MG/2ML IJ SOLN
INTRAMUSCULAR | Status: AC
  Administered 2018-01-25: 2 mg via INTRAVENOUS
  Filled 2018-01-25: qty 2

## 2018-01-25 MED ORDER — MIDAZOLAM HCL 2 MG/2ML IJ SOLN
2.0000 mg | Freq: Once | INTRAMUSCULAR | Status: AC
  Administered 2018-01-25: 2 mg via INTRAVENOUS

## 2018-01-25 MED ORDER — FUROSEMIDE 10 MG/ML IJ SOLN
60.0000 mg | Freq: Once | INTRAMUSCULAR | Status: AC
  Administered 2018-01-25: 60 mg via INTRAVENOUS
  Filled 2018-01-25: qty 6

## 2018-01-25 MED ORDER — FENTANYL CITRATE (PF) 100 MCG/2ML IJ SOLN
25.0000 ug | Freq: Once | INTRAMUSCULAR | Status: AC
  Administered 2018-01-25: 25 ug via INTRAVENOUS

## 2018-01-25 MED ORDER — METHYLPREDNISOLONE SODIUM SUCC 40 MG IJ SOLR
20.0000 mg | Freq: Four times a day (QID) | INTRAMUSCULAR | Status: AC
  Administered 2018-01-25 – 2018-01-26 (×5): 20 mg via INTRAVENOUS
  Filled 2018-01-25 (×5): qty 1

## 2018-01-25 MED ORDER — FENTANYL CITRATE (PF) 100 MCG/2ML IJ SOLN
INTRAMUSCULAR | Status: AC
  Administered 2018-01-25: 25 ug via INTRAVENOUS
  Filled 2018-01-25: qty 2

## 2018-01-25 MED ORDER — POTASSIUM CHLORIDE 20 MEQ PO PACK
40.0000 meq | PACK | Freq: Once | ORAL | Status: AC
  Administered 2018-01-25: 40 meq via ORAL
  Filled 2018-01-25: qty 2

## 2018-01-25 MED ORDER — FENTANYL CITRATE (PF) 100 MCG/2ML IJ SOLN
100.0000 ug | INTRAMUSCULAR | Status: DC | PRN
  Administered 2018-01-25: 100 ug via INTRAVENOUS

## 2018-01-25 MED ORDER — FENTANYL CITRATE (PF) 100 MCG/2ML IJ SOLN
100.0000 ug | INTRAMUSCULAR | Status: DC | PRN
  Filled 2018-01-25: qty 2

## 2018-01-25 NOTE — Progress Notes (Signed)
CRITICAL CARE NOTE  CC  follow up respiratory failure  SUBJECTIVE Patient remains critically ill Prognosis is guarded Remains on  Vent  attempted TEE at bedside Unable to pass scope No cuff leak noted   MAJOR EVENTS/TEST RESULTS: 08/24 Placed in Behavioral unit at [facility] ED, awaiting placement 09/37fund unresponsive, intubated for airway protection, admitted to ICU.  09/29 passed SBT but remained minimally responsive off of sedative infusions 09/28CT Head:No acute findings 09/29 BLE venous US:No DVT 09/29Echo:LVEF 55-60%, PASP 30-35 mmHg 10/1 MRI shows ischemic infarcts   BP (!) 95/58   Pulse 68   Temp 98.2 F (36.8 C) (Oral)   Resp 16   Ht 5' 1" (1.549 m)   Wt 68.9 kg   SpO2 98%   BMI 28.70 kg/m    REVIEW OF SYSTEMS  PATIENT IS UNABLE TO PROVIDE COMPLETE REVIEW OF SYSTEMS DUE TO SEVERE CRITICAL ILLNESS   PHYSICAL EXAMINATION:  GENERAL:critically ill appearing, +resp distress HEAD: Normocephalic, atraumatic.  EYES: Pupils equal, round, reactive to light.  No scleral icterus.  MOUTH: Moist mucosal membrane. NECK: Supple. No thyromegaly. No nodules. No JVD.  PULMONARY: +rhonchi, +wheezing CARDIOVASCULAR: S1 and S2. Regular rate and rhythm. No murmurs, rubs, or gallops.  GASTROINTESTINAL: Soft, nontender, -distended. No masses. Positive bowel sounds. No hepatosplenomegaly.  MUSCULOSKELETAL: No swelling, clubbing, or edema.  NEUROLOGIC: obtunded, GCS<8 SKIN:intact,warm,dry      Indwelling Urinary Catheter continued, requirement due to   Reason to continue Indwelling Urinary Catheter for strict Intake/Output monitoring for hemodynamic instability         Ventilator continued, requirement due to, resp failure    Ventilator Sedation RASS 0 to -2     ASSESSMENT AND PLAN SYNOPSIS 37yo WM with acute ence[phlopathy and acute and severe resp failure complicated by acute CVA's  Severe Hypoxic and Hypercapnic Respiratory Failure -continue Full MV  support -continue Bronchodilator Therapy -Wean Fio2 and PEEP as tolerated -will perform SAT/SBT when respiratory parameters are met when family arrives   Renal Failure-most likely due to ATN -follow chem 7 -follow UO -continue Foley Catheter-assess need daily   NEUROLOGY - intubated and sedated - minimal sedation to achieve a RASS goal: -1 Wake up assessment pending +CVA  Follow up Neuro recs EEG pending    Septic shock -use vasopressors to keep MAP>65 -follow ABG and LA as needed -follow up cultures -emperic ABX -consider stress dose steroids -aggressive IV fluid resuscitation  CARDIAC ICU monitoring Unable to obtain TEE Will obtain Limited ECHO with Bubble study  ID -continue IV abx as prescibed -follow up cultures  GI/Nutrition GI PROPHYLAXIS as indicated DIET-->TF's as tolerated Constipation protocol as indicated  ENDO - ICU hypoglycemic\Hyperglycemia protocol -check FSBS per protocol   ELECTROLYTES -follow labs as needed -replace as needed -pharmacy consultation and following   DVT/GI PRX ordered TRANSFUSIONS AS NEEDED MONITOR FSBS ASSESS the need for LABS as needed   Critical Care Time devoted to patient care services described in this note is 43 minutes.   Overall, patient is critically ill, prognosis is guarded.  Patient with Multiorgan failure and at high risk for cardiac arrest and death.    KCorrin Parker M.D.  LVelora HecklerPulmonary & Critical Care Medicine  Medical Director ISedanDirector ADoctors Park Surgery IncCardio-Pulmonary Department

## 2018-01-25 NOTE — Progress Notes (Addendum)
[employer] - [place] at [facility]   PATIENT NAME: Derrick Marshall    MR#:  [id number]  DATE OF BIRTH:  March 26, 1981  SUBJECTIVE:  Patient remains on pressure support, unable to do TEE.  Patient remains critically ill. BecauseREVIEW OF SYSTEMS:  Review of Systems  Unable to perform ROS: Intubated    DRUG ALLERGIES:   Allergies  Allergen Reactions  . Celexa [Citalopram Hydrobromide] Other (See Comments)    Aggressive   . Zyprexa [Olanzapine] Other (See Comments)    Aggressive behavior    VITALS:  Blood pressure (!) 95/51, pulse 82, temperature 98.2 F (36.8 C), temperature source Oral, resp. rate 15, height 5\' 1"  (1.549 m), weight 68.9 kg, SpO2 98 %. PHYSICAL EXAMINATION:  Physical Exam  HENT:  Head: Normocephalic.  Mouth/Throat: Oropharynx is clear and moist.  ETT in place  Eyes: No scleral icterus.  Neck: Neck supple. No JVD present. No tracheal deviation present.  Cardiovascular: Normal rate, regular rhythm and normal heart sounds. Exam reveals no gallop.  No murmur heard. Pulmonary/Chest: Effort normal and breath sounds normal. No respiratory distress. He has no wheezes. He has no rales.  Abdominal: Soft. Bowel sounds are normal. He exhibits no distension. There is no tenderness. There is no rebound.  Musculoskeletal: He exhibits no edema or tenderness.  Neurological:  Unable to exam.  Skin: No rash noted. No erythema.   LABORATORY PANEL:  Male CBC Recent Labs  Lab 01/25/18 1144  WBC 5.2  HGB 10.0*  HCT 28.2*  PLT 110*   ------------------------------------------------------------------------------------------------------------------ Chemistries  Recent Labs  Lab 01/22/18 0333  01/25/18 0512  NA 140   < > 139  K 4.0   < > 3.8  CL 107   < > 107  CO2 26   < > 25  GLUCOSE 113*   < > 108*  BUN 15   < > 17  CREATININE 1.19   < > 0.54*  CALCIUM 8.3*   < > 7.7*  MG 2.0   < > 2.1  AST 63*  --   --   ALT 35  --   --   ALKPHOS 93   --   --   BILITOT 0.9  --   --    < > = values in this interval not displayed.   RADIOLOGY:  No results found. ASSESSMENT AND PLAN:   Acute respiratory failure with hypoxia and hypercapnia- likely secondary to aspiration pneumonia vs HCAP. Procalcitonin elevated to 0.58. - vent management per CCM - initially on vanc/cefepime, now on unasyn  - f/u cultures  Septic shock- likely due to pneumonia. - on levophed - continue IVFs - may need stress dose steroids  Acute CVA- MRI with multiple punctate acute infarcts may be due to hypotension vs embolic etiology - neurology following - carotid dopplers, EEG pending Unable to do TEE today. - a1c, lipid panel - aspirin 300mg  daily PR  Lactic acidosis- persistent, likely due to shock - IVFs  Elevated troponin- likely due to demand ischemia. ECHO with EF 55-60%.  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 Management plans discussed with the patient's sister and they are in agreement.  CODE STATUS: Full Code  TOTAL TIME TAKING CARE OF THIS PATIENT: 35 minutes.   More than 50% of the time was spent in counseling/coordination of care: YES  D/C UNCLEAR, DEPENDING ON  CLINICAL CONDITION.   Katha Hamming M.D on 01/25/2018 at 7:02 PM  Between 7am to 6pm - Pager - [phone]  After 6pm go to [email or web address] - Therapist, nutritional Hospitalists

## 2018-01-25 NOTE — Progress Notes (Signed)
*  PRELIMINARY RESULTS* Echocardiogram 2D Echocardiogram has been performed.  Cristela Blue 01/25/2018, 12:42 PM

## 2018-01-25 NOTE — CV Procedure (Signed)
    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  Performed  Medications:  During this procedure the patient is administered a total of Versed 2 mg and Fentanyl 25 mcg  to achieve and maintain moderate conscious sedation.  The patient's heart rate, blood pressure, and oxygen saturation are monitored continuously during the procedure. The period of conscious sedation is 15 minutes, of which I was present face-to-face 100% of this time.  Multiple attempts were made to intubate the esophagus without success.  Endotrachial tube cuff was deflated to facilitate passage of the TEE probe, the I still encountered resistance in the hypopharynx precluding safe passage of the probe.  Cuff leak was notably absent even after deflation of the ETT cuff, suggesting edema or abnormal anatomy in the hypopharynx/larynx.  Given these factors, the procedure was aborted.  I recommend a limited transthoracic echocardiogram with bubble study to evaluate for right-to-left shunt.  If there is still concern for a cardioembolic source and TEE must be performed, ENT evaluation of the patient and assistance with passage of the scope will be needed.  These findings and recommendations were discussed with Dr. Belia Heman.  Complications: Inability to pass scope (see above). Patient did tolerate procedure well.  [name], MD 01/25/2018, 8:35 AM

## 2018-01-25 NOTE — Progress Notes (Signed)
Patient continues to be intubated and sedated. Currently on pressure support 5/5 at 24%. Patient is able to follow very simple commands when directed to move/wiggle this right/left foot/toes and lift this right/left arm. Patient has been afebrile, VSS. Pt is also more tolerable to oral care this shift. Tube feed stopped per order for scheduled TEE today. Foley continues to drain clear yellow urine this shift. Pt continues to be NSR/BBB on the monitor.

## 2018-01-25 NOTE — Progress Notes (Signed)
Subjective: Patient remains intubated and sedated. He is able to follow simple commands when prompted.  Objective: Current vital signs: BP (!) 97/57   Pulse (!) 52   Temp 98.2 F (36.8 C) (Oral)   Resp 16   Ht 5' 1"  (1.549 m)   Wt 68.9 kg   SpO2 99%   BMI 28.70 kg/m  Vital signs in last 24 hours: Temp:  [97.7 F (36.5 C)-99.2 F (37.3 C)] 98.2 F (36.8 C) (10/02 0748) Pulse Rate:  [52-87] 52 (10/02 1100) Resp:  [14-22] 16 (10/02 1100) BP: (82-113)/(39-65) 97/57 (10/02 1100) SpO2:  [97 %-100 %] 99 % (10/02 1100) FiO2 (%):  [24 %] 24 % (10/02 0851) Weight:  [68.9 kg] 68.9 kg (10/02 0315)  Intake/Output from previous day: 10/01 0701 - 10/02 0700 In: 3177 [I.V.:1339.3; LP/FX:9024; IV Piggyback:394.7] Out: 875 [Urine:875] Intake/Output this shift: No intake/output data recorded. Nutritional status:  Diet Order            Diet NPO time specified  Diet effective midnight             Neurologic Exam:   Mental Status: Intubated and sedated. Opens eyes spontaneously to verbal stimuli. Able to  follow simple commands such as wiggle toes and closing eyes. Cranial Nerves: II: Discs flat bilaterally; blinks to visual field threats, pupils equal, round, reactive to light and accommodation III,IV, VI: ptosis not present, dysconjugate extraocular movements V,VII: Intact corneals bilaterally VIII: unable to test IX,X: gag reflex present XI: Unable to assess XII: Unable to assess Motor: Localizing left upper extremity and moves right upper extremity without crossing midline with noxious stimuli Withdraws both lower extremities to painful stimuli Sensory: Responds to noxious stimuli throughout Deep Tendon Reflexes: 2+ in the upper extremities and bilateral knee jerks.  2+ left ankle jerk.  4+ right ankle jerk with sustained clonus.   Plantars: Equivocal bilaterally Cerebellar: Unable to assess  Lab Results: Basic Metabolic Panel: Recent Labs  Lab 01/21/18 1645  01/21/18 2153 01/22/18 0333 01/23/18 0415 01/24/18 0505 01/25/18 0512  NA 138  --  140 138 143 139  K 4.7  --  4.0 3.9 4.1 3.8  CL 100  --  107 107 111 107  CO2 28  --  26 28 24 25   GLUCOSE 155*  --  113* 141* 103* 108*  BUN 16  --  15 8 10 17   CREATININE 1.64* 1.38* 1.19 0.84 0.65 0.54*  CALCIUM 8.8*  --  8.3* 8.6* 8.3* 7.7*  MG  --   --  2.0  --  1.7 2.1  PHOS  --   --  2.9  --  2.9  --     Liver Function Tests: Recent Labs  Lab 01/21/18 1645 01/22/18 0333  AST 66* 63*  ALT 38 35  ALKPHOS 100 93  BILITOT 0.5 0.9  PROT 6.8 6.5  ALBUMIN 3.4* 3.2*   No results for input(s): LIPASE, AMYLASE in the last 168 hours. No results for input(s): AMMONIA in the last 168 hours.  CBC: Recent Labs  Lab 01/20/18 2140 01/21/18 1645 01/21/18 2153 01/22/18 0333 01/23/18 0415 01/25/18 0512  WBC 5.5 9.1 8.8 7.0 6.1 5.5  NEUTROABS 4.4  --   --  5.1  --   --   HGB 13.6 13.2 13.2 12.8* 11.8* 8.8*  HCT 38.3* 36.6* 37.5* 35.9* 33.7* 24.5*  MCV 106.0* 108.6* 107.8* 107.4* 107.6* 110.1*  PLT 132* 146* 150 135* 141* 115*    Cardiac Enzymes: Recent Labs  Lab 01/21/18 1645 01/21/18 2153 01/22/18 0333 01/22/18 0941  TROPONINI 0.04* 0.40* 0.65* 0.44*    Lipid Panel: Recent Labs  Lab 01/21/18 2203  TRIG 70    CBG: Recent Labs  Lab 01/24/18 1237 01/24/18 1843 01/24/18 2354 01/25/18 0516 01/25/18 1132  GLUCAP 139* 91 115* 101* 139*    Microbiology: Results for orders placed or performed during the hospital encounter of 12/17/17  Blood culture (routine x 2)     Status: None (Preliminary result)   Collection Time: 01/21/18  6:25 PM  Result Value Ref Range Status   Specimen Description BLOOD Blood Culture adequate volume  Final   Special Requests   Final    BOTTLES DRAWN AEROBIC AND ANAEROBIC LEFT ANTECUBITAL   Culture   Final    NO GROWTH 4 DAYS Performed at [facility], [address], [place], [place] [address]    Report Status PENDING  Incomplete   Blood culture (routine x 2)     Status: None (Preliminary result)   Collection Time: 01/21/18  6:32 PM  Result Value Ref Range Status   Specimen Description BLOOD Blood Culture adequate volume  Final   Special Requests   Final    BOTTLES DRAWN AEROBIC AND ANAEROBIC RIGHT ANTECUBITAL   Culture   Final    NO GROWTH 4 DAYS Performed at [facility], [address] [place], [place] [address]    Report Status PENDING  Incomplete  MRSA PCR Screening     Status: None   Collection Time: 01/21/18  9:36 PM  Result Value Ref Range Status   MRSA by PCR NEGATIVE NEGATIVE Final    Comment:        The GeneXpert MRSA Assay (FDA approved for NASAL specimens only), is one component of a comprehensive MRSA colonization surveillance program. It is not intended to diagnose MRSA infection nor to guide or monitor treatment for MRSA infections. Performed at [facility], [address], [place],  [address]     Coagulation Studies: Recent Labs    01/25/18 0512  LABPROT 14.8  INR 1.17    Imaging: Mr Brain Wo Contrast  Result Date: 01/23/2018 CLINICAL DATA:  Initial evaluation for altered mental status, unresponsiveness. History of acute respiratory failure with hypoxia and hypercapnia. EXAM: MRI HEAD WITHOUT CONTRAST TECHNIQUE: Multiplanar, multiecho pulse sequences of the brain and surrounding structures were obtained without intravenous contrast. COMPARISON:  Prior CT from 01/13/2018. FINDINGS: Brain: Cerebral volume within normal limits for age. Few scattered subcentimeter T2/FLAIR hyperintensities noted within the periventricular and deep white matter both cerebral hemispheres, nonspecific. No significant cerebral white matter changes for age. There is symmetric abnormal restricted diffusion with associated T2/FLAIR signal abnormality involving the globus pallidum bilaterally (series 2, image 31). Given provided history, findings most likely related to hypoxic injury.  No associated hemorrhage identified. Additional punctate focus of restricted diffusion seen involving the cortical gray matter of the anterior left frontal lobe (series 4, image 32), consistent with a tiny acute ischemic cortical infarct. Additional small subcentimeter infarct noted within the left cerebellar hemisphere (series 4, image 15). No associated hemorrhage. No other evidence for acute or subacute ischemia. No mass lesion, midline shift or mass effect. No hydrocephalus. No extra-axial fluid collection. Pituitary gland within normal limits. Vascular: Major intracranial vascular flow voids maintained. Hypoplastic left vertebral artery not well seen. Skull and upper cervical spine: Craniocervical junction within normal limits. Mild multilevel cervical spondylolysis within the upper cervical spine without significant stenosis. No focal marrow  replacing lesion. Scalp soft tissues unremarkable. Sinuses/Orbits: Globes and orbital soft tissues demonstrate no acute finding. Paranasal sinuses are clear. Fluid seen within the posterior nasopharynx. Patient is intubated. No appreciable mastoid effusion. Other: None. IMPRESSION: 1. Bilateral symmetric diffusion abnormality involving the globus pallidi bilaterally. Given history of acute respiratory failure with hypoxia, findings favored to be secondary to acute hypoxic brain injury. Carbon monoxide poisoning or other toxic metabolic derangement could also be considered. 2. Few punctate ischemic infarcts involving the anterior left frontal cortical gray matter and left cerebellum as above. 3. Otherwise normal brain MRI. Electronically Signed   By: Jeannine Boga M.D.   On: 01/23/2018 22:43    Medications:  I have reviewed the patient's current medications. Prior to Admission:  Medications Prior to Admission  Medication Sig Dispense Refill Last Dose  . benztropine (COGENTIN) 1 MG tablet Take 1 mg by mouth 2 (two) times daily.   unknown at unknown  .  bisacodyl (DULCOLAX) 10 MG suppository Place 10 mg rectally as needed for moderate constipation. Insert 1 suppository per rectum if no bowel movements in 48 hours   unknown at unknown  . chlorhexidine (HIBICLENS) 4 % external liquid Apply 1 application topically once a week. Shower from head to toe weekly   unknown at unknown  . diazepam (VALIUM) 5 MG tablet Take 1 tablet (5 mg total) by mouth daily at 2 PM. 30 tablet 0 unknown at unknown  . docusate sodium (COLACE) 100 MG capsule Take 100 mg by mouth 2 (two) times daily.   unknown at unknown  . famotidine (PEPCID) 20 MG tablet Take 20 mg by mouth 2 (two) times daily before a meal.   unknown at unknown  . haloperidol (HALDOL) 1 MG tablet Take 3 mg by mouth 2 (two) times daily. In the morning and at 1630   unknown at unknown  . miconazole (MICOTIN) 2 % powder Apply topically daily. Apply powder to groin, abdomen and buttocks daily after shower   unknown at unknown  . Miconazole Nitrate (ALOE VESTA ANTIFUNGAL) 2 % OINT Apply 1 application topically 2 (two) times daily as needed (itching). Apply to groin area   unknown at unknown  . Multiple Vitamin (MULTIVITAMIN WITH MINERALS) TABS tablet Take 1 tablet by mouth daily.   unknown at unknown  . Skin Protectants, Misc. (EUCERIN) cream Apply 1 application topically daily.    unknown at unknown  . vitamin B-12 (CYANOCOBALAMIN) 500 MCG tablet Take 1,000 mcg by mouth 2 (two) times daily.   unknown at unknown  . Vitamin D, Ergocalciferol, (DRISDOL) 50000 units CAPS capsule Take 50,000 Units by mouth every 7 (seven) days.   unknown at unknown  . ammonium lactate (LAC-HYDRIN) 12 % lotion Apply 1 application topically daily. Apply lotion to legs and chest daily after shower   unknown at unknown   Scheduled: . benztropine  1 mg Oral BID  . chlorhexidine gluconate (MEDLINE KIT)  15 mL Mouth Rinse BID  . diazepam  2.5 mg Per Tube Daily  . enoxaparin (LOVENOX) injection  40 mg Subcutaneous Q24H  . famotidine  20 mg  Per Tube BID  . feeding supplement (PRO-STAT SUGAR FREE 64)  30 mL Per Tube Daily  . furosemide  60 mg Intravenous Once  . ketotifen  1 drop Both Eyes BID  . LORazepam  1 mg Intravenous Once  . mouth rinse  15 mL Mouth Rinse 10 times per day  . methylPREDNISolone (SOLU-MEDROL) injection  20 mg Intravenous Q6H  .  multivitamin  15 mL Per Tube Daily  . potassium chloride  40 mEq Oral Once  . senna-docusate  1 tablet Per Tube BID  . vitamin B-12  100 mcg Per Tube Daily    Patient seen and examined.  Clinical course and management discussed.  Necessary edits performed.  I agree with the above.  Assessment and plan of care developed and discussed below.     Assessment: Patient with improvement noted neurologically (able to follow simple commands). Remains intubated and sedated. TEE attempted but was unsuccessful due to inability to pass scope. EEG showed abnormality due to a discontinuous slow background likely due to current medications otherwise no epileptiform discharges noted.  Plan 1. PT consult, OT consult, Speech consult 2. Continue Rectal ASA 352m daily 3. Echocardiogram with bubble study pending 4. Carotid dopplers pending 5. Telemetry monitoring 6. Frequent neuro checks 7. Continued BP support  This patient was staffed with Dr. LMagda Paganini RDoy Mincewho personally evaluated patient, reviewed documentation and agreed with assessment and plan of care as above.  ERufina Falco DNP, FNP-BC Board certified Nurse Practitioner Neurology Department    LOS: 4 days   01/25/2018  11:51 AM  LAlexis Goodell MD Neurology [phone] 01/25/2018  12:19 PM

## 2018-01-25 NOTE — Consult Note (Signed)
Psychiatry: Patient remains intubated.  I will follow-up as needed.

## 2018-01-25 NOTE — Progress Notes (Signed)
Spoke with Dr. Okey Dupre cardiologist this morning, was notified that they would be up to do TEE at 0800 and to have patient sedated. He also verified that tube feeds were held per order since midnight. Will update day RN at shift change.

## 2018-01-25 NOTE — Progress Notes (Signed)
Pharmacy ICU Monitoring Consult:  Pharmacy consulted to assist in monitoring and replacing electrolytes and constipation in this 37 y.o. male admitted on 12/17/2017 and admitted to the ICU on 9/28 with acute respiratory failure.   Labs:  Sodium (mmol/L)  Date Value  01/25/2018 139   Potassium (mmol/L)  Date Value  01/25/2018 3.8   Magnesium (mg/dL)  Date Value  16/01/9603 2.1   Phosphorus (mg/dL)  Date Value  54/12/8117 2.9   Calcium (mg/dL)  Date Value  14/78/2956 7.7 (L)   Albumin (g/dL)  Date Value  21/30/8657 3.2 (L)    Assessment/Plan: 1. Electrolytes: patient to get furosemide 60mg  IV x1. Will give potassium x1 via tube.   Will replace to goal magnesium ~2 and goal potassium ~ 4 while critically ill. Will recheck electrolytes with am labs.   2. Constipation: last documented bowel movement was 9/30. Tube feeds to restart today. Will continue senna/docusate 2 tabs VT BID. Will follow up tomorrow.  Pharmacy will continue to monitor and adjust per consult.   Broadus John, PharmD Candidate 01/25/2018 1:14 PM

## 2018-01-25 NOTE — Progress Notes (Signed)
Pharmacy Antibiotic Note  Derrick Marshall is a 37 y.o. male admitted on 12/17/2017. Patient transferred to the ICU 9/28 after an episode of unresponsiveness requiring intubation for airway protection. Concern for possible aspiration PNA. Pharmacy has been consulted for Unasyn dosing.  Plan: Continue Unasyn 3g IV Q6hr for a planned total duration of 7 days.   Height: 5\' 1"  (154.9 cm) Weight: 151 lb 14.4 oz (68.9 kg) IBW/kg (Calculated) : 52.3  Temp (24hrs), Avg:98.5 F (36.9 C), Min:98.1 F (36.7 C), Max:99.2 F (37.3 C)  Recent Labs  Lab 01/21/18 1646 01/21/18 1832 01/21/18 2153 01/22/18 0010 01/22/18 0333 01/23/18 0415 01/24/18 0505 01/25/18 0512 01/25/18 1144  WBC  --   --  8.8  --  7.0 6.1  --  5.5 5.2  CREATININE  --   --  1.38*  --  1.19 0.84 0.65 0.54*  --   LATICACIDVEN 3.0* 2.4* 3.7* 3.5*  --   --   --   --   --     Estimated Creatinine Clearance: 104.3 mL/min (A) (by C-G formula based on SCr of 0.54 mg/dL (L)).    Allergies  Allergen Reactions  . Celexa [Citalopram Hydrobromide] Other (See Comments)    Aggressive   . Zyprexa [Olanzapine] Other (See Comments)    Aggressive behavior     Antimicrobials this admission: Vancomycin 9/28 >> 9/29 Cefepime 9/28 >> 9/29 Unasyn 9/30 >> 10/6  Dose adjustments this admission: N/A  Microbiology results: 9/28 BCx: No growth x 4 days  9/28 MRSA PCR: negative  Thank you for allowing pharmacy to be a part of this patient's care.  Bohnsack,Byren L 01/25/2018 4:48 PM

## 2018-01-25 NOTE — Progress Notes (Signed)
Placed patient back into previous PRVC mode for TEE.

## 2018-01-26 DIAGNOSIS — A419 Sepsis, unspecified organism: Principal | ICD-10-CM

## 2018-01-26 DIAGNOSIS — R6521 Severe sepsis with septic shock: Secondary | ICD-10-CM

## 2018-01-26 LAB — BASIC METABOLIC PANEL
ANION GAP: 6 (ref 5–15)
BUN: 20 mg/dL (ref 6–20)
CHLORIDE: 104 mmol/L (ref 98–111)
CO2: 30 mmol/L (ref 22–32)
Calcium: 8.3 mg/dL — ABNORMAL LOW (ref 8.9–10.3)
Creatinine, Ser: 0.7 mg/dL (ref 0.61–1.24)
GFR calc Af Amer: >60 mL/min (ref >60)
GFR calc non Af Amer: >60 mL/min (ref >60)
Glucose, Bld: 244 mg/dL — ABNORMAL HIGH (ref 70–99)
POTASSIUM: 4.6 mmol/L (ref 3.5–5.1)
Sodium: 140 mmol/L (ref 135–145)

## 2018-01-26 LAB — CULTURE, BLOOD (ROUTINE X 2)
Culture: NO GROWTH
Culture: NO GROWTH
SPECIMEN DESCRIPTION: ADEQUATE
Specimen Description: ADEQUATE

## 2018-01-26 LAB — MAGNESIUM: MAGNESIUM: 2.2 mg/dL (ref 1.7–2.4)

## 2018-01-26 LAB — CBC
HEMATOCRIT: 32.8 % — AB (ref 40.0–52.0)
HEMOGLOBIN: 11.7 g/dL — AB (ref 13.0–18.0)
MCH: 38.8 pg — ABNORMAL HIGH (ref 26.0–34.0)
MCHC: 35.6 g/dL (ref 32.0–36.0)
MCV: 109.2 fL — ABNORMAL HIGH (ref 80.0–100.0)
Platelets: 133 10*3/uL — ABNORMAL LOW (ref 150–440)
RBC: 3.01 MIL/uL — AB (ref 4.40–5.90)
RDW: 16.4 % — ABNORMAL HIGH (ref 11.5–14.5)
WBC: 5.7 10*3/uL (ref 3.8–10.6)

## 2018-01-26 LAB — TROPONIN I: TROPONIN I: 0.13 ng/mL — AB (ref <0.03)

## 2018-01-26 LAB — GLUCOSE, CAPILLARY
GLUCOSE-CAPILLARY: 228 mg/dL — AB (ref 70–99)
GLUCOSE-CAPILLARY: 112 mg/dL — AB (ref 70–99)
GLUCOSE-CAPILLARY: 117 mg/dL — AB (ref 70–99)
GLUCOSE-CAPILLARY: 116 mg/dL — AB (ref 70–99)
GLUCOSE-CAPILLARY: 117 mg/dL — AB (ref 70–99)

## 2018-01-26 MED ORDER — KETOROLAC TROMETHAMINE 15 MG/ML IJ SOLN
15.0000 mg | Freq: Four times a day (QID) | INTRAMUSCULAR | Status: AC | PRN
  Administered 2018-01-26 – 2018-01-30 (×2): 15 mg via INTRAVENOUS
  Filled 2018-01-26 (×3): qty 1

## 2018-01-26 MED ORDER — ACETAMINOPHEN 325 MG PO TABS
650.0000 mg | ORAL_TABLET | Freq: Four times a day (QID) | ORAL | Status: DC | PRN
  Administered 2018-01-26 – 2018-01-28 (×2): 650 mg via ORAL
  Filled 2018-01-26: qty 2

## 2018-01-26 MED ORDER — MORPHINE SULFATE (PF) 2 MG/ML IV SOLN
2.0000 mg | Freq: Once | INTRAVENOUS | Status: AC
  Administered 2018-01-26: 2 mg via INTRAVENOUS

## 2018-01-26 MED ORDER — DEXMEDETOMIDINE HCL IN NACL 400 MCG/100ML IV SOLN
0.4000 ug/kg/h | INTRAVENOUS | Status: DC
  Administered 2018-01-26: 0.4 ug/kg/h via INTRAVENOUS
  Administered 2018-01-27: 0.6 ug/kg/h via INTRAVENOUS
  Filled 2018-01-26: qty 100

## 2018-01-26 MED ORDER — ACETAMINOPHEN 325 MG PO TABS
ORAL_TABLET | ORAL | Status: AC
  Administered 2018-01-26: 650 mg via ORAL
  Filled 2018-01-26: qty 2

## 2018-01-26 MED ORDER — INSULIN ASPART 100 UNIT/ML ~~LOC~~ SOLN
0.0000 [IU] | SUBCUTANEOUS | Status: DC
  Administered 2018-01-26: 5 [IU] via SUBCUTANEOUS
  Administered 2018-01-27 (×3): 2 [IU] via SUBCUTANEOUS
  Filled 2018-01-26 (×4): qty 1

## 2018-01-26 MED ORDER — IPRATROPIUM-ALBUTEROL 0.5-2.5 (3) MG/3ML IN SOLN
3.0000 mL | Freq: Four times a day (QID) | RESPIRATORY_TRACT | Status: DC
  Administered 2018-01-26 – 2018-01-30 (×17): 3 mL via RESPIRATORY_TRACT
  Filled 2018-01-26 (×19): qty 3

## 2018-01-26 MED ORDER — MORPHINE SULFATE (PF) 2 MG/ML IV SOLN: 2.0000 mg | INTRAVENOUS | Status: DC | PRN

## 2018-01-26 MED ORDER — LORAZEPAM 2 MG/ML IJ SOLN
1.0000 mg | INTRAMUSCULAR | Status: DC | PRN
  Administered 2018-01-26: 2 mg via INTRAVENOUS
  Filled 2018-01-26: qty 1

## 2018-01-26 MED ORDER — BENZTROPINE MESYLATE 0.5 MG PO TABS
0.5000 mg | ORAL_TABLET | Freq: Two times a day (BID) | ORAL | Status: DC
  Administered 2018-01-27 – 2018-02-01 (×12): 0.5 mg via ORAL
  Filled 2018-01-26 (×15): qty 1

## 2018-01-26 MED ORDER — SODIUM CHLORIDE 0.9 % IV BOLUS: 500.0000 mL | Freq: Once | INTRAVENOUS | Status: DC

## 2018-01-26 MED ORDER — IPRATROPIUM-ALBUTEROL 0.5-2.5 (3) MG/3ML IN SOLN: 3.0000 mL | Freq: Four times a day (QID) | RESPIRATORY_TRACT | Status: DC

## 2018-01-26 MED ORDER — INSULIN ASPART 100 UNIT/ML ~~LOC~~ SOLN: 0.0000 [IU] | SUBCUTANEOUS | Status: DC

## 2018-01-26 MED ORDER — HALOPERIDOL 2 MG PO TABS
3.0000 mg | ORAL_TABLET | Freq: Two times a day (BID) | ORAL | Status: DC
  Administered 2018-01-27 – 2018-02-01 (×12): 3 mg via ORAL
  Filled 2018-01-26 (×15): qty 1

## 2018-01-26 MED ORDER — MORPHINE SULFATE (PF) 2 MG/ML IV SOLN
INTRAVENOUS | Status: AC
  Administered 2018-01-26: 2 mg via INTRAVENOUS
  Filled 2018-01-26: qty 1

## 2018-01-26 MED ORDER — DIAZEPAM 5 MG PO TABS
5.0000 mg | ORAL_TABLET | Freq: Every evening | ORAL | Status: DC
  Administered 2018-01-26 – 2018-02-01 (×6): 5 mg via ORAL
  Filled 2018-01-26 (×6): qty 1

## 2018-01-26 NOTE — Progress Notes (Signed)
Patient extubated per MD order to room air.Patient with good cough.   Patient family member at bedside for extubation.  Patient tearful and hugging Derrick Marshall post extubation.

## 2018-01-26 NOTE — Progress Notes (Signed)
Patient able to follow all simple commands. Patient able to stick out tongue, move head side to side, lift head off pillow, lift both arms, and able to wiggle toes.  Patient able to take a deep breath on command for respiratory therapist.  Dr. Belia Heman at bedside for assessment.  Verbal order given to extubate patient.

## 2018-01-26 NOTE — Progress Notes (Signed)
PT Cancellation Note  Patient Details Name: Derrick Marshall MRN: [id number] DOB: Oct 31, 1980   Cancelled Treatment:    Reason Eval/Treat Not Completed: Medical issues which prohibited therapy(Consult received and chart reviewed.  Per primary RN, patient slightly agitated, intermittent difficulty with hypoxia.  Recommends hold this date with re-attempt next date as appropriate.  Feels patient will perform optimally if able to schedule eval at a time when sister, Bonita Quin, present.  Will continue efforts next date as medically appropriate.)   Maryfrances Portugal H. Manson Passey, PT, DPT, NCS 01/26/18, 3:10 PM [phone]

## 2018-01-26 NOTE — Progress Notes (Signed)
Pharmacy Antibiotic Note  Derrick Marshall is a 37 y.o. male admitted on 12/17/2017. Patient transferred to the ICU 9/28 after an episode of unresponsiveness requiring intubation for airway protection. Concern for possible aspiration PNA. Pharmacy has been consulted for Unasyn dosing.  Plan: Per CCM on ICU rounds at 10/3 am, will continue Unasyn 3g IV q6h for a total of 5 days with stop date tomorrow.  Height: 5\' 1"  (154.9 cm) Weight: 148 lb 13 oz (67.5 kg) IBW/kg (Calculated) : 52.3  Temp (24hrs), Avg:98.4 F (36.9 C), Min:98.2 F (36.8 C), Max:98.7 F (37.1 C)  Recent Labs  Lab 01/21/18 1646 01/21/18 1832 01/21/18 2153 01/22/18 0010 01/22/18 0333 01/23/18 0415 01/24/18 0505 01/25/18 0512 01/25/18 1144 01/26/18 0504  WBC  --   --  8.8  --  7.0 6.1  --  5.5 5.2 5.7  CREATININE  --   --  1.38*  --  1.19 0.84 0.65 0.54*  --  0.70  LATICACIDVEN 3.0* 2.4* 3.7* 3.5*  --   --   --   --   --   --     Estimated Creatinine Clearance: 103.4 mL/min (by C-G formula based on SCr of 0.7 mg/dL).    Allergies  Allergen Reactions  . Celexa [Citalopram Hydrobromide] Other (See Comments)    Aggressive   . Zyprexa [Olanzapine] Other (See Comments)    Aggressive behavior     Antimicrobials this admission: Vancomycin 9/28 >> 9/29 Cefepime 9/28 >> 9/29 Unasyn 9/30 >> 10/4  Dose adjustments this admission: N/A  Microbiology results: 9/28 BCx: No growth x 5 days  9/28 MRSA PCR: negative  Thank you for allowing pharmacy to be a part of this patient's care.  Sumedha Munnerlyn 01/26/2018 11:46 AM

## 2018-01-26 NOTE — Progress Notes (Signed)
CRITICAL CARE NOTE  CC  On vent resp failure  SUBJECTIVE On vent Critically ill Will need to check for cuff leak today On sedation   MAJOR EVENTS/TEST RESULTS: 08/24 Placed in Behavioral unit at [facility] ED, awaiting placement 09/80fund unresponsive, intubated for airway protection, admitted to ICU.  09/29 passed SBT but remained minimally responsive off of sedative infusions 09/28CT Head:No acute findings 09/29 BLE venous US:No DVT 09/29Echo:LVEF 55-60%, PASP 30-35 mmHg 10/1 MRI shows ischemic infarcts   BP 109/67   Pulse 65   Temp 98.5 F (36.9 C) (Oral)   Resp 16   Ht 5' 1"  (1.549 m)   Wt 67.5 kg   SpO2 97%   BMI 28.12 kg/m    REVIEW OF SYSTEMS  PATIENT IS UNABLE TO PROVIDE COMPLETE REVIEW OF SYSTEMS DUE TO SEVERE CRITICAL ILLNESS   PHYSICAL EXAMINATION:  GENERAL:critically ill appearing, +resp distress HEAD: Normocephalic, atraumatic.  EYES: Pupils equal, round, reactive to light.  No scleral icterus.  MOUTH: Moist mucosal membrane. NECK: Supple. No thyromegaly. No nodules. No JVD.  PULMONARY: +rhonchi, +wheezing CARDIOVASCULAR: S1 and S2. Regular rate and rhythm. No murmurs, rubs, or gallops.  GASTROINTESTINAL: Soft, nontender, -distended. No masses. Positive bowel sounds. No hepatosplenomegaly.  MUSCULOSKELETAL: No swelling, clubbing, or edema.  NEUROLOGIC: obtunded SKIN:intact,warm,dry      Indwelling Urinary Catheter continued, requirement due to   Reason to continue Indwelling Urinary Catheter for strict Intake/Output monitoring for hemodynamic instability         Ventilator continued, requirement due to, resp failure    Ventilator Sedation RASS 0 to -2     ASSESSMENT AND PLAN SYNOPSIS 37yo WM with acute encephlopathy and acute and severe resp failure complicated by acute CVA's   Severe Hypoxic and Hypercapnic Respiratory Failure -continue Full MV support -continue Bronchodilator Therapy -Wean Fio2 and PEEP as  tolerated -will perform SAT/SBTTwhen respiratory parameters are met Will check for cuff leak ans assess for extubation On steroids and lasix to help prevent  post extubation laryngeal edema    Renal Failure-most likely due to ATN -follow chem 7 -follow UO -continue Foley Catheter-assess need     NEUROLOGY - intubated and sedated - minimal sedation to achieve a RASS goal: -1   CARDIAC Limited ECHO with Bubble study pending   ID  Continue IV abx    GI/Nutrition GI PROPHYLAXIS as indicated DIET-->TF's as tolerated Constipation protocol as indicated  ELECTROLYTES -follow labs as needed -replace as needed -pharmacy consultation and following    DVT/GI PRX ordered TRANSFUSIONS AS NEEDED MONITOR FSBS ASSESS the need for LABS as needed    Critical Care Time devoted to patient care services described in this note is 357mutes.   Overall, patient is critically ill, prognosis is guarded.     KuCorrin ParkerM.D.  [facility]  Medical Director ICSedanirector [facility]

## 2018-01-26 NOTE — Consult Note (Signed)
  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  Patient is extubated now.  His mental state is no better than it was when he was in the emergency room which means that his language skills are still very impaired.  Cannot really participate in any kind of meaningful interview.  Spoke with nursing however who tell me that his behavior has been quite good today.  He is not lashing out at anyone has not been agitated and has allowed them to perform appropriate medical care.  I reviewed the medication that he was taking when he was down in the emergency room.  He was getting 5 mg of Valium in the afternoon and 3 mg of Haldol twice a day with some Cogentin along with it.  Medication was really just being tailored to control some of his agitated and volatile behavior.  I have gone ahead now that he is extubated and changed his doses back to mirror what he was taking in the emergency room.  If however he were to become agitated it would certainly be the safe thing to do to add extra medicine to control his behavior while he recovers so the proper medical care can be done.  I will follow as needed.

## 2018-01-26 NOTE — Progress Notes (Signed)
Pharmacy ICU Monitoring Consult:  Pharmacy consulted to assist in monitoring and replacing electrolytes and constipation in this 36 y.o. male admitted on 12/17/2017 and admitted to the ICU on 9/28 with acute respiratory failure.   Labs:  Sodium (mmol/L)  Date Value  01/26/2018 140   Potassium (mmol/L)  Date Value  01/26/2018 4.6   Magnesium (mg/dL)  Date Value  16/01/9603 2.2   Phosphorus (mg/dL)  Date Value  54/12/8117 2.9   Calcium (mg/dL)  Date Value  14/78/2956 8.3 (L)   Albumin (g/dL)  Date Value  21/30/8657 3.2 (L)    Assessment/Plan: 1. Electrolytes: electrolytes are WNL. No supplementation required.  Will replace to goal magnesium ~2 and goal potassium ~ 4 while critically ill. Will recheck electrolytes with am labs.   2. Constipation: patient had bowel movement today. Tube feeds to restarted yesterday. Will continue senna/docusate 2 tabs VT BID. Will follow up tomorrow. If patient has consistent bowel movements, will change to PRN.  Pharmacy will continue to monitor and adjust per consult.   Broadus John, PharmD Candidate 01/26/2018 11:42 AM

## 2018-01-26 NOTE — Progress Notes (Signed)
[employer] - [place] [facility]   PATIENT NAME: Derrick Marshall    MR#:  [id number]  DATE OF BIRTH:  [birth year]  SUBJECTIVE:  Extubated  this a.m., noted to be agitated, has lots of secretions, found to have tachycardia, hypoxia with sats of 86 room air and refusing to put oxygen on.Fredna Dow OF SYSTEMS:  Review of Systems  Unable to perform ROS: Medical condition  Physical examination: Alert but very agitated, head normocephalic, atraumatic Eyes equally reacting to light. Mouth moist mucous membranes.. Cardiovascular S1, S2 regular, tachycardic.   Lungs coarse breath sounds bilaterally. Abdomen soft, bowel sounds present Neurologically patient is awake but very agitated due to underlying Down syndrome, medical and psychological condition. Skin intact, warm and dry Musculoskeletal: No swelling, no clubbing, no edema.   DRUG ALLERGIES:   Allergies  Allergen Reactions  . Celexa [Citalopram Hydrobromide] Other (See Comments)    Aggressive   . Zyprexa [Olanzapine] Other (See Comments)    Aggressive behavior    VITALS:  Blood pressure (!) 97/58, pulse 95, temperature 98.7 F (37.1 C), temperature source Oral, resp. rate (!) 22, height 5\' 1"  (1.549 m), weight 67.5 kg, SpO2 97 %. PHYSICAL EXAMINATION:   LABORATORY PANEL:  Male CBC Recent Labs  Lab 01/26/18 0504  WBC 5.7  HGB 11.7*  HCT 32.8*  PLT 133*   ------------------------------------------------------------------------------------------------------------------ Chemistries  Recent Labs  Lab 01/22/18 0333  01/26/18 0504  NA 140   < > 140  K 4.0   < > 4.6  CL 107   < > 104  CO2 26   < > 30  GLUCOSE 113*   < > 244*  BUN 15   < > 20  CREATININE 1.19   < > 0.70  CALCIUM 8.3*   < > 8.3*  MG 2.0   < > 2.2  AST 63*  --   --   ALT 35  --   --   ALKPHOS 93  --   --   BILITOT 0.9  --   --    < > = values in this interval not displayed.   RADIOLOGY:  No results found. ASSESSMENT  AND PLAN:   Acute respiratory failure with hypoxia and hypercapnia- likely secondary to aspiration pneumonia vs HCAP. Procalcitonin elevated to 0.58. Intubated.,  Extubated this morning.  Still having occasional gurgling, hypoxia.  Monitor closely, suction as needed.  Daniel bronchodilators, IV steroid, frequent suctioning.  - Septic shock- likely due to pneumonia. -Improving.,  Continue Unasyn.  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   Lactic acidosis- persistent, likely due to shock, Unasyn to cover aspiration pneumonia Elevated troponin- likely due to demand ischemia. ECHO with EF 55-60%.  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 Management plans discussed with the patient's sister and they are in agreement.  CODE STATUS: Full Code  TOTAL TIME TAKING CARE OF THIS PATIENT: 35 minutes.   More than 50% of the time was spent in counseling/coordination of care: YES  D/C UNCLEAR, DEPENDING ON CLINICAL CONDITION.   Katha Hamming M.D on 01/26/2018 at 12:50 PM  Between 7am to 6pm - Pager - [phone]  After 6pm go to [email or web address] - Therapist, nutritional Hospitalists

## 2018-01-26 NOTE — Progress Notes (Signed)
Patient was extubated per Dr. Belia Heman order. Patient had no respiratory distress and no stridor present. Patient's oxygen saturation is 95% on room air.

## 2018-01-26 NOTE — Progress Notes (Signed)
LCSW had brief discussion with patients guardian ( sister Bonita Quin and offered support) She reports Brayln is doing better. LCSW let her know I will be here this weekend if she needs any support.  [employer] LCSW [phone]

## 2018-01-26 NOTE — Progress Notes (Signed)
A leak test was performed and there was a cuff leak. Dr. Belia Heman at bedside and observed.

## 2018-01-26 NOTE — Progress Notes (Signed)
Tylenol 650mg PO given for elevated temperature. 

## 2018-01-26 NOTE — Progress Notes (Signed)
Patient has not voided since foley catheter removed.  Bladder scan revealed in bladder.  Patient verbalized that he doesn't have to "pee".  Will continue to monitor.

## 2018-01-26 NOTE — Progress Notes (Signed)
OT Cancellation Note  Patient Details Name: Derrick Marshall MRN: [id number] DOB: [birth year]   Cancelled Treatment:    Reason Eval/Treat Not Completed: Patient not medically ready. Order received, chart reviewed. Pt noted to have been extubated this am. Will hold OT evaluation at least 24 hours to ensure pt is medically stable following extubation. Will re-attempt OT evaluation at later date/time as pt is medically appropriate.   Richrd Prime, MPH, MS, OTR/L ascom [phone] 01/26/18, 10:59 AM

## 2018-01-27 ENCOUNTER — Inpatient Hospital Stay: Payer: Medicare Other

## 2018-01-27 DIAGNOSIS — R109 Unspecified abdominal pain: Secondary | ICD-10-CM

## 2018-01-27 LAB — BASIC METABOLIC PANEL
Anion gap: 7 (ref 5–15)
BUN: 22 mg/dL — AB (ref 6–20)
CHLORIDE: 108 mmol/L (ref 98–111)
CO2: 29 mmol/L (ref 22–32)
Calcium: 8.3 mg/dL — ABNORMAL LOW (ref 8.9–10.3)
Creatinine, Ser: 1.02 mg/dL (ref 0.61–1.24)
GFR calc non Af Amer: >60 mL/min (ref >60)
Glucose, Bld: 154 mg/dL — ABNORMAL HIGH (ref 70–99)
POTASSIUM: 3.7 mmol/L (ref 3.5–5.1)
SODIUM: 144 mmol/L (ref 135–145)

## 2018-01-27 LAB — GLUCOSE, CAPILLARY
GLUCOSE-CAPILLARY: 147 mg/dL — AB (ref 70–99)
Glucose-Capillary: 123 mg/dL — ABNORMAL HIGH (ref 70–99)
Glucose-Capillary: 131 mg/dL — ABNORMAL HIGH (ref 70–99)
Glucose-Capillary: 88 mg/dL (ref 70–99)

## 2018-01-27 LAB — TROPONIN I
Troponin I: 0.08 ng/mL (ref <0.03)
Troponin I: 0.06 ng/mL (ref <0.03)

## 2018-01-27 LAB — CBC
HEMATOCRIT: 28.1 % — AB (ref 40.0–52.0)
HEMOGLOBIN: 10.1 g/dL — AB (ref 13.0–18.0)
MCH: 38.6 pg — AB (ref 26.0–34.0)
MCHC: 36.1 g/dL — ABNORMAL HIGH (ref 32.0–36.0)
MCV: 107 fL — ABNORMAL HIGH (ref 80.0–100.0)
Platelets: 121 10*3/uL — ABNORMAL LOW (ref 150–440)
RBC: 2.63 MIL/uL — AB (ref 4.40–5.90)
RDW: 16.2 % — ABNORMAL HIGH (ref 11.5–14.5)
WBC: 7.4 10*3/uL (ref 3.8–10.6)

## 2018-01-27 MED ORDER — CHLORHEXIDINE GLUCONATE 0.12 % MT SOLN
15.0000 mL | Freq: Two times a day (BID) | OROMUCOSAL | Status: DC
  Administered 2018-01-27 – 2018-02-02 (×10): 15 mL via OROMUCOSAL
  Filled 2018-01-27 (×10): qty 15

## 2018-01-27 MED ORDER — VITAMIN B-12 100 MCG PO TABS
100.0000 ug | ORAL_TABLET | Freq: Every day | ORAL | Status: DC
  Administered 2018-01-28 – 2018-02-01 (×5): 100 ug via ORAL
  Filled 2018-01-27 (×7): qty 1

## 2018-01-27 MED ORDER — ORAL CARE MOUTH RINSE
15.0000 mL | Freq: Two times a day (BID) | OROMUCOSAL | Status: DC
  Administered 2018-01-27 – 2018-02-01 (×5): 15 mL via OROMUCOSAL

## 2018-01-27 MED ORDER — ASPIRIN 81 MG PO CHEW
324.0000 mg | CHEWABLE_TABLET | Freq: Every day | ORAL | Status: DC
  Administered 2018-01-27 – 2018-02-02 (×7): 324 mg via ORAL
  Filled 2018-01-27 (×8): qty 4

## 2018-01-27 NOTE — Clinical Social Work Note (Signed)
CSW received voicemail that was transferred from RN CM. ICU nurse was asking if someone could speak with Modena Slater (patient's [employer] rep) as she was here visiting with patient and sister. CSW spoke with patient's sister outside of patient's room while nursing performed patient care. Patient's sister vocalized that CSW should speak with Ms. Nolen Mu as she was wanting to know what the plan will be upon discharge. CSW explained to patient's sister that PT is recommending short term rehab in a nursing home facility. Patient's sister states that patient can do much more than he is letting on and that they will need to be firm with him and not let him just say that he doesn't want to do something. Patient's sister states he does better with her around and that he will do more for her. CSW asked nursing to try and coordinate PT coming while patient's sister is here so that a more accurate assessment can be done. CSW attempted to call Ms. McKinney at [phone] and her voice mail box was full.  York Spaniel MSW,LCSW [phone]
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
## 2018-01-27 NOTE — Progress Notes (Signed)
Subjective: Patient extubated.  Awake and alert.    Objective: Current vital signs: BP 126/75   Pulse 94   Temp 97.8 F (36.6 C) (Axillary)   Resp 19   Ht _0  (1.549 m)   Wt 67.5 kg   SpO2 96%   BMI 28.12 kg/m  Vital signs in last 24 hours: Temp:  [97.8 F (36.6 C)-100.4 F (38 C)] 97.8 F (36.6 C) (10/04 1209) Pulse Rate:  [58-120] 94 (10/04 1209) Resp:  [12-27] 19 (10/04 1209) BP: (88-126)/(47-75) 126/75 (10/04 1100) SpO2:  [89 %-100 %] 96 % (10/04 1209) FiO2 (%):  [40 %] 40 % (10/04 0731)  Intake/Output from previous day: 10/03 0701 - 10/04 0700 In: 1181.2 [I.V.:131.5; IV Piggyback:1049.7] Out: 640 [Urine:640] Intake/Output this shift: No intake/output data recorded. Nutritional status:  Diet Order            DIET - DYS 1 Room service appropriate? Yes with Assist; Fluid consistency: Nectar Thick  Diet effective now              Neurologic Exam: Mental Status: Alert.  Speech dysarthric.  Follows some simple commands.  Uncooperative Cranial Nerves: II: Blinks to bilateral confrontatiion III,IV, VI: Right esotropia V,VII: Face symmetric Motor: Moves all extremities against gravity   Lab Results: Basic Metabolic Panel: Recent Labs  Lab 01/22/18 0333 01/23/18 0415 01/24/18 0505 01/25/18 0512 01/26/18 0504 01/27/18 0204  NA 140 138 143 139 140 144  K 4.0 3.9 4.1 3.8 4.6 3.7  CL 107 107 111 107 104 108  CO2 _1 GLUCOSE 113* 141* 103* 108* 244* 154*  BUN _2 22*  CREATININE 1.19 0.84 0.65 0.54* 0.70 1.02  CALCIUM 8.3* 8.6* 8.3* 7.7* 8.3* 8.3*  MG 2.0  --  1.7 2.1 2.2  --   PHOS 2.9  --  2.9  --   --   --     Liver Function Tests: Recent Labs  Lab 01/21/18 1645 01/22/18 0333  AST 66* 63*  ALT 38 35  ALKPHOS 100 93  BILITOT 0.5 0.9  PROT 6.8 6.5  ALBUMIN 3.4* 3.2*   No results for input(s): LIPASE, AMYLASE in the last 168 hours. No results for input(s): AMMONIA in the last 168 hours.  CBC: Recent Labs  Lab  01/20/18 2140  01/22/18 0333 01/23/18 0415 01/25/18 0512 01/25/18 1144 01/26/18 0504 01/27/18 0204  WBC 5.5   < > 7.0 6.1 5.5 5.2 5.7 7.4  NEUTROABS 4.4  --  5.1  --   --   --   --   --   HGB 13.6   < > 12.8* 11.8* 8.8* 10.0* 11.7* 10.1*  HCT 38.3*   < > 35.9* 33.7* 24.5* 28.2* 32.8* 28.1*  MCV 106.0*   < > 107.4* 107.6* 110.1* 109.0* 109.2* 107.0*  PLT 132*   < > 135* 141* 115* 110* 133* 121*   < > = values in this interval not displayed.    Cardiac Enzymes: Recent Labs  Lab 01/22/18 0333 01/22/18 0941 01/26/18 2109 01/27/18 0204 01/27/18 0826  TROPONINI 0.65* 0.44* 0.13* 0.08* 0.06*    Lipid Panel: Recent Labs  Lab 01/21/18 2203  TRIG 70    CBG: Recent Labs  Lab 01/26/18 1344 01/26/18 1656 01/26/18 2101 01/27/18 0744 01/27/18 1148  GLUCAP 117* 116* 117* 147* 123*    Microbiology: Results for orders placed or performed during the hospital encounter of 12/17/17  Blood culture (routine x  2)     Status: None   Collection Time: 01/21/18  6:25 PM  Result Value Ref Range Status   Specimen Description BLOOD Blood Culture adequate volume  Final   Special Requests   Final    BOTTLES DRAWN AEROBIC AND ANAEROBIC LEFT ANTECUBITAL   Culture   Final    NO GROWTH 5 DAYS Performed at [facility], [address], [place], [place] [address]    Report Status 01/26/2018 FINAL  Final  Blood culture (routine x 2)     Status: None   Collection Time: 01/21/18  6:32 PM  Result Value Ref Range Status   Specimen Description BLOOD Blood Culture adequate volume  Final   Special Requests   Final    BOTTLES DRAWN AEROBIC AND ANAEROBIC RIGHT ANTECUBITAL   Culture   Final    NO GROWTH 5 DAYS Performed at [facility], [address], [place], [place] [address]    Report Status 01/26/2018 FINAL  Final  MRSA PCR Screening     Status: None   Collection Time: 01/21/18  9:36 PM  Result Value Ref Range Status   MRSA by PCR NEGATIVE NEGATIVE Final    Comment:         The GeneXpert MRSA Assay (FDA approved for NASAL specimens only), is one component of a comprehensive MRSA colonization surveillance program. It is not intended to diagnose MRSA infection nor to guide or monitor treatment for MRSA infections. Performed at [facility], [address] [place], [place] [address]     Coagulation Studies: Recent Labs    01/25/18 0512  LABPROT 14.8  INR 1.17    Imaging: No results found.  Medications:  I have reviewed the patient's current medications. Scheduled: . aspirin  324 mg Oral Daily  . benztropine  0.5 mg Oral BID  . chlorhexidine gluconate (MEDLINE KIT)  15 mL Mouth Rinse BID  . diazepam  5 mg Oral QPM  . enoxaparin (LOVENOX) injection  40 mg Subcutaneous Q24H  . haloperidol  3 mg Oral BID  . insulin aspart  0-15 Units Subcutaneous Q4H  . ipratropium-albuterol  3 mL Nebulization Q6H  . ketotifen  1 drop Both Eyes BID  . multivitamin  15 mL Per Tube Daily  . senna-docusate  1 tablet Per Tube BID  . vitamin B-12  100 mcg Per Tube Daily    Assessment/Plan: Patient now extubated and awake.  Appears at baseline cognitively.    No further neurologic intervention is recommended at this time.  If further questions arise, please call or page at that time.  Thank you for allowing neurology to participate in the care of this patient.   LOS: 6 days   Alexis Goodell, MD Neurology [phone] 01/27/2018  12:28 PM

## 2018-01-27 NOTE — Progress Notes (Signed)
Nutrition Follow-up  DOCUMENTATION CODES:   Not applicable  INTERVENTION:  Provide Magic cup TID with meals, each supplement provides 290 kcal and 9 grams of protein.  Will discontinue order for liquid MVI as that was part of tube feeding regimen.  NUTRITION DIAGNOSIS:   Inadequate oral intake related to inability to eat as evidenced by NPO status.  Resolving as diet was advanced today.  GOAL:   Patient will meet greater than or equal to 90% of their needs  Progressing.  MONITOR:   PO intake, Supplement acceptance, Diet advancement, Labs, Weight trends, I & O's  REASON FOR ASSESSMENT:   Ventilator    ASSESSMENT:   37 year old male with PMHx of Down syndrome, autism who was admitted with acute unresponsiveness secondary to polypharmacy requiring intubation on 9/28 for airway protection.   -Patient was extubated on 10/3 and OGT was removed. Overnight patient required intermittent BiPAP but is now on room air. -Following SLP evaluation today diet was advanced to dysphagia 1 with nectar-thick liquids.  Met with patient and his sister yesterday and patient again today. Patient is ready to eat. He is requesting smoothies. SLP has approved smoothies for patient as long as they are made thick and he is supervised while drinking them. His sister reports he typically has a good appetite and intake.  Medications reviewed and include: Haldol 3 mg BID, Novolog 0-15 units Q4hrs, liquid MVI daily per tube, senna-docusate 1 tablet BID, vitamin B12 100 micrograms daily, Unasyn.  Labs reviewed: CBG 123-147, BUN 22.  I/O: 640 mL UOP yesterday (0.4 mL/kg/hr); 1 BM yesterday  Weight trend: 67.5 kg on 10/3; -0.5 kg from admission weight  Discussed with RN and on rounds.  Diet Order:   Diet Order            DIET - DYS 1 Room service appropriate? Yes with Assist; Fluid consistency: Nectar Thick  Diet effective now              EDUCATION NEEDS:   No education needs have been  identified at this time  Skin:  Skin Assessment: Reviewed RN Assessment  Last BM:  01/26/2018 - large type 6  Height:   Ht Readings from Last 1 Encounters:  12/17/17 5' 1"  (1.549 m)    Weight:   Wt Readings from Last 1 Encounters:  01/26/18 67.5 kg    Ideal Body Weight:  50.9 kg  BMI:  Body mass index is 28.12 kg/m.  Estimated Nutritional Needs:   Kcal:  1760-2050 (MSJ x 1.2-1.4)  Protein:  82-102 grams/kg (1.2-1.5 grams/kg)  Fluid:  2 L/day (30 mL/kg)  Willey Blade, MS, RD, LDN Office: [phone] Pager: [phone] After Hours/Weekend Pager: [phone]

## 2018-01-27 NOTE — Progress Notes (Signed)
PULMONARY / CRITICAL CARE MEDICINE   Name: Derrick Marshall MRN: [id number] DOB: July 08, 1980    ADMISSION DATE:  12/17/2017  BRIEF SUMMARY: 37 y.o. Male who has been in the ED Behavorial unit at [facility] awaiting placement for approximately a month due to aggressive behavior.  On 9/28 he became unresponsive requiring intubation for airway protection.  CXR with concern for HCAP vs Pulmonary edema.  MAJOR EVENTS/TEST RESULTS: 08/24 Placed in Behavioral unit at [facility] ED, awaiting placement 09/28 found unresponsive, intubated for airway protection, admitted to ICU.  09/29 passed SBT but remained minimally responsive off of sedative infusions 09/28 CT Head: No acute findings 09/29 BLE venous US: No DVT 09/29 Echo: LVEF 55-60%, PASP 30-35 mmHg 10/3 Pt successfully extubated  INDWELLING DEVICES:: ETT 09/28 >>10/3   MICRO DATA: MRSA PCR 09/28 >>NEG Blood 09/28 >>NEG  ANTIMICROBIALS:  Vanc 09/28 >> 09/29 Cefepime 09/28 >> 09/29  SUBJECTIVE:  Pt got agitated overnight requiring ativan and precedex gtt  VITAL SIGNS: BP 101/66   Pulse 63   Temp 98.1 F (36.7 C) (Axillary)   Resp 12   Ht 5\' 1"  (1.549 m)   Wt 67.5 kg   SpO2 96%   BMI 28.12 kg/m   HEMODYNAMICS:    VENTILATOR SETTINGS: Vent Mode: PSV;CPAP FiO2 (%):  [24 %-40 %] 40 % PEEP:  [5 cmH20] 5 cmH20 Pressure Support:  [5 cmH20] 5 cmH20  INTAKE / OUTPUT: I/O last 3 completed shifts: In: 2319.6 [I.V.:225.9; NG/GT:844; IV Piggyback:1249.7] Out: 1215 [Urine:1215]  PHYSICAL EXAMINATION: General: well developed, well nourished, NAD  Neuro: alert, follows commands HEENT: supple, no JVD  Cardiovascular:  Reg, II/IV syst M Lungs: diminished, even, non labored  Abdomen: ND, + BS, no masses, soft Ext: warm, no edema Skin: No lesions noted  LABS:  BMET Recent Labs  Lab 01/25/18 0512 01/26/18 0504 01/27/18 0204  NA 139 140 144  K 3.8 4.6 3.7  CL 107 104 108  CO2 25 30 29   BUN 17 20 22*  CREATININE 0.54* 0.70 1.02   GLUCOSE 108* 244* 154*    Electrolytes Recent Labs  Lab 01/22/18 0333  01/24/18 0505 01/25/18 0512 01/26/18 0504 01/27/18 0204  CALCIUM 8.3*   < > 8.3* 7.7* 8.3* 8.3*  MG 2.0  --  1.7 2.1 2.2  --   PHOS 2.9  --  2.9  --   --   --    < > = values in this interval not displayed.    CBC Recent Labs  Lab 01/25/18 1144 01/26/18 0504 01/27/18 0204  WBC 5.2 5.7 7.4  HGB 10.0* 11.7* 10.1*  HCT 28.2* 32.8* 28.1*  PLT 110* 133* 121*    Coag's Recent Labs  Lab 01/25/18 0512  INR 1.17    Sepsis Markers Recent Labs  Lab 01/21/18 1832 01/21/18 2153 01/22/18 0010 01/23/18 0415 01/24/18 0505  LATICACIDVEN 2.4* 3.7* 3.5*  --   --   PROCALCITON  --  0.58  --  0.48 0.26    ABG Recent Labs  Lab 01/21/18 1822 01/22/18 0454 01/25/18 1143  PHART 7.49* 7.34* 7.46*  PCO2ART 31* 45 41  PO2ART 107 100 120*    Liver Enzymes Recent Labs  Lab 01/21/18 1645 01/22/18 0333  AST 66* 63*  ALT 38 35  ALKPHOS 100 93  BILITOT 0.5 0.9  ALBUMIN 3.4* 3.2*    Cardiac Enzymes Recent Labs  Lab 01/22/18 0941 01/26/18 2109 01/27/18 0204  TROPONINI 0.44* 0.13* 0.08*    Glucose Recent Labs  Lab 01/26/18 0548 01/26/18 1229 01/26/18 1344 01/26/18 1656 01/26/18 2101 01/27/18 0744  GLUCAP 228* 112* 117* 116* 117* 147*    CXR: minimal edema pattern   ASSESSMENT / PLAN:  PULMONARY A: VDRF. Intubated for depressed LOC: extubated 10/3 P:   Supplemental O2 for dyspnea and/or hypoxia  Scheduled bronchodilator therapy   CARDIOVASCULAR A:  Hypotension Elevated troponin likely demand ischemia secondary to respiratory failure  H/O Tetralogy of Fallot, S/P surgical correction P:  ICU hemodynamic monitoring Norepinephrine as needed to maintain MAP > 65 mmHg US Carotid Bilateral pending   RENAL A:   AKI-resolved  P:   Monitor BMET intermittently Monitor I/Os Correct electrolytes as indicated  GASTROINTESTINAL A:   No acute issues P:   SUP: IV  famotidine Continue current diet per speech therapy recommendations  HEMATOLOGIC A:   Mild Thrombocytopenia P:  DVT px: enoxaparin Monitor CBC intermittently Transfuse per usual guidelines  INFECTIOUS A:   Aspiration Pneumonia  P:   Monitor temp, WBC count Micro and abx as above Continue Unasyn stop date 10/4  ENDOCRINE A:   Mild hyperglycemia without hx of DM P:   Monitor glu on chem panels Consider SSI for glu > 160   NEUROLOGIC A:   Acute encephalopathy-improving  Down's syndrome Autism History of violent behvior P:   Psychiatry consulted appreciate input  Sitter at bedside Per psych recommendations continue haldol and valium  Prn morphine and toradol for pain management   FAMILY  No family at bedside to update at this time.    Sonda Rumble, AGNP  Pulmonary/Critical Care Pager [phone] (please enter 7 digits) PCCM Consult Pager [phone] (please enter 7 digits)

## 2018-01-27 NOTE — Evaluation (Addendum)
Clinical/Bedside Swallow Evaluation Patient Details  Name: Derrick Marshall MRN: [id number] Date of Birth: 12/24/1980  Today's Date: 01/27/2018 Time: SLP Start Time (ACUTE ONLY): 0805 SLP Stop Time (ACUTE ONLY): 0905 SLP Time Calculation (min) (ACUTE ONLY): 60 min  Past Medical History:  Past Medical History:  Diagnosis Date  . Autism   . Down syndrome   . Tetralogy of Fallot    Past Surgical History:  Past Surgical History:  Procedure Laterality Date  . TETRALOGY OF FALLOT REPAIR     HPI:  Pt is a 37 y/o male w/ baseline of Autism and Down Syndrome w/ report of Aggressive behavior. He resides at a Group Home. Pt presented to ER from [facility] setting due to aggressive behavior, in ER x1 month pending work up and placement to [facility].  Complicated by episode of unresponsive episode requiring mechanical intubation (9/28-10/3) and admission (work-up including recent labs showed elevated lactic acid levels, urine toxicology positive for benzodiazepine, abnormal blood gas showing respiratory alkalosis.  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  Admitted for sepsis due to HCAP.  Pt has a Comptroller present at all times. Pt is verbally conversive w/ mumbled/muttered speech, dysarthric speech. Per Neurologist's note, pt appears at baseline Cognitively; Right esotropia noted.    Assessment / Plan / Recommendation Clinical Impression  Pt appeared to present w/ oropharyngeal phase dysphagia w/ suspected impact from his baseline Cognitive decline from Autism and Down Syndrome (impulsive eating/drining behavior) as well as new Neurological impact from acute events as per MRI  noted this hospitalization. Pt exhibited overt s/s of aspiration c/b delayed, overt coughing post trials of thin liquids. This was not noted during trials of Nectar consistency liquids (via cup/straw). Pt also consumed few trials of puree w/ no overt deficits noted. Oral phase was grossly [facility] for adequate bolus management and clearing of trials given. Pt is missing dentition which impacts his ability to effectively mastication solids. OM exam appeared grossly adequate w/ no unilateral labial/lingual weakness noted. Pt required Mod verbal/visual cues w/ encouragement during the session; assistance w/ feeding. Due to his current medical status and baseline Cognitive status, recommend initiation of a Dysphagia level 1 (PUREE) w/ NECTAR consistency liquids w/ strict aspiration precautions and montioring of pt during all oral intake to reduce any impulsive eating/drinking behavior to reduce risk for choking, aspiration. Recommend Pills in Puree - Crushed as able. ST services will f/u w/ trials to upgrade diet as appropriate while admitted. NSG updated. SLP Visit Diagnosis: Dysphagia, oropharyngeal phase (R13.12)    Aspiration Risk  Mild aspiration risk;Moderate aspiration risk;Risk for inadequate nutrition/hydration    Diet Recommendation  Dysphagia level 1 (PUREE) w/ NECTAR consistency liquids; aspiration precautions; monitoring during all oral intake to lessen any impulsive feeding behavior, positioning upright to eat/drink  Medication Administration: Crushed with puree(for safer swallowing)    Other  Recommendations Recommended Consults: (Dietician f/u) Oral Care Recommendations: Oral care BID;Staff/trained caregiver to provide oral care Other Recommendations: Order thickener from pharmacy;Prohibited food (jello, ice cream, thin soups);Remove water pitcher;Have oral suction available   Follow up Recommendations (TBD)      Frequency and Duration min 3x week  2 weeks       Prognosis Prognosis for  Safe  Diet Advancement: Fair Barriers to Reach Goals: Cognitive deficits;Time post onset;Severity of deficits;Behavior      Swallow Study   General Date of Onset: 12/17/17 HPI: Pt is a 37 y/o male w/ baseline of Autism and Down Syndrome w/ report of Aggressive behavior. He resides at a Group Home. Pt presented to ER from [facility] setting due to aggressive behavior, in ER x1 month pending work up and placement to [facility].  Complicated by episode of unresponsive episode requiring mechanical intubation (9/28-10/3) and admission (work-up including recent labs showed elevated lactic acid levels, urine toxicology positive for benzodiazepine, abnormal blood gas showing respiratory alkalosis.  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  Admitted for sepsis due to HCAP.  Pt has a Comptroller present at all times. Pt is verbally conversive w/ mumbled/muttered speech, dysarthric speech. Per Neurologist's note, pt appears at baseline Cognitively; Right esotropia noted.  Type of Study: Bedside Swallow Evaluation Previous Swallow Assessment: none reported Diet Prior to this Study: NPO(regular diet in the ED for ~1 month) Temperature Spikes Noted: No(wbc 7.4) Respiratory Status: Nasal cannula(3-4 liters) History of Recent Intubation: Yes Length of Intubations (days): 6 days Date extubated: 01/26/18 Behavior/Cognition: Cooperative;Pleasant mood;Confused;Distractible;Requires cueing;Impulsive(Awake, baseline Austism and Down Syndrome) Oral Cavity Assessment: Dry(difficult to assess d/t pt's cooperation) Oral Care Completed by SLP: Recent completion by staff Oral Cavity - Dentition: Missing  dentition(many) Vision: (n/a) Self-Feeding Abilities: Total assist Patient Positioning: Upright in bed(needed positioning) Baseline Vocal Quality: Normal(mumbled/muttered speech) Volitional Cough: Cognitively unable to elicit Volitional Swallow: Unable to elicit    Oral/Motor/Sensory Function Overall Oral Motor/Sensory Function: (appeared grossly WFL for bolus management/clearing)   Ice Chips Ice chips: Within functional limits Presentation: Spoon(fed; 2 trials)   Thin Liquid Thin Liquid: Impaired Presentation: Straw(assisted; ~5 swallows) Oral Phase Impairments: (adequate) Oral Phase Functional Implications: (adequate) Pharyngeal  Phase Impairments: Cough - Delayed(x1 post swallowing and talking) Other Comments: pt is impulsive w/ drinking    Nectar Thick Nectar Thick Liquid: Within functional limits Presentation: Straw(assisted; ~4 ozs total) Other Comments: pt is impulsive w/ drinking   Honey Thick Honey Thick Liquid: Not tested   Puree Puree: Within functional limits Presentation: Spoon(fed; 4 trials - adequate management/clearing) Other Comments: pt would not accept more   Solid     Solid: Not tested       Jerilynn Som, MS, CCC-SLP Watson,Katherine 01/27/2018,2:21 PM

## 2018-01-27 NOTE — Progress Notes (Signed)
PT Cancellation Note  Patient Details Name: Derrick Marshall MRN: [id number] DOB: June 08, 1980   Cancelled Treatment:    Reason Eval/Treat Not Completed: Other (comment). Notified by staff that pt's sister requesting additional treatment this PM as she thinks pt would perform better in her presence. This writer came to CCU to perform treatment, however sister had left for the day. As pt had already had PT this date, did not feel results would be significantly different this PM. Per RN, sister will be back tomorrow. Will re-attempt treatment tomorrow, if possible, while sister is present.    Kaston Faughn 01/27/2018, 4:44 PM  Elizabeth Palau, PT, DPT [phone]

## 2018-01-27 NOTE — Evaluation (Signed)
Physical Therapy Evaluation Patient Details Name: Derrick Marshall MRN: [id number] DOB: 02-Jul-1980 Today's Date: 01/27/2018   History of Present Illness  presented to ER from group home setting due to aggressive behavior, in ER x1 month pending work up and placement.  Complicated by episode of unresponsive episode requiring mechanical intubation (9/28-10/3) and admission (due to PNA vs pulmonary edema).  Admitted for sepsis due to HCAP.  MRI completed during hospitalization significant for punctate ischemic frontal and cerebellar infarcts, also suggestive of anoxic brain injury.  Clinical Impression  Upon evaluation, patient alert and oriented to self only; inconsistently follows simple commands throughout session.  Speech generally garbled and incoherent, consistently babbling, calling out during session.  Bilat UE/LE generally weakened, at least 3-/5 throughout and lacking approx 5-7 degrees bilat ankle DF in supine.  Bed mobility and OOB attempts deferred this date due to slight agitation and arrival of lunch tray; will continue to assess and progress as appropriate. Discussed with primary RN, requested she discuss patient's PLOF with sister when she arrives (did not arrive during AM time this date) to assist with understanding of baseline and goal formulation.  To communication information once received. Would benefit from skilled PT to address above deficits and promote optimal return to PLOF; recommend transition to STR upon discharge from acute hospitalization.     Follow Up Recommendations SNF (pending ability to participate/progress)     Equipment Recommendations       Recommendations for Other Services       Precautions / Restrictions Precautions Precautions: Fall Precaution Comments: Nectar liquids Restrictions Weight Bearing Restrictions: No      Mobility  Bed Mobility               General bed mobility comments: deferred due to patient agitation, arrival of lunch  tray  Transfers                 General transfer comment: deferred due to patient agitation, arrival of lunch tray  Ambulation/Gait             General Gait Details: deferred due to patient agitation, arrival of lunch tray  Stairs            Wheelchair Mobility    Modified Rankin (Stroke Patients Only)       Balance                                             Pertinent Vitals/Pain Pain Assessment: No/denies pain    Home Living Family/patient expects to be discharged to:: Private residence Living Arrangements: Group Home               Additional Comments: Patient unable to provide social history; family not available.  Will confirm in subsequent sessions as able.    Prior Function           Comments: Patient unable to provide social history; family not available.  Will confirm in subsequent sessions as able.     Hand Dominance        Extremity/Trunk Assessment   Upper Extremity Assessment Upper Extremity Assessment: Generalized weakness(grossly at least 3-/5)    Lower Extremity Assessment Lower Extremity Assessment: Generalized weakness(grossly 3-/5 throughout LEs; bilat ankles (R > L) lacking 5-7 degrees of passive DF. Multi-beat, non-sustained clonus bilat ankles)       Communication   Communication: (speech significantly garbled,  largely incoherent)  Cognition Arousal/Alertness: Awake/alert Behavior During Therapy: WFL for tasks assessed/performed Overall Cognitive Status: No family/caregiver present to determine baseline cognitive functioning                                        General Comments      Exercises Other Exercises Other Exercises: Supine LE therex, 1x10, act assist vs passive ROM: ankle pumps, hip/knee flexion and extension, abduct/adduct. Inconsistent active effort and command following. Other Exercises: Alternate UE diagonal reaching/target tapping, 1x3 (for 'high  fives'); difficulty with shoulder elevation past shoulder height, limited willingness to cross midline at this time.   Assessment/Plan    PT Assessment Patient needs continued PT services  PT Problem List Decreased strength;Decreased range of motion;Decreased activity tolerance;Decreased balance;Decreased mobility;Decreased coordination;Decreased cognition;Decreased knowledge of precautions;Cardiopulmonary status limiting activity       PT Treatment Interventions DME instruction;Gait training;Functional mobility training;Therapeutic activities;Therapeutic exercise;Balance training;Patient/family education    PT Goals (Current goals can be found in the Care Plan section)  Acute Rehab PT Goals PT Goal Formulation: Patient unable to participate in goal setting Time For Goal Achievement: 02/10/18 Potential to Achieve Goals: Fair    Frequency Min 2X/week   Barriers to discharge Decreased caregiver support      Co-evaluation               AM-PAC PT "6 Clicks" Daily Activity  Outcome Measure Difficulty turning over in bed (including adjusting bedclothes, sheets and blankets)?: Unable Difficulty moving from lying on back to sitting on the side of the bed? : Unable Difficulty sitting down on and standing up from a chair with arms (e.g., wheelchair, bedside commode, etc,.)?: Unable Help needed moving to and from a bed to chair (including a wheelchair)?: Total Help needed walking in hospital room?: Total Help needed climbing 3-5 steps with a railing? : Total 6 Click Score: 6    End of Session Equipment Utilized During Treatment: Gait belt Activity Tolerance: Treatment limited secondary to agitation Patient left: in bed;with nursing/sitter in room Nurse Communication: Mobility status PT Visit Diagnosis: Muscle weakness (generalized) (M62.81);Difficulty in walking, not elsewhere classified (R26.2)    Time: 3086-5784 PT Time Calculation (min) (ACUTE ONLY): 11 min   Charges:    PT Evaluation $PT Eval Moderate Complexity: 1 Mod         Ayriana Wix H. Manson Passey, PT, DPT, NCS 01/27/18, 1:22 PM [phone]

## 2018-01-27 NOTE — Progress Notes (Signed)
Pharmacy ICU Monitoring Consult:  Pharmacy consulted to assist in monitoring and replacing electrolytes and constipation in this 37 y.o. male admitted on 12/17/2017 and admitted to the ICU on 9/28 with acute respiratory failure.   Labs:  Sodium (mmol/L)  Date Value  01/27/2018 144   Potassium (mmol/L)  Date Value  01/27/2018 3.7   Magnesium (mg/dL)  Date Value  19/62/2297 2.2   Phosphorus (mg/dL)  Date Value  98/92/1194 2.9   Calcium (mg/dL)  Date Value  17/40/8144 8.3 (L)   Albumin (g/dL)  Date Value  81/85/6314 3.2 (L)    Assessment/Plan: 1. Electrolytes: electrolytes WNL. No supplementation required. Pharmacy will not order electrolytes with morning labs.  Will replace to goal magnesium ~2 and goal potassium ~ 4  2. Constipation: BM documented 10/3. Will continue senna/docusate 2 tabs PO BID. Will continue to follow for need to decrease bowel regimen.  Pharmacy will continue to monitor and adjust per consult.   Pricilla Riffle, PharmD Pharmacy Resident  01/27/2018 2:08 PM

## 2018-01-27 NOTE — Progress Notes (Signed)
   01/27/18 1345  Clinical Encounter Type  Visited With Patient and family together  Visit Type Follow-up;Spiritual support  Referral From Nurse  Consult/Referral To Chaplain  Spiritual Encounters  Spiritual Needs Prayer;Emotional;Grief support   [facility] visited with the patient while rounding on the ICU. I have followed Mr. Derrick Marshall since he was first admitted to the ICU after a medical incident in the ED on 21 January 2018. The patient is now awake and talking with his sister Vaughan Basta and the Conservation officer, historic buildings. I was encouraged when the patient remembered my name from seeing me 2 weeks ago while I was rounding and met him in the ED. I will continue to follow up and encourage his sister Vaughan Basta.

## 2018-01-27 NOTE — Progress Notes (Signed)
   [employer] -  at [facility]   PATIENT NAME: Derrick Marshall    MR#:  [id number]  DATE OF BIRTH:  1981-01-20  SUBJECTIVE:  Started on BiPAP. BecauseREVIEW OF SYSTEMS:  Review of Systems  Unable to perform ROS: Medical condition  Physical examination: Alert but very agitated, head normocephalic, atraumatic Eyes equally reacting to light. Mouth moist mucous membranes.. Cardiovascular S1, S2 regular, tachycardic.   Lungs coarse breath sounds bilaterally. Abdomen soft, bowel sounds present Neurologically patient is awake but very agitated due to underlying Down syndrome, medical and psychological condition. Skin intact, warm and dry Musculoskeletal: No swelling, no clubbing, no edema.   DRUG ALLERGIES:   Allergies  Allergen Reactions  . Celexa [Citalopram Hydrobromide] Other (See Comments)    Aggressive   . Zyprexa [Olanzapine] Other (See Comments)    Aggressive behavior    VITALS:  Blood pressure 126/75, pulse 94, temperature 97.8 F (36.6 C), temperature source Axillary, resp. rate 19, height 5\' 1"  (1.549 m), weight 67.5 kg, SpO2 96 %. PHYSICAL EXAMINATION:   LABORATORY PANEL:  Male CBC Recent Labs  Lab 01/27/18 0204  WBC 7.4  HGB 10.1*  HCT 28.1*  PLT 121*   ------------------------------------------------------------------------------------------------------------------ Chemistries  Recent Labs  Lab 01/22/18 0333  01/26/18 0504 01/27/18 0204  NA 140   < > 140 144  K 4.0   < > 4.6 3.7  CL 107   < > 104 108  CO2 26   < > 30 29  GLUCOSE 113*   < > 244* 154*  BUN 15   < > 20 22*  CREATININE 1.19   < > 0.70 1.02  CALCIUM 8.3*   < > 8.3* 8.3*  MG 2.0   < > 2.2  --   AST 63*  --   --   --   ALT 35  --   --   --   ALKPHOS 93  --   --   --   BILITOT 0.9  --   --   --    < > = values in this interval not displayed.   RADIOLOGY:  No results found. ASSESSMENT AND PLAN:   Acute respiratory failure with hypoxia and hypercapnia-  likely secondary to aspiration pneumonia vs HCAP. Procalcitonin elevated to 0.58. Intubated.,  Extubated . Still having occasional gurgling, hypoxia.  Monitor closely, suction as needed.  Continue BiPAP as needed, continue Unasyn. - Septic shock- likely due to pneumonia. -Improving.,  Continue Unasyn.   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  Lactic acidosis- persistent, likely due to shock, Unasyn to cover aspiration pneumonia Elevated troponin- likely due to demand ischemia. ECHO with EF 55-60%.  Down syndrome with history of aggressive behavior - continue cogentin and valium, Haldol. - psychiatry following  All the records are reviewed and case discussed with Care Management/Social Worker. Management plans discussed with the patient's sister and they are in agreement.  CODE STATUS: Full Code  TOTAL TIME TAKING CARE OF THIS PATIENT: 35 minutes.   More than 50% of the time was spent in counseling/coordination of care: YES  D/C UNCLEAR, DEPENDING ON CLINICAL CONDITION.   Katha Hamming M.D on 01/27/2018 at 1:55 PM  Between 7am to 6pm - Pager - [phone]  After 6pm go to [email or web address] - Therapist, nutritional Hospitalists

## 2018-01-28 ENCOUNTER — Inpatient Hospital Stay: Payer: Medicare Other

## 2018-01-28 LAB — URINALYSIS, ROUTINE W REFLEX MICROSCOPIC
Bacteria, UA: NONE SEEN
Bilirubin Urine: NEGATIVE
GLUCOSE, UA: NEGATIVE mg/dL
Ketones, ur: NEGATIVE mg/dL
Leukocytes, UA: NEGATIVE
Nitrite: NEGATIVE
Protein, ur: 100 mg/dL — AB
RBC / HPF: >50 RBC/hpf — ABNORMAL HIGH (ref 0–5)
SPECIFIC GRAVITY, URINE: 1.026 (ref 1.005–1.030)
Squamous Epithelial / LPF: NONE SEEN (ref 0–5)
pH: 7 (ref 5.0–8.0)

## 2018-01-28 LAB — CBC WITH DIFFERENTIAL/PLATELET
BASOS PCT: 0 %
Basophils Absolute: 0 10*3/uL (ref 0–0.1)
EOS PCT: 1 %
Eosinophils Absolute: 0.1 10*3/uL (ref 0–0.7)
HCT: 27.2 % — ABNORMAL LOW (ref 40.0–52.0)
Hemoglobin: 9.5 g/dL — ABNORMAL LOW (ref 13.0–18.0)
Lymphocytes Relative: 17 %
Lymphs Abs: 1.4 10*3/uL (ref 1.0–3.6)
MCH: 36.8 pg — ABNORMAL HIGH (ref 26.0–34.0)
MCHC: 35.1 g/dL (ref 32.0–36.0)
MCV: 104.9 fL — ABNORMAL HIGH (ref 80.0–100.0)
Monocytes Absolute: 0.7 10*3/uL (ref 0.2–1.0)
Monocytes Relative: 9 %
Neutro Abs: 5.8 10*3/uL (ref 1.4–6.5)
Neutrophils Relative %: 73 %
PLATELETS: 132 10*3/uL — AB (ref 150–440)
RBC: 2.6 MIL/uL — ABNORMAL LOW (ref 4.40–5.90)
RDW: 16.3 % — AB (ref 11.5–14.5)
WBC: 8 10*3/uL (ref 3.8–10.6)

## 2018-01-28 LAB — BASIC METABOLIC PANEL
Anion gap: 5 (ref 5–15)
BUN: 17 mg/dL (ref 6–20)
CO2: 23 mmol/L (ref 22–32)
Calcium: 7.8 mg/dL — ABNORMAL LOW (ref 8.9–10.3)
Chloride: 114 mmol/L — ABNORMAL HIGH (ref 98–111)
Creatinine, Ser: 0.94 mg/dL (ref 0.61–1.24)
Glucose, Bld: 105 mg/dL — ABNORMAL HIGH (ref 70–99)
Potassium: 3.6 mmol/L (ref 3.5–5.1)
SODIUM: 142 mmol/L (ref 135–145)

## 2018-01-28 LAB — GLUCOSE, CAPILLARY
GLUCOSE-CAPILLARY: 106 mg/dL — AB (ref 70–99)
GLUCOSE-CAPILLARY: 97 mg/dL (ref 70–99)

## 2018-01-28 LAB — URINE CULTURE: CULTURE: NO GROWTH

## 2018-01-28 MED ORDER — TOBRAMYCIN 0.3 % OP OINT
TOPICAL_OINTMENT | Freq: Three times a day (TID) | OPHTHALMIC | Status: DC
  Administered 2018-01-28 – 2018-02-01 (×14): via OPHTHALMIC
  Filled 2018-01-28: qty 3.5

## 2018-01-28 NOTE — Progress Notes (Signed)
Physical Therapy Treatment Patient Details Name: Derrick Marshall MRN: [id number] DOB: 09-17-1980 Today's Date: 01/28/2018    History of Present Illness Presented to ER from group home setting due to aggressive behavior, in ER x1 month pending work up and placement.  Complicated by episode of unresponsive episode requiring mechanical intubation (9/28-10/3) and admission (due to PNA vs pulmonary edema).  Admitted for sepsis due to HCAP.  MRI completed during hospitalization significant for punctate ischemic frontal and cerebellar infarcts, also suggestive of anoxic brain injury.    PT Comments    Pt agreeable to PT. Pt required a lot of verbal encouragement to participate. Pt demonstrates reduced ability to follow one-step commands. Pt tolerates supine ther-ex without significant HR inc. All other mobility bed, transfer, and amb HR measures 100-120. Pt fatigues quickly in standing requires mod A, +2 to maintain for >20 sec. Pt had diarrhea during transfer activity to [facility], RN notified. Will continue to progress strength and mobility as able.   Follow Up Recommendations  SNF     Equipment Recommendations  None recommended by PT    Recommendations for Other Services       Precautions / Restrictions Restrictions Weight Bearing Restrictions: No    Mobility  Bed Mobility Overal bed mobility: Needs Assistance Bed Mobility: Supine to Sit;Sit to Supine     Supine to sit: Mod assist Sit to supine: Mod assist;+2 for physical assistance   General bed mobility comments: Supine-to-sit pt manages LE independently, requires assist to maintain trunk control. Sit-to-supine pt required assist for trunk and LEs. Pt fatigued and requires heavy VC to participate. Inconsistantly followed one-step commands t/o  Transfers Overall transfer level: Needs assistance Equipment used: Rolling walker (2 wheeled) Transfers: Sit to/from Stand Sit to Stand: Mod assist;+2 physical assistance         General  transfer comment: Pt has fair anterior translation, trunk flexed in standing. Pt required heavy VC for sequencing and encouragement.  Ambulation/Gait Ambulation/Gait assistance: Mod assist;+2 physical assistance Gait Distance (Feet): 2 Feet Assistive device: Rolling walker (2 wheeled) Gait Pattern/deviations: Step-to pattern Gait velocity: decreased   General Gait Details: Pt has dec foot clearance and wide BOS. Heavy VC used for encouragement to participate.   Stairs             Wheelchair Mobility    Modified Rankin (Stroke Patients Only)       Balance Overall balance assessment: Needs assistance Sitting-balance support: Feet unsupported;No upper extremity supported Sitting balance-Leahy Scale: Good Sitting balance - Comments: Pt demonstrated good trunk control on BSC. Tolerates >2 minutes without LOB.   Standing balance support: Bilateral upper extremity supported Standing balance-Leahy Scale: Poor Standing balance comment: Pt could not stand independently this date d/t fatigue and cognitive status.                            Cognition Arousal/Alertness: Awake/alert Behavior During Therapy: Agitated;Anxious Overall Cognitive Status: Impaired/Different from baseline Area of Impairment: Problem solving;Following commands                       Following Commands: Follows one step commands inconsistently     Problem Solving: Decreased initiation;Slow processing        Exercises Other Exercises Other Exercises: Supine AROM ankle pumps and hip flexion. All ther-ex performed 5-6 reps with VC for encouragement. Other Exercises: Toileting activity at [facility]. Pt required VC for sequencing.    General Comments  Pertinent Vitals/Pain Pain Assessment: No/denies pain    Home Living                      Prior Function            PT Goals (current goals can now be found in the care plan section) Acute Rehab PT Goals Patient  Stated Goal: pt unable to state goals at this time PT Goal Formulation: Patient unable to participate in goal setting Time For Goal Achievement: 02/10/18 Potential to Achieve Goals: Fair Progress towards PT goals: Progressing toward goals    Frequency    Min 2X/week      PT Plan Current plan remains appropriate    Co-evaluation              AM-PAC PT "6 Clicks" Daily Activity  Outcome Measure  Difficulty turning over in bed (including adjusting bedclothes, sheets and blankets)?: Unable Difficulty moving from lying on back to sitting on the side of the bed? : Unable Difficulty sitting down on and standing up from a chair with arms (e.g., wheelchair, bedside commode, etc,.)?: Unable Help needed moving to and from a bed to chair (including a wheelchair)?: A Lot Help needed walking in hospital room?: A Lot Help needed climbing 3-5 steps with a railing? : Total 6 Click Score: 8    End of Session Equipment Utilized During Treatment: Gait belt Activity Tolerance: Treatment limited secondary to agitation Patient left: in bed;with bed alarm set;with nursing/sitter in room;with family/visitor present Nurse Communication: Mobility status PT Visit Diagnosis: Muscle weakness (generalized) (M62.81);Difficulty in walking, not elsewhere classified (R26.2)     Time: 1610-9604 PT Time Calculation (min) (ACUTE ONLY): 32 min  Charges:                        Arvilla Meres, SPT   Arvilla Meres 01/28/2018, 3:53 PM

## 2018-01-28 NOTE — Progress Notes (Signed)
[facility] [place] [facility]   PATIENT NAME: Derrick Marshall    MR#:  [id number]  DATE OF BIRTH:  09-09-1980  SUBJECTIVE:  The patient is off BiPAP, in room air. BecauseREVIEW OF SYSTEMS:  Review of Systems  Unable to perform ROS: Medical condition    DRUG ALLERGIES:   Allergies  Allergen Reactions  . Celexa [Citalopram Hydrobromide] Other (See Comments)    Aggressive   . Zyprexa [Olanzapine] Other (See Comments)    Aggressive behavior    VITALS:  Blood pressure 104/69, pulse (!) 106, temperature 99.5 F (37.5 C), resp. rate 14, height 5\' 1"  (1.549 m), weight 67.5 kg, SpO2 98 %. PHYSICAL EXAMINATION:  Alert but very agitated, head normocephalic, atraumatic Eyes equally reacting to light. Mouth moist mucous membranes.. Cardiovascular S1, S2 regular, tachycardic.   Lungs coarse breath sounds bilaterally. Abdomen soft, bowel sounds present Neurologically patient is awake but very agitated due to underlying Down syndrome, medical and psychological condition. Skin intact, warm and dry Musculoskeletal: No swelling, no clubbing, no edema.  LABORATORY PANEL:  Male CBC Recent Labs  Lab 01/28/18 0526  WBC 8.0  HGB 9.5*  HCT 27.2*  PLT 132*   ------------------------------------------------------------------------------------------------------------------ Chemistries  Recent Labs  Lab 01/22/18 0333  01/26/18 0504  01/28/18 0526  NA 140   < > 140   < > 142  K 4.0   < > 4.6   < > 3.6  CL 107   < > 104   < > 114*  CO2 26   < > 30   < > 23  GLUCOSE 113*   < > 244*   < > 105*  BUN 15   < > 20   < > 17  CREATININE 1.19   < > 0.70   < > 0.94  CALCIUM 8.3*   < > 8.3*   < > 7.8*  MG 2.0   < > 2.2  --   --   AST 63*  --   --   --   --   ALT 35  --   --   --   --   ALKPHOS 93  --   --   --   --   BILITOT 0.9  --   --   --   --    < > = values in this interval not displayed.   RADIOLOGY:  Dg Abd 1 View  Result Date: 01/28/2018 CLINICAL  DATA:  Abdominal pain, Down syndrome EXAM: ABDOMEN - 1 VIEW COMPARISON:  01/21/2018 FINDINGS: Scattered stool throughout colon with gas and stool in rectum. No bowel dilatation or bowel wall thickening. No evidence of obstruction. Gas within stomach. Bones unremarkable. No urinary tract calcifications. IMPRESSION: No acute abnormalities. Electronically Signed   By: Ulyses Southward M.D.   On: 01/28/2018 11:28   ASSESSMENT AND PLAN:   Acute respiratory failure with hypoxia and hypercapnia- likely secondary to aspiration pneumonia vs HCAP. Procalcitonin elevated to 0.58. Intubated.,  Extubated . Off BiPAP as needed, continue Unasyn.  DuoNeb as needed.  - Septic shock- likely due to pneumonia. -Improved.,  Continue Unasyn.   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 Continue aspirin..  Lactic acidosis- likely due to shock, Unasyn to cover aspiration pneumonia, improved.  Elevated troponin- likely due to demand ischemia. ECHO with EF 55-60%.  Down syndrome with history of aggressive behavior - continue cogentin and valium, Haldol. -  psychiatry following  PT evaluation suggest SNF. All the records are reviewed and case discussed with Care Management/Social Worker. Management plans discussed with the patient's sister and they are in agreement.  CODE STATUS: Full Code  TOTAL TIME TAKING CARE OF THIS PATIENT: 33 minutes.   More than 50% of the time was spent in counseling/coordination of care: YES  D/C UNCLEAR, DEPENDING ON CLINICAL CONDITION.   Shaune Pollack M.D on 01/28/2018 at 5:22 PM  Between 7am to 6pm - Pager [phone]  After 6pm go to [email or web address] - Therapist, nutritional Hospitalists

## 2018-01-28 NOTE — Progress Notes (Signed)
PULMONARY / CRITICAL CARE MEDICINE   Name: Derrick Marshall MRN: [id number] DOB: [birth year]    ADMISSION DATE:  12/17/2017  BRIEF SUMMARY: 37 y.o. Male who has been in the ED Behavorial unit at [facility] awaiting placement for approximately a month due to aggressive behavior.  On 9/28 he became unresponsive requiring intubation for airway protection.  CXR with concern for HCAP vs Pulmonary edema.  MAJOR EVENTS/TEST RESULTS: 08/24 Placed in Behavioral unit at [facility] ED, awaiting placement 09/28 found unresponsive, intubated for airway protection, admitted to ICU.  09/29 passed SBT but remained minimally responsive off of sedative infusions 09/28 CT Head: No acute findings 09/29 BLE venous US: No DVT 09/29 Echo: LVEF 55-60%, PASP 30-35 mmHg 10/3 Pt successfully extubated  INDWELLING DEVICES:: ETT 09/28 >>10/3   MICRO DATA: MRSA PCR 09/28 >>NEG Blood 09/28 >>NEG  ANTIMICROBIALS:  Vanc 09/28 >> 09/29 Cefepime 09/28 >> 09/29  SUBJECTIVE:  Patient is complaining of some abdominal pain and constipation today Noted to have a temperature spike  VITAL SIGNS: BP (!) 93/53   Pulse (!) 125   Temp 99.5 F (37.5 C)   Resp (!) 30   Ht 5\' 1"  (1.549 m)   Wt 67.5 kg   SpO2 96%   BMI 28.12 kg/m   HEMODYNAMICS:    VENTILATOR SETTINGS: FiO2 (%):  [40 %] 40 %  INTAKE / OUTPUT: I/O last 3 completed shifts: In: 499.8 [I.V.:101.5; IV Piggyback:398.3] Out: 1985 [Urine:1985]  PHYSICAL EXAMINATION: General: well developed, well nourished, NAD  Neuro: alert, follows commands HEENT: supple, no JVD  Cardiovascular:  Reg, II/IV syst M Lungs: diminished, even, non labored  Abdomen: ND, + BS, no masses, soft Ext: warm, no edema Skin: No lesions noted  LABS:  BMET Recent Labs  Lab 01/26/18 0504 01/27/18 0204 01/28/18 0526  NA 140 144 142  K 4.6 3.7 3.6  CL 104 108 114*  CO2 30 29 23   BUN 20 22* 17  CREATININE 0.70 1.02 0.94  GLUCOSE 244* 154* 105*    Electrolytes Recent Labs   Lab 01/22/18 0333  01/24/18 0505 01/25/18 0512 01/26/18 0504 01/27/18 0204 01/28/18 0526  CALCIUM 8.3*   < > 8.3* 7.7* 8.3* 8.3* 7.8*  MG 2.0  --  1.7 2.1 2.2  --   --   PHOS 2.9  --  2.9  --   --   --   --    < > = values in this interval not displayed.    CBC Recent Labs  Lab 01/26/18 0504 01/27/18 0204 01/28/18 0526  WBC 5.7 7.4 8.0  HGB 11.7* 10.1* 9.5*  HCT 32.8* 28.1* 27.2*  PLT 133* 121* 132*    Coag's Recent Labs  Lab 01/25/18 0512  INR 1.17    Sepsis Markers Recent Labs  Lab 01/21/18 1832 01/21/18 2153 01/22/18 0010 01/23/18 0415 01/24/18 0505  LATICACIDVEN 2.4* 3.7* 3.5*  --   --   PROCALCITON  --  0.58  --  0.48 0.26    ABG Recent Labs  Lab 01/21/18 1822 01/22/18 0454 01/25/18 1143  PHART 7.49* 7.34* 7.46*  PCO2ART 31* 45 41  PO2ART 107 100 120*    Liver Enzymes Recent Labs  Lab 01/21/18 1645 01/22/18 0333  AST 66* 63*  ALT 38 35  ALKPHOS 100 93  BILITOT 0.5 0.9  ALBUMIN 3.4* 3.2*    Cardiac Enzymes Recent Labs  Lab 01/26/18 2109 01/27/18 0204 01/27/18 0826  TROPONINI 0.13* 0.08* 0.06*    Glucose Recent Labs  Lab 01/27/18 0744 01/27/18 1148 01/27/18 1555 01/27/18 1950 01/28/18 0422 01/28/18 0730  GLUCAP 147* 123* 131* 88 106* 97    CXR: minimal edema pattern   ASSESSMENT / PLAN:  PULMONARY A: VDRF. Intubated for depressed LOC: extubated 10/3 P:   Supplemental O2 for dyspnea and/or hypoxia  Scheduled bronchodilator therapy   CARDIOVASCULAR A:  Hypotension Elevated troponin likely demand ischemia secondary to respiratory failure  H/O Tetralogy of Fallot, S/P surgical correction P:  ICU hemodynamic monitoring Norepinephrine as needed to maintain MAP > 65 mmHg US Carotid Bilateral pending   RENAL A:   AKI-resolved  P:   Monitor BMET intermittently Monitor I/Os Correct electrolytes as indicated  GASTROINTESTINAL A:   No acute issues P:   Patient complaining of abdominal pain, will place  on stool softener and obtain KUB   HEMATOLOGIC A:   Mild Thrombocytopenia P:  DVT px: enoxaparin Monitor CBC intermittently Transfuse per usual guidelines  INFECTIOUS A:   Aspiration Pneumonia  P:   Monitor temp, WBC count Micro and abx as above Continue Unasyn stop date 10/4  ENDOCRINE A:   Mild hyperglycemia without hx of DM P:   Monitor glu on chem panels Consider SSI for glu > 160   NEUROLOGIC A:   Acute encephalopathy improved Down's syndrome Autism History of violent behvior P:   Psychiatry consulted appreciate input  Sitter at bedside Per psych recommendations continue haldol and valium  Prn morphine and toradol for pain management  Derrick Kindred, DO

## 2018-01-28 NOTE — Progress Notes (Signed)
Pt has remained alert with confusion at baseline. Pt has remained on RA, SpO2 >90%, lung sounds diminished, NDN. Non sustaining desaturation during sleep. Pt has remained in NSR, ST with fever earlier in the shift->tmax 101.8->APAP given x1. Evening Valium HELD d/t hypotension 89/54 (65). Pt pressure has been labile this shift. Pt is lying comfortably in bed, no agitation or frustration. Pt ate only one bite of dinner, stating he only wanted to rest.  Pt has remained with foley catheter-blood tinged urine. UA sent to lab this afternoon. Nitrate, leukocytes and bacteria NEGATIVE. Could be d/t trauma with foley reinsertion. Pt transferred to [facility] with PT today with x2 assist.  Pt had persistent c/o abd pain earlier in the shift->bowel sounds active in all 4 quadrants, abd soft, tender, lots of gas expressed->abd XRAY ordered. Pt had 2 large BM today. No further c/o abd pain. Sister Bonita Quin) has visited and has been updated at bedside.

## 2018-01-28 NOTE — Plan of Care (Signed)
Patient alert and responsive.  Sitter at bedside.  Continues to focus on having bowel movements.  None this shift.  All meds given and tolerated well.  No c/o discomfort or pain. Will continue to monitor.

## 2018-01-28 NOTE — Progress Notes (Signed)
Bipap is at bedside. Pt is on room air and in no respiratory distress at this time.

## 2018-01-29 LAB — PROCALCITONIN: Procalcitonin: <0.1 ng/mL

## 2018-01-29 MED ORDER — ALUM & MAG HYDROXIDE-SIMETH 200-200-20 MG/5ML PO SUSP
30.0000 mL | Freq: Four times a day (QID) | ORAL | Status: DC | PRN
  Administered 2018-01-29: 30 mL via ORAL
  Filled 2018-01-29: qty 30

## 2018-01-29 NOTE — Plan of Care (Signed)
Patient resting in bed with sitter at bedside.  No acute distress noted. Patient very talkative and interactive.  No behavioral concerns noted.  Urine continues to be red with blood.  Will continue to monitor.

## 2018-01-29 NOTE — Progress Notes (Signed)
Patient kicking blankets off, leg over top of side rail, throwing pillow at sitter, attempting to hit nursing staff, verbally abuse. Sitter notified patients sister.

## 2018-01-29 NOTE — Progress Notes (Signed)
PULMONARY / CRITICAL CARE MEDICINE   Name: Derrick Marshall MRN: [id number] DOB: 1980-07-10    ADMISSION DATE:  12/17/2017  BRIEF SUMMARY: 37 y.o. Male who has been in the ED Behavorial unit at [facility] awaiting placement for approximately a month due to aggressive behavior.  On 9/28 he became unresponsive requiring intubation for airway protection.  CXR with concern for HCAP vs Pulmonary edema.  MAJOR EVENTS/TEST RESULTS: 08/24 Placed in Behavioral unit at [facility] ED, awaiting placement 09/28 found unresponsive, intubated for airway protection, admitted to ICU.  09/29 passed SBT but remained minimally responsive off of sedative infusions 09/28 CT Head: No acute findings 09/29 BLE venous US: No DVT 09/29 Echo: LVEF 55-60%, PASP 30-35 mmHg 10/3 Pt successfully extubated  INDWELLING DEVICES:: ETT 09/28 >>10/3   MICRO DATA: MRSA PCR 09/28 >>NEG Blood 09/28 >>NEG  ANTIMICROBIALS:  Vanc 09/28 >> 09/29 Cefepime 09/28 >> 09/29  SUBJECTIVE: Patient doing better today, had some abdominal pain yesterday, also had a red right eye along with blood in Foley.  Stopped his stool softener, started on tobramycin ointment in the right eye and his bleeding is fully appears to be improving  VITAL SIGNS: BP 101/67   Pulse 96   Temp 99.6 F (37.6 C) (Oral)   Resp 18   Ht 5\' 1"  (1.549 m)   Wt 67.5 kg   SpO2 98%   BMI 28.12 kg/m   HEMODYNAMICS:    VENTILATOR SETTINGS:    INTAKE / OUTPUT: I/O last 3 completed shifts: In: 420 [P.O.:420] Out: 1375 [Urine:1375]  PHYSICAL EXAMINATION: General: well developed, well nourished, NAD  Neuro: alert, follows commands HEENT: supple, no JVD  Cardiovascular:  Reg, II/IV syst M Lungs: diminished, even, non labored  Abdomen: ND, + BS, no masses, soft Ext: warm, no edema Skin: No lesions noted  LABS:  BMET Recent Labs  Lab 01/26/18 0504 01/27/18 0204 01/28/18 0526  NA 140 144 142  K 4.6 3.7 3.6  CL 104 108 114*  CO2 30 29 23   BUN 20 22* 17   CREATININE 0.70 1.02 0.94  GLUCOSE 244* 154* 105*    Electrolytes Recent Labs  Lab 01/24/18 0505 01/25/18 0512 01/26/18 0504 01/27/18 0204 01/28/18 0526  CALCIUM 8.3* 7.7* 8.3* 8.3* 7.8*  MG 1.7 2.1 2.2  --   --   PHOS 2.9  --   --   --   --     CBC Recent Labs  Lab 01/26/18 0504 01/27/18 0204 01/28/18 0526  WBC 5.7 7.4 8.0  HGB 11.7* 10.1* 9.5*  HCT 32.8* 28.1* 27.2*  PLT 133* 121* 132*    Coag's Recent Labs  Lab 01/25/18 0512  INR 1.17    Sepsis Markers Recent Labs  Lab 01/23/18 0415 01/24/18 0505 01/29/18 0617  PROCALCITON 0.48 0.26 <0.10    ABG Recent Labs  Lab 01/25/18 1143  PHART 7.46*  PCO2ART 41  PO2ART 120*    Liver Enzymes No results for input(s): AST, ALT, ALKPHOS, BILITOT, ALBUMIN in the last 168 hours.  Cardiac Enzymes Recent Labs  Lab 01/26/18 2109 01/27/18 0204 01/27/18 0826  TROPONINI 0.13* 0.08* 0.06*    Glucose Recent Labs  Lab 01/27/18 0744 01/27/18 1148 01/27/18 1555 01/27/18 1950 01/28/18 0422 01/28/18 0730  GLUCAP 147* 123* 131* 88 106* 97    CXR: minimal edema pattern   ASSESSMENT / PLAN:  Patient with underlying history of Down syndrome and autism, history of violent behavior.  Has done well on Haldol.  Appreciate psychiatric consultation.  Presently he is doing well with a sitter with no further violent activities.   Complained of abdominal pain.  KUB was negative, stop stool softener and patient states he is feeling better  Patient with conjunctivitis in the right eye.  Started on tobramycin ointment with improved exam today  Hematuria has improved today most likely reflects Foley trauma  Her history of acute renal injury now normalized with last BUN of 17 and creatinine 0.94  Anemia.  Follow-up CBC pending  Mild elevation of troponin at 0.06.  Most likely reflected supply demand ischemia with transient hypotension, EKG reveals right bundle branch block with repolarization abnormalities  Prior  history of intubation, has been successfully extubated for several days now  Stable for floor transfer with sitter    Tora Kindred, DOPatient ID: Derrick Marshall, male   DOB: 01-Jul-1980, 37 y.o.   MRN: [id number]

## 2018-01-29 NOTE — Progress Notes (Signed)
   [facility] -  at [facility]   PATIENT NAME: Derrick Marshall    MR#:  [id number]  DATE OF BIRTH:  Dec 22, 1980  SUBJECTIVE:  The patient is off BiPAP, in room air.  He was agitated this morning but quite later. BecauseREVIEW OF SYSTEMS:  Review of Systems  Unable to perform ROS: Medical condition    DRUG ALLERGIES:   Allergies  Allergen Reactions  . Celexa [Citalopram Hydrobromide] Other (See Comments)    Aggressive   . Zyprexa [Olanzapine] Other (See Comments)    Aggressive behavior    VITALS:  Blood pressure 116/71, pulse 90, temperature 99.6 F (37.6 C), temperature source Oral, resp. rate 13, height 5\' 1"  (1.549 m), weight 67.5 kg, SpO2 94 %. PHYSICAL EXAMINATION:  Alert but very agitated, head normocephalic, atraumatic Eyes equally reacting to light. Mouth moist mucous membranes.. Cardiovascular S1, S2 regular, tachycardic.   Lungs coarse breath sounds bilaterally. Abdomen soft, bowel sounds present Neurologically patient is awake but very agitated due to underlying Down syndrome, medical and psychological condition. Skin intact, warm and dry Musculoskeletal: No swelling, no clubbing, no edema.  LABORATORY PANEL:  Male CBC Recent Labs  Lab 01/28/18 0526  WBC 8.0  HGB 9.5*  HCT 27.2*  PLT 132*   ------------------------------------------------------------------------------------------------------------------ Chemistries  Recent Labs  Lab 01/26/18 0504  01/28/18 0526  NA 140   < > 142  K 4.6   < > 3.6  CL 104   < > 114*  CO2 30   < > 23  GLUCOSE 244*   < > 105*  BUN 20   < > 17  CREATININE 0.70   < > 0.94  CALCIUM 8.3*   < > 7.8*  MG 2.2  --   --    < > = values in this interval not displayed.   RADIOLOGY:  No results found. ASSESSMENT AND PLAN:   Acute respiratory failure with hypoxia and hypercapnia- likely secondary to aspiration pneumonia vs HCAP. Procalcitonin elevated to 0.58. Intubated.,  Extubated . Off BiPAP,  continue Unasyn.  DuoNeb as needed.  - Septic shock- likely due to pneumonia. -Improved.,  He was treated with Unasyn.   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 Continue aspirin.  Lactic acidosis- likely due to shock, He was on unasyn to cover aspiration pneumonia, improved.  Elevated troponin- likely due to demand ischemia. ECHO with EF 55-60%.  Down syndrome with history of aggressive behavior - continue cogentin and valium, Haldol. - psychiatry following  PT evaluation suggest SNF. All the records are reviewed and case discussed with Care Management/Social Worker. Management plans discussed with the patient's sister and they are in agreement.  CODE STATUS: Full Code  TOTAL TIME TAKING CARE OF THIS PATIENT: 25  minutes.   More than 50% of the time was spent in counseling/coordination of care: YES  D/C UNCLEAR, DEPENDING ON CLINICAL CONDITION.   Shaune Pollack M.D on 01/29/2018 at 3:07 PM  Between 7am to 6pm - Pager - [phone]  After 6pm go to [email or web address] - Therapist, nutritional Hospitalists

## 2018-01-30 ENCOUNTER — Inpatient Hospital Stay: Payer: Medicare Other

## 2018-01-30 DIAGNOSIS — N472 Paraphimosis: Secondary | ICD-10-CM

## 2018-01-30 MED ORDER — ATORVASTATIN CALCIUM 20 MG PO TABS
40.0000 mg | ORAL_TABLET | Freq: Every day | ORAL | Status: DC
  Administered 2018-01-30 – 2018-02-01 (×3): 40 mg via ORAL
  Filled 2018-01-30 (×3): qty 2

## 2018-01-30 MED ORDER — IPRATROPIUM-ALBUTEROL 0.5-2.5 (3) MG/3ML IN SOLN: 3.0000 mL | Freq: Three times a day (TID) | RESPIRATORY_TRACT | Status: DC

## 2018-01-30 MED ORDER — INFLUENZA VAC SPLIT QUAD 0.5 ML IM SUSY
0.5000 mL | PREFILLED_SYRINGE | INTRAMUSCULAR | Status: AC
  Administered 2018-01-31: 0.5 mL via INTRAMUSCULAR
  Filled 2018-01-30: qty 0.5

## 2018-01-30 NOTE — Progress Notes (Signed)
Physical Therapy Treatment Patient Details Name: Yancey Pedley MRN: [id number] DOB: 03-02-1981 Today's Date: 01/30/2018    History of Present Illness Presented to ER from group home setting due to aggressive behavior, in ER x1 month pending work up and placement.  Complicated by episode of unresponsive episode requiring mechanical intubation (9/28-10/3) and admission (due to PNA vs pulmonary edema).  Admitted for sepsis due to HCAP.  MRI completed during hospitalization significant for punctate ischemic frontal and cerebellar infarcts, also suggestive of anoxic brain injury.    PT Comments    Upon arrival pt in bed with sister and nursing in room. Pt is in a better mood willing to participate in PT at this time. Pt able to demonstrate bed mobility with no physical assist, however significant VC for motivation from PT and sister to perform tasks. Pt able to complete 5 repeated sit to stands however requires moderate hand held assist +2 and tactile cues to bring pelvis anterior. Pt enthusiastic to complete 100 feet of ambulation however getting fatigued after about 50 feet. Biggest concern during pt ambulation this date was consistent loss of balance posteriorly with no righting strategies utilized, mod assist +2 needed to regain balance. Pt continues to remain most appropriate for STR to progress functional limitations noted during treatment.   Follow Up Recommendations  SNF     Equipment Recommendations  None recommended by PT    Recommendations for Other Services       Precautions / Restrictions Precautions Precautions: Fall Restrictions Weight Bearing Restrictions: No    Mobility  Bed Mobility Overal bed mobility: Needs Assistance Bed Mobility: Supine to Sit;Sit to Supine     Supine to sit: Supervision Sit to supine: Supervision   General bed mobility comments: Pt with good effort able to perform supine to sit and sit to supine without physical assist. Pt consistently listing  posteriorly once sitting requiring occasional min assist to maintain balance.   Transfers Overall transfer level: Needs assistance Equipment used: 2 person hand held assist Transfers: Sit to/from Stand Sit to Stand: Mod assist;Min assist;+2 physical assistance         General transfer comment: Pt needing min to mod assist to bring trunk forward, heavy UE with hand held hold to achieve upright standing. Leaning posteriorly immediately upon standing with intermittent mod assist needed to maintain standing balance.   Ambulation/Gait Ambulation/Gait assistance: Mod assist;+2 physical assistance Gait Distance (Feet): 100 Feet Assistive device: 2 person hand held assist   Gait velocity: decreased   General Gait Details: Pt with excellent effort during ambulation, wide BOS, leaning posteriorly with mod assist to maintain balance, pigeon toe LE positioning, shortened steps and limited DF B   Stairs             Wheelchair Mobility    Modified Rankin (Stroke Patients Only)       Balance Overall balance assessment: Needs assistance Sitting-balance support: Feet unsupported;No upper extremity supported Sitting balance-Leahy Scale: Fair Sitting balance - Comments: Pt intermittently losing balance posteriorly but otherwise tolerates sitting well   Standing balance support: Bilateral upper extremity supported Standing balance-Leahy Scale: Poor Standing balance comment: Cannot maintain standing without hand held assist due to posterior lean                            Cognition Arousal/Alertness: Awake/alert Behavior During Therapy: WFL for tasks assessed/performed Overall Cognitive Status: History of cognitive impairments - at baseline Area of Impairment: Following commands  Following Commands: Follows one step commands consistently     Problem Solving: Slow processing;Requires verbal cues;Requires tactile cues        Exercises  Other Exercises Other Exercises: Supine SLR 1x10 B, VC and tactile cues to perform task occasional active assist needed to promote participation Other Exercises: Hip ABD 1x5 with VC and tactile cues to perform task occasional active assist needed to promote participation Other Exercises: Sit to stand 1x5 with 2 person hand held assist, 1 hip thrust during each standing to promote balance    General Comments General comments (skin integrity, edema, etc.): SpO2 in low to mid 90's t/o session. HR initially in 90's before activity and in 110's during exertion.       Pertinent Vitals/Pain Pain Assessment: No/denies pain    Home Living                      Prior Function            PT Goals (current goals can now be found in the care plan section) Progress towards PT goals: Progressing toward goals    Frequency    Min 2X/week      PT Plan Current plan remains appropriate    Co-evaluation              AM-PAC PT "6 Clicks" Daily Activity  Outcome Measure  Difficulty turning over in bed (including adjusting bedclothes, sheets and blankets)?: A Little Difficulty moving from lying on back to sitting on the side of the bed? : A Little Difficulty sitting down on and standing up from a chair with arms (e.g., wheelchair, bedside commode, etc,.)?: Unable Help needed moving to and from a bed to chair (including a wheelchair)?: A Lot Help needed walking in hospital room?: A Lot Help needed climbing 3-5 steps with a railing? : Total 6 Click Score: 12    End of Session Equipment Utilized During Treatment: Gait belt Activity Tolerance: Patient tolerated treatment well Patient left: in bed;with nursing/sitter in room;with family/visitor present Nurse Communication: Mobility status PT Visit Diagnosis: Muscle weakness (generalized) (M62.81);Difficulty in walking, not elsewhere classified (R26.2)     Time: 1610-9604 PT Time Calculation (min) (ACUTE ONLY): 23  min  Charges:                        Mickel Duhamel, SPT 01/30/2018, 2:54 PM

## 2018-01-30 NOTE — Consult Note (Signed)
Urology Consult  I have been asked to see the patient by Dr. Imogene Burn for evaluation and management of penile swellint.  Chief Complaint: penile swelling  History of Present Illness: Derrick Marshall is a 37 y.o. year old male previously admitted to the ICU with acute respiratory failure requiring intubation likely secondary to pneumonia, septic shock who is now been extubated and transferred to the floor in stable condition.  Urology was consulted today for swelling of the patient's penile foreskin.  The patient did presumably have a Foley catheter in the ICU.  It is since been removed.  Penile swelling of his foreskin was noted today on examination.  Patient is unable to provide any additional details/history.   Past Medical History:  Diagnosis Date  . Autism   . Down syndrome   . Tetralogy of Fallot     Past Surgical History:  Procedure Laterality Date  . TETRALOGY OF FALLOT REPAIR      Home Medications:  Current Meds  Medication Sig  . benztropine (COGENTIN) 1 MG tablet Take 1 mg by mouth 2 (two) times daily.  . bisacodyl (DULCOLAX) 10 MG suppository Place 10 mg rectally as needed for moderate constipation. Insert 1 suppository per rectum if no bowel movements in 48 hours  . chlorhexidine (HIBICLENS) 4 % external liquid Apply 1 application topically once a week. Shower from head to toe weekly  . diazepam (VALIUM) 5 MG tablet Take 1 tablet (5 mg total) by mouth daily at 2 PM.  . docusate sodium (COLACE) 100 MG capsule Take 100 mg by mouth 2 (two) times daily.  . famotidine (PEPCID) 20 MG tablet Take 20 mg by mouth 2 (two) times daily before a meal.  . haloperidol (HALDOL) 1 MG tablet Take 3 mg by mouth 2 (two) times daily. In the morning and at 1630  . miconazole (MICOTIN) 2 % powder Apply topically daily. Apply powder to groin, abdomen and buttocks daily after shower  . Miconazole Nitrate (ALOE VESTA ANTIFUNGAL) 2 % OINT Apply 1 application topically 2 (two) times daily as  needed (itching). Apply to groin area  . Multiple Vitamin (MULTIVITAMIN WITH MINERALS) TABS tablet Take 1 tablet by mouth daily.  . Skin Protectants, Misc. (EUCERIN) cream Apply 1 application topically daily.   . vitamin B-12 (CYANOCOBALAMIN) 500 MCG tablet Take 1,000 mcg by mouth 2 (two) times daily.  . Vitamin D, Ergocalciferol, (DRISDOL) 50000 units CAPS capsule Take 50,000 Units by mouth every 7 (seven) days.    Allergies:  Allergies  Allergen Reactions  . Celexa [Citalopram Hydrobromide] Other (See Comments)    Aggressive   . Zyprexa [Olanzapine] Other (See Comments)    Aggressive behavior     No family history on file.  Social History:  reports that he has never smoked. He has never used smokeless tobacco. He reports that he does not drink alcohol or use drugs.  ROS: Patient was not able to provide review of systems.  Physical Exam:  Vital signs in last 24 hours: Temp:  [98 F (36.7 C)-98.4 F (36.9 C)] 98.2 F (36.8 C) (10/07 0543) Pulse Rate:  [77-88] 86 (10/07 0543) Resp:  [16-20] 20 (10/07 0543) BP: (112-138)/(65-99) 114/70 (10/07 0543) SpO2:  [96 %-100 %] 98 % (10/07 0543) Constitutional:  Alert and oriented, No acute distress.  Stigmata of Down syndrome. HEENT: [place] AT, moist mucus membranes.  Trachea midline, no masses Respiratory: Normal respiratory effort, no increased work of breathing. GI: Abdomen is soft, nontender, nondistended, no  abdominal masses GU: Uncircumcised foreskin with paraphimosis appreciated, easily reduced at bedside.  Bilateral descended testicles. Skin: No rashes, bruises or suspicious lesions Neurologic: Grossly intact, no focal deficits, moving all 4 extremities Psychiatric: Sleepy, sitter at bedside.   Laboratory Data:  Recent Labs    01/28/18 0526  WBC 8.0  HGB 9.5*  HCT 27.2*   Recent Labs    01/28/18 0526  NA 142  K 3.6  CL 114*  CO2 23  GLUCOSE 105*  BUN 17  CREATININE 0.94  CALCIUM 7.8*    Radiologic  Imaging: US Carotid Bilateral  Result Date: 01/30/2018 CLINICAL DATA:  CVA. EXAM: BILATERAL CAROTID DUPLEX ULTRASOUND TECHNIQUE: Wallace Cullens scale imaging, color Doppler and duplex ultrasound were performed of bilateral carotid and vertebral arteries in the neck. COMPARISON:  None. FINDINGS: Criteria: Quantification of carotid stenosis is based on velocity parameters that correlate the residual internal carotid diameter with NASCET-based stenosis levels, using the diameter of the distal internal carotid lumen as the denominator for stenosis measurement. The following velocity measurements were obtained: RIGHT ICA:  90/27 cm/sec CCA:  101/14 cm/sec SYSTOLIC ICA/CCA RATIO:  0.9 ECA:  78 cm/sec LEFT ICA:  71/30 cm/sec CCA:  119/20 cm/sec SYSTOLIC ICA/CCA RATIO:  0.6 ECA:  81 cm/sec RIGHT CAROTID ARTERY: There is no grayscale evidence of significant intimal thickening or atherosclerotic plaque affecting the interrogated portions of the right carotid system. There are no elevated peak systolic velocities within the interrogated course of the right internal carotid artery to suggest a hemodynamically significant stenosis. RIGHT VERTEBRAL ARTERY:  Antegrade flow LEFT CAROTID ARTERY: There is no grayscale evidence of significant intimal thickening or atherosclerotic plaque affecting the interrogated portions of the left carotid system. There are no elevated peak systolic velocities within the interrogated course of the left internal carotid artery to suggest a hemodynamically significant stenosis. LEFT VERTEBRAL ARTERY:  Antegrade flow IMPRESSION: Normal carotid Doppler ultrasound. Electronically Signed   By: Simonne Come M.D.   On: 01/30/2018 08:23    Impression/ Plan: 37 year old male previously admitted to the ICU with a Foley catheter since removed with paraphimosis which was easily reduced. -He is interested foreskin remains in normal anatomic position, reduced so that the head of the penis is not visible -Urology  will sign off, please call with any questions or concerns  01/30/2018, 4:48 PM  Vanna Scotland,  MD

## 2018-01-30 NOTE — Clinical Social Work Note (Signed)
CSW spoke with patient's guardian Reed Pandy [phone] to discuss discharge planning. Bonita Quin states that she understands that patient did not do well with therapy and they are recommending SNF but she would like to continue to pursue [facility] for placement because they are more able to meet his needs. Bonita Quin states that she has spoken with Modena Slater and she is aware of the current situation and she plans to follow up with me. CSW will continue to follow for discharge planning.   Ruthe Mannan MSW, [address] [phone]

## 2018-01-30 NOTE — Progress Notes (Signed)
[employer] - Reading at [facility]   PATIENT NAME: Derrick Marshall    MR#:  [id number]  DATE OF BIRTH:  25-Dec-1980  SUBJECTIVE:  The patient is in room air.  He was agitated this morning.  Penis foreskin swelling. BecauseREVIEW OF SYSTEMS:  Review of Systems  Unable to perform ROS: Medical condition    DRUG ALLERGIES:   Allergies  Allergen Reactions  . Celexa [Citalopram Hydrobromide] Other (See Comments)    Aggressive   . Zyprexa [Olanzapine] Other (See Comments)    Aggressive behavior    VITALS:  Blood pressure 114/70, pulse 86, temperature 98.2 F (36.8 C), temperature source Oral, resp. rate 20, height 5\' 1"  (1.549 m), weight 67.5 kg, SpO2 98 %. PHYSICAL EXAMINATION:  Alert but very agitated, head normocephalic, atraumatic Eyes equally reacting to light. Mouth moist mucous membranes.. Cardiovascular S1, S2 regular, tachycardic.   Lungs coarse breath sounds bilaterally. Abdomen soft, bowel sounds present Neurologically patient is awake but very agitated due to underlying Down syndrome, medical and psychological condition. Skin intact, warm and dry Musculoskeletal: No swelling, no clubbing, no edema.  LABORATORY PANEL:  Male CBC Recent Labs  Lab 01/28/18 0526  WBC 8.0  HGB 9.5*  HCT 27.2*  PLT 132*   ------------------------------------------------------------------------------------------------------------------ Chemistries  Recent Labs  Lab 01/26/18 0504  01/28/18 0526  NA 140   < > 142  K 4.6   < > 3.6  CL 104   < > 114*  CO2 30   < > 23  GLUCOSE 244*   < > 105*  BUN 20   < > 17  CREATININE 0.70   < > 0.94  CALCIUM 8.3*   < > 7.8*  MG 2.2  --   --    < > = values in this interval not displayed.   RADIOLOGY:  US Carotid Bilateral  Result Date: 01/30/2018 CLINICAL DATA:  CVA. EXAM: BILATERAL CAROTID DUPLEX ULTRASOUND TECHNIQUE: Wallace Cullens scale imaging, color Doppler and duplex ultrasound were performed of bilateral carotid and  vertebral arteries in the neck. COMPARISON:  None. FINDINGS: Criteria: Quantification of carotid stenosis is based on velocity parameters that correlate the residual internal carotid diameter with NASCET-based stenosis levels, using the diameter of the distal internal carotid lumen as the denominator for stenosis measurement. The following velocity measurements were obtained: RIGHT ICA:  90/27 cm/sec CCA:  101/14 cm/sec SYSTOLIC ICA/CCA RATIO:  0.9 ECA:  78 cm/sec LEFT ICA:  71/30 cm/sec CCA:  119/20 cm/sec SYSTOLIC ICA/CCA RATIO:  0.6 ECA:  81 cm/sec RIGHT CAROTID ARTERY: There is no grayscale evidence of significant intimal thickening or atherosclerotic plaque affecting the interrogated portions of the right carotid system. There are no elevated peak systolic velocities within the interrogated course of the right internal carotid artery to suggest a hemodynamically significant stenosis. RIGHT VERTEBRAL ARTERY:  Antegrade flow LEFT CAROTID ARTERY: There is no grayscale evidence of significant intimal thickening or atherosclerotic plaque affecting the interrogated portions of the left carotid system. There are no elevated peak systolic velocities within the interrogated course of the left internal carotid artery to suggest a hemodynamically significant stenosis. LEFT VERTEBRAL ARTERY:  Antegrade flow IMPRESSION: Normal carotid Doppler ultrasound. Electronically Signed   By: Simonne Come M.D.   On: 01/30/2018 08:23   ASSESSMENT AND PLAN:   Acute respiratory failure with hypoxia and hypercapnia- likely secondary to aspiration pneumonia vs HCAP. Procalcitonin elevated to 0.58. Intubated.,  Extubated . Off BiPAP, continue Unasyn.  DuoNeb as needed.  -  Septic shock- likely due to pneumonia. -Improved.,  He was treated with Unasyn.   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 Continue aspirin.  Lactic acidosis- likely due to shock, He was on unasyn to  cover aspiration pneumonia, improved.  Elevated troponin- likely due to demand ischemia. ECHO with EF 55-60%.  Down syndrome with history of aggressive behavior - continue cogentin and valium, Haldol. - psychiatry following  Penis foreskin swelling.  Urology consult.  Continue PT. The patient is guarded, her sister preferred the patient to be placed to [facility]. Hopefully discharge to [facility] with PT. All the records are reviewed and case discussed with Care Management/Social Worker. Management plans discussed with the patient's sister and they are in agreement.  CODE STATUS: Full Code  TOTAL TIME TAKING CARE OF THIS PATIENT: 33  minutes.   More than 50% of the time was spent in counseling/coordination of care: YES  D/C UNCLEAR, DEPENDING ON CLINICAL CONDITION.   Shaune Pollack M.D on 01/30/2018 at 3:26 PM  Between 7am to 6pm - Pager - [phone]  After 6pm go to [email or web address] - Therapist, nutritional Hospitalists

## 2018-01-31 LAB — CREATININE, SERUM
CREATININE: 0.77 mg/dL (ref 0.61–1.24)
GFR calc Af Amer: >60 mL/min (ref >60)
GFR calc non Af Amer: >60 mL/min (ref >60)

## 2018-01-31 MED ORDER — DIAZEPAM 5 MG PO TABS: 5.0000 mg | ORAL_TABLET | Freq: Every evening | ORAL | 0 refills | Status: DC

## 2018-01-31 MED ORDER — LORAZEPAM 2 MG/ML IJ SOLN
0.5000 mg | Freq: Once | INTRAMUSCULAR | Status: DC | PRN
  Filled 2018-01-31: qty 1

## 2018-01-31 MED ORDER — ATORVASTATIN CALCIUM 40 MG PO TABS: 40.0000 mg | ORAL_TABLET | Freq: Every day | ORAL | 2 refills | Status: AC

## 2018-01-31 MED ORDER — ASPIRIN 81 MG PO CHEW: 324.0000 mg | CHEWABLE_TABLET | Freq: Every day | ORAL | 2 refills | Status: DC

## 2018-01-31 MED ORDER — IPRATROPIUM-ALBUTEROL 0.5-2.5 (3) MG/3ML IN SOLN: 3.0000 mL | Freq: Four times a day (QID) | RESPIRATORY_TRACT | Status: DC | PRN

## 2018-01-31 NOTE — [place] FL2 (Addendum)
[place] MEDICAID FL2 LEVEL OF CARE SCREENING TOOL     IDENTIFICATION  Patient Name: Derrick Marshall Birthdate: [birth year] Sex: male Admission Date (Current Location): 12/17/2017  [place] and [place] Number:  Chiropodist and Address:  [facility], [address] [place], [place] [address]      Provider Number: [id number]  Attending Physician Name and Address:  Shaune Pollack, MD  Relative Name and Phone Number:  Reed Pandy- guardian [phone]    Current Level of Care: Hospital Recommended Level of Care: Skilled Nursing Facility Prior Approval Number:    Date Approved/Denied:   PASRR Number: Pending   Discharge Plan: SNF    Current Diagnoses: Patient Active Problem List   Diagnosis Date Noted  . Paraphimosis   . Sepsis (HCC) 01/21/2018  . Autistic spectrum disorder 12/12/2017  . 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 Agitation 12/12/2017    Orientation RESPIRATION BLADDER Height & Weight     Self, Place  Normal Continent Weight: 148 lb 13 oz (67.5 kg) Height:  5\' 1"  (154.9 cm)  BEHAVIORAL SYMPTOMS/MOOD NEUROLOGICAL BOWEL NUTRITION STATUS  Verbally abusive, Physically abusive (none) Continent Diet(Dysphagia 1 )  AMBULATORY STATUS COMMUNICATION OF NEEDS Skin   Extensive Assist Verbally Normal                       Personal Care Assistance Level of Assistance  Feeding, Bathing, Dressing Bathing Assistance: Limited assistance Feeding assistance: Independent Dressing Assistance: Limited assistance     Functional Limitations Info  Sight, Hearing, Speech Sight Info: Adequate Hearing Info: Adequate Speech Info: Adequate    SPECIAL CARE FACTORS FREQUENCY  PT (By licensed PT), OT (By licensed OT)     PT Frequency: 5 OT Frequency: 5            Contractures Contractures Info: Not present    Additional Factors Info  Code Status, Allergies Code Status Info: Full Code  Allergies Info:  Celexa  Citalopram Hydrobromide, Zyprexa Olanzapine           Current Medications (01/31/2018):  This is the current hospital active medication list Current Facility-Administered Medications  Medication Dose Route Frequency Provider Last Rate Last Dose  . 0.9 %  sodium chloride infusion   Intravenous PRN Tukov-Yual, Magdalene S, NP 5 mL/hr at 01/25/18 0731 250 mL at 01/25/18 0731  . acetaminophen (TYLENOL) tablet 650 mg  650 mg Oral Q6H PRN Eugenie Norrie, NP   650 mg at 01/28/18 0852  . alum & mag hydroxide-simeth (MAALOX/MYLANTA) 200-200-20 MG/5ML suspension 30 mL  30 mL Oral Q6H PRN Shaune Pollack, MD   30 mL at 01/29/18 2306  . aspirin chewable tablet 324 mg  324 mg Oral Daily Eugenie Norrie, NP   324 mg at 01/30/18 0948  . atorvastatin (LIPITOR) tablet 40 mg  40 mg Oral q1800 Shaune Pollack, MD   40 mg at 01/30/18 1836  . benztropine (COGENTIN) tablet 0.5 mg  0.5 mg Oral BID Clapacs, Jackquline Denmark, MD   0.5 mg at 01/30/18 2202  . bisacodyl (DULCOLAX) suppository 10 mg  10 mg Rectal Daily PRN Tukov-Yual, Magdalene S, NP   10 mg at 01/22/18 0358  . chlorhexidine (PERIDEX) 0.12 % solution 15 mL  15 mL Mouth Rinse BID Conforti, John, DO   15 mL at 01/30/18 2202  . diazepam (VALIUM) tablet 10 mg  10 mg Oral Q8H PRN Mayo, Allyn Kenner, MD   10 mg at 01/31/18 0151  .  diazepam (VALIUM) tablet 5 mg  5 mg Oral QPM Clapacs, Jackquline Denmark, MD   5 mg at 01/30/18 1837  . docusate (COLACE) 50 MG/5ML liquid 100 mg  100 mg Per Tube BID PRN Harlon Ditty D, NP      . enoxaparin (LOVENOX) injection 40 mg  40 mg Subcutaneous Q24H Kasa, Kurian, MD   40 mg at 01/30/18 2202  . haloperidol (HALDOL) tablet 3 mg  3 mg Oral BID Clapacs, John T, MD   3 mg at 01/30/18 2201  . haloperidol lactate (HALDOL) injection 1 mg  1 mg Intravenous Q6H PRN Merwyn Katos, MD   1 mg at 01/30/18 2358  . Influenza vac split quadrivalent PF (FLUARIX) injection 0.5 mL  0.5 mL Intramuscular Tomorrow-1000 Shaune Pollack, MD      . ipratropium-albuterol (DUONEB)  0.5-2.5 (3) MG/3ML nebulizer solution 3 mL  3 mL Nebulization Q6H PRN Shaune Pollack, MD      . ketorolac (TORADOL) 15 MG/ML injection 15 mg  15 mg Intravenous Q6H PRN Eugenie Norrie, NP   15 mg at 01/30/18 1704  . ketotifen (ZADITOR) 0.025 % ophthalmic solution 1 drop  1 drop Both Eyes BID Erin Fulling, MD   1 drop at 01/30/18 2206  . LORazepam (ATIVAN) injection 0.5 mg  0.5 mg Intravenous Once PRN Oralia Manis, MD      . MEDLINE mouth rinse  15 mL Mouth Rinse q12n4p Conforti, John, DO   15 mL at 01/30/18 1430  . morphine 2 MG/ML injection 2 mg  2 mg Intravenous Q3H PRN Harlon Ditty D, NP      . sodium chloride 0.9 % bolus 500 mL  500 mL Intravenous Once Harlon Ditty D, NP      . tobramycin (TOBREX) 0.3 % ophthalmic ointment   Right Eye TID Conforti, John, DO      . vitamin B-12 (CYANOCOBALAMIN) tablet 100 mcg  100 mcg Oral Daily Eugenie Norrie, NP   100 mcg at 01/30/18 1610     Discharge Medications: Please see discharge summary for a list of discharge medications.  Relevant Imaging Results:  Relevant Lab Results:   Additional Information SSN: [id number]  Ruthe Mannan, [place]

## 2018-01-31 NOTE — Progress Notes (Signed)
   01/31/18 1130  Clinical Encounter Type  Visited With Patient  Visit Type Follow-up;Spiritual support  Referral From Nurse  Consult/Referral To Chaplain  Spiritual Encounters  Spiritual Needs Prayer;Emotional   CH followed up with Derrick Marshall. This CH was on-call when the patient was moved to ICU several days ago. The patient was calm and seemed happy to see me. I encouraged [name] to be good and I will see him again soon. I will follow up as needed.

## 2018-01-31 NOTE — Care Management Important Message (Signed)
Important Message  Patient Details  Name: Derrick Marshall MRN: [id number] Date of Birth: 14-Jul-1980   Medicare Important Message Given:  Yes  Signed by legal guardian, Reed Pandy.  Olegario Messier A Tauheed Mcfayden 01/31/2018, 11:07 AM

## 2018-01-31 NOTE — Progress Notes (Addendum)
Speech Language Pathology Treatment: Dysphagia  Patient Details Name: Derrick Marshall MRN: [id number] DOB: 04/13/1981 Today's Date: 01/31/2018 Time: 4098-1191 SLP Time Calculation (min) (ACUTE ONLY): 29 min  Assessment / Plan / Recommendation Clinical Impression  Pt seen for ongoing assessment of swallowing function and toleration of oral diet. Pt has been transferred out of the CCU to the floor; he is awake and lying in bed engaging w/ visitors and family in room. Per NSG and chart notes, pt has been tolerating his current diet of puree and Nectar consistency liquids. Pt does have a baseline of Autism and Down Syndrome w/ Cognitive decline; impulsive behavior. Pt has also been agitated and aggressive during shifts requiring Medications per NSG report.  Pt was agreeable to po trials of thin liquids w/ SLP. Pt positioned more upright w/ pillows behind back for support. SLP offered trials of thin liquids via straw (likes to use) which he consumed ~4-5 ozs total w/ no immediate, overt s/s of aspiration noted. Clear vocal quality post trials; no overt decline in respiratory status post trials. However, pt did exhibit MODERATE Belching post trials - any retrograde activity or backflow of bolus material during Belching can increase risk for aspiration of such if it moves into the airway. Noted pt drank quickly w/ consecutive sips - encouraged pt to slow down and drink more slowly, taking breaks when drinking. Suspect impulsive behavior is premorbid behavior. This too can increase risk for choking, aspiration.  Pt would benefit from monitoring during oral intake and meals to help control for impulsive eating/drinking behaviors; follow through w/ aspiration precautions during meals including reducing distractions during meals. Recommend continue w/ the pureed diet consistency w/ upgrade in liquid consistency to thin liquids today; continued monitoring for toleration next 1-2 days. ST services to f/u w/ trials of  minced foods in his diet for further diet upgrade next 2-3 days. NSG updated; agreed.    HPI HPI: Pt is a 37 y/o male w/ baseline of Autism and Down Syndrome w/ report of Aggressive behavior. He resides at a Group Home. Pt presented to ER from [facility] setting due to aggressive behavior, in ER x1 month pending work up and placement to [facility].  Complicated by episode of unresponsive episode requiring mechanical intubation (9/28-10/3) and admission (work-up including recent labs showed elevated lactic acid levels, urine toxicology positive for benzodiazepine, abnormal blood gas showing respiratory alkalosis.  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  Admitted for sepsis due to HCAP.  Pt has a Comptroller present at all times. Pt is verbally conversive w/ mumbled/muttered speech, dysarthric speech. Per Neurologist's note, pt appears at baseline Cognitively; Right esotropia noted.       SLP Plan  Continue with current plan of care       Recommendations  Diet recommendations: Dysphagia 1 (puree);Thin liquid Liquids provided via: Cup;Straw(monitor) Medication Administration: Crushed with puree(as able or in liquid form) Supervision: Patient able to self feed;Staff to assist with self feeding;Full supervision/cueing for compensatory strategies Compensations: Minimize environmental distractions;Slow rate;Small sips/bites;Lingual sweep for clearance of pocketing;Multiple dry swallows after each bite/sip;Follow solids with liquid Postural Changes and/or Swallow Maneuvers: Seated upright 90 degrees;Upright 30-60 min after meal  General recommendations:  (Dietician f/u) Oral Care Recommendations: Oral care BID;Staff/trained caregiver to provide oral care Follow up Recommendations: Home health SLP(TBD for education) SLP Visit Diagnosis: Dysphagia, oropharyngeal phase (R13.12)(declined Cognitive status baseline) Plan: Continue with current plan of care       GO                Jerilynn Som, MS, CCC-SLP Annleigh Knueppel 01/31/2018, 11:54 AM

## 2018-01-31 NOTE — Progress Notes (Signed)
PT Cancellation Note  Patient Details Name: Derrick Marshall MRN: [id number] DOB: 1980/10/09   Cancelled Treatment:    Reason Eval/Treat Not Completed: Fatigue/lethargy limiting ability to participate Pt sleeping soundly in bed, sitter states that he had been sleeping for a while.  Initially pt woke to his name, acknowledged SPT but went quickly back to sleep.  Attempted to wake him/encourage participation/walking.  He again briefly woke, grunted that he was not interested and went back to sleep.  Will try back at a later date.    Malachi Pro, DPT 01/31/2018, 4:45 PM

## 2018-01-31 NOTE — Progress Notes (Signed)
   [employer] [facility] [place] at [facility]   PATIENT NAME: Derrick Marshall    MR#:  [id number]  DATE OF BIRTH:  Jun 13, 1980  SUBJECTIVE:  The patient is in room air.  He was agitated this morning.  Penis foreskin swelling. BecauseREVIEW OF SYSTEMS:  Review of Systems  Unable to perform ROS: Medical condition    DRUG ALLERGIES:   Allergies  Allergen Reactions  . Celexa [Citalopram Hydrobromide] Other (See Comments)    Aggressive   . Zyprexa [Olanzapine] Other (See Comments)    Aggressive behavior    VITALS:  Blood pressure 108/74, pulse 80, temperature 98.6 F (37 C), temperature source Oral, resp. rate 15, height 5\' 1"  (1.549 m), weight 67.5 kg, SpO2 98 %. PHYSICAL EXAMINATION:  Alert but very agitated, head normocephalic, atraumatic Eyes equally reacting to light. Mouth moist mucous membranes.. Cardiovascular S1, S2 regular, tachycardic.   Lungs coarse breath sounds bilaterally. Abdomen soft, bowel sounds present Neurologically patient is awake but very agitated due to underlying Down syndrome, medical and psychological condition. Skin intact, warm and dry Musculoskeletal: No swelling, no clubbing, no edema.  LABORATORY PANEL:  Male CBC Recent Labs  Lab 01/28/18 0526  WBC 8.0  HGB 9.5*  HCT 27.2*  PLT 132*   ------------------------------------------------------------------------------------------------------------------ Chemistries  Recent Labs  Lab 01/26/18 0504  01/28/18 0526 01/31/18 0539  NA 140   < > 142  --   K 4.6   < > 3.6  --   CL 104   < > 114*  --   CO2 30   < > 23  --   GLUCOSE 244*   < > 105*  --   BUN 20   < > 17  --   CREATININE 0.70   < > 0.94 0.77  CALCIUM 8.3*   < > 7.8*  --   MG 2.2  --   --   --    < > = values in this interval not displayed.   RADIOLOGY:  No results found. ASSESSMENT AND PLAN:   Acute respiratory failure with hypoxia and hypercapnia- likely secondary to aspiration pneumonia vs HCAP. Procalcitonin  elevated to 0.58. Intubated.,  Extubated . Off BiPAP, continue Unasyn.  DuoNeb as needed.  - Septic shock- likely due to pneumonia. -Improved.,  He was treated with Unasyn.   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 Continue aspirin.  Lactic acidosis- likely due to shock, He was on unasyn to cover aspiration pneumonia, improved.  Elevated troponin- likely due to demand ischemia. ECHO with EF 55-60%.  Down syndrome with history of aggressive behavior - continue cogentin and valium, Haldol. - psychiatry following  Penis foreskin swelling.  Paraphimosis per Dr. Apolinar Junes.  Continue PT. The patient is guarded, her sister preferred the patient to be placed to [facility]. Hopefully discharge to [facility] with PT. All the records are reviewed and case discussed with Care Management/Social Worker. Management plans discussed with the patient's sister and they are in agreement.  CODE STATUS: Full Code  TOTAL TIME TAKING CARE OF THIS PATIENT: 23  minutes.   More than 50% of the time was spent in counseling/coordination of care: YES  D/C UNCLEAR, DEPENDING ON CLINICAL CONDITION.   Shaune Pollack M.D on 01/31/2018 at 9:33 AM  Between 7am to 6pm - Pager - [phone]  After 6pm go to [email or web address] - Therapist, nutritional Hospitalists

## 2018-01-31 NOTE — Progress Notes (Signed)
Patient became aggressive this shift , verbally abuse and  throwing things in room,  PRN haldol IV given with little improvement, PRN  PO valium given, patient calmed down after 45 min, will continue to monitor.

## 2018-01-31 NOTE — Clinical Social Work Note (Signed)
CSW spoke with patient's guardian Reed Pandy at bedside. Bonita Quin was accompanied by Modena Slater from [facility]. CSW explained that PT is still recommending SNF for patient due to weakness and decreased independence. Guardian states that they will not be sending patient to SNF because "they do not know how to handle down's syndrome" CSW explained that we need to have a discharge disposition soon because patient is medically stable. Guardian states that she does not agree with this and patient would not be going anywhere. Guardian requested patient's medical record. CSW explained that she would need to request that from medical records. Noreene Larsson states that she needs specific documentation to send to [facility] for respite care. Noreene Larsson states that she plans to do the application for Felicity Pellegrini today because she will be on vacation for the next couple of weeks. CSW provided requested documents for Noreene Larsson in order to help place patient. CSW again reminded Noreene Larsson and Bonita Quin that patient is medically stable and could not be held in the hospital. Both Noreene Larsson and Bonita Quin state that they would have more information about discharge today. CSW will follow up with Noreene Larsson to determine discharge disposition. CSW also discussed case with CSW Chiropodist. CSW will continue to follow for discharge planning.   Ruthe Mannan MSW, [address] [phone]

## 2018-02-01 NOTE — Progress Notes (Signed)
Clinical Child psychotherapist (CSW) attempted to contact Noreene Larsson patient's [employer] Care Coordinator to get an update on the [place] application however she did not answer and her voicemail was full.   [name], LCSW [phone]

## 2018-02-01 NOTE — Progress Notes (Signed)
Clinical Child psychotherapist (CSW) received a call back from Derrick Marshall stating that she has made arrangements for patient to go to [place] in [place] tomorrow at 10 am. Per Derrick Marshall is very familiar with patient he was there for 1 year prior to going to [facility] group home. Per Derrick Marshall understands that patient has limited function and is willing to accept him. Derrick Marshall asked if the hospital could transport. CSW made Derrick Marshall aware that EMS will not be covered because patient is ambulating. CSW explained that patient's sister/ guardian can pay for [facility] transport however Derrick Marshall stated the guardian can't afford that. CSW asked where patient's disability check is going and per Derrick Marshall it is still with [name] in the process of getting changed over. Per Derrick Marshall she will contact patient's sister/ guardian to discuss transport and will call CSW back.   [name], LCSW [phone]

## 2018-02-01 NOTE — Discharge Planning (Signed)
Pt needs to be ready for dc tom morning no later than 0830. His sister will be transporting him to [place] at [place]. 1000 is the latest that he can be there. I will relay this info to oncoming nurse and charge nurse tonight at 1900. Torrie Mayers RN

## 2018-02-01 NOTE — Progress Notes (Signed)
Chaplain responded to a security code. Chaplain supported staff as Pt was occupied with security.    02/01/18 2300  Clinical Encounter Type  Visited With Patient  Visit Type Code  Referral From Nurse  Spiritual Encounters  Spiritual Needs Emotional

## 2018-02-01 NOTE — Progress Notes (Signed)
Plan is for patient to discharge to [facility] tomorrow. Patient's guardian/ sister will pick patient up at 8:30 am because patient has to be at [facility] by 10 am. Per sister she will bring her boyfriend to assist with the transport. Per Derrick Marshall patient's [facility] needs D/C summary and all the PT notes. CSW prepared D/C packet today and will add D/C summary tomorrow. CSW contacted MD and RN and made them aware that patient has to be ready by 8:30 am tomorrow. [facility] screener contacted CSW and was made aware of above. Per screener [facility] will be cancelled. CSW will continue to follow and assist as needed.   [name], LCSW [phone]

## 2018-02-01 NOTE — Progress Notes (Signed)
PT Cancellation Note  Patient Details Name: Derrick Marshall MRN: [id number] DOB: 11-13-1980   Cancelled Treatment:     Reason Treat Not Completed: Fatigue/lethargy, limiting ability to participate. Pt sleeping in bed, NT indicating he had been sleeping for 3 hours but did not sleep well last night. Pt initially waking up to name and acknowledging SPT however indicating he does not want to participate in therapy. Will try back at later date.    Mickel Duhamel, SPT  02/01/2018, 3:37 PM

## 2018-02-01 NOTE — Progress Notes (Signed)
Occupational Therapy Treatment Patient Details Name: Derrick Marshall MRN: [id number] DOB: Nov 05, 1980 Today's Date: 02/01/2018    History of present illness Pt. is a 37 y.o. male who presented to ER from group home setting due to aggressive behavior, in ER x1 month pending work up and placement.  Complicated by episode of unresponsive episode requiring mechanical intubation (9/28-10/3) and admission (due to PNA vs pulmonary edema).  Admitted for sepsis due to HCAP.  MRI completed during hospitalization significant for punctate ischemic frontal and cerebellar infarcts, also suggestive of anoxic brain injury.   OT comments  Pt. was alert, and pleasant during the session this a.m. Pt. worked on self grooming tasks for washing his face, and combing hair. Pt. worked on reaching towards targets crossing midline. Pt. worked on active AROM, and AAROM in the BUEs  For shoulder flexion, abduction, elbow flexion, extension, wrist flexion, and extension. Pt. required verbal cues, and cognitive assist. Pt. continues benefit from OT services for ADL training, A/E training, and pt. education caregiver education about ROM, and ADL functioning.   Follow Up Recommendations  SNF    Equipment Recommendations       Recommendations for Other Services      Precautions / Restrictions Precautions Precautions: Fall Restrictions Weight Bearing Restrictions: No                                                     ADL either performed or assessed with clinical judgement   ADL Overall ADL's : At baseline     Grooming: Wash/dry face;Set up;Brushing hair;Bed level Grooming Details (indicate cue type and reason): Pt. was able to was his whole face thoroughly.                                      Vision   Additional Comments: Pt. has decreased visual tracking, and inward gaze of right eye.   Perception     Praxis      Cognition Arousal/Alertness: Awake/alert Behavior  During Therapy: WFL for tasks assessed/performed Overall Cognitive Status: History of cognitive impairments - at baseline Area of Impairment: Following commands                       Following Commands: Follows one step commands consistently     Problem Solving: Slow processing;Requires verbal cues;Requires tactile cues          Exercises     Shoulder Instructions       General Comments      Pertinent Vitals/ Pain          Home Living                                          Prior Functioning/Environment              Frequency           Progress Toward Goals  OT Goals(current goals can now be found in the care plan section)     Acute Rehab OT Goals Patient Stated Goal: Pt unable to state goals at this time  Plan Discharge plan remains appropriate    Co-evaluation  AM-PAC PT "6 Clicks" Daily Activity     Outcome Measure   Help from another person eating meals?: A Little Help from another person taking care of personal grooming?: A Lot Help from another person toileting, which includes using toliet, bedpan, or urinal?: A Lot Help from another person bathing (including washing, rinsing, drying)?: A Lot Help from another person to put on and taking off regular upper body clothing?: A Lot Help from another person to put on and taking off regular lower body clothing?: A Lot 6 Click Score: 13    End of Session    OT Visit Diagnosis: Muscle weakness (generalized) (M62.81)   Activity Tolerance Treatment limited secondary to agitation   Patient Left in bed;with call bell/phone within reach;with nursing/sitter in room;Other (comment)   Nurse Communication          Time: 1610-9604 OT Time Calculation (min): 25 min  Charges: OT General Charges $OT Visit: 1 Visit OT Treatments $Self Care/Home Management : 23-37 mins  Olegario Messier, MS, OTR/L  Olegario Messier, MS, OTR/L 02/01/2018, 2:07  PM

## 2018-02-01 NOTE — Progress Notes (Signed)
   [employer] - [place] at [facility]   PATIENT NAME: Derrick Marshall    MR#:  [id number]  DATE OF BIRTH:  29-Mar-1981  SUBJECTIVE:  The patient is in room air.  He was agitated this morning.  Penis foreskin swelling. BecauseREVIEW OF SYSTEMS:  Review of Systems  Unable to perform ROS: Medical condition    DRUG ALLERGIES:   Allergies  Allergen Reactions  . Celexa [Citalopram Hydrobromide] Other (See Comments)    Aggressive   . Zyprexa [Olanzapine] Other (See Comments)    Aggressive behavior    VITALS:  Blood pressure 97/68, pulse 75, temperature 98.7 F (37.1 C), temperature source Oral, resp. rate 20, height 5\' 1"  (1.549 m), weight 67.5 kg, SpO2 97 %. PHYSICAL EXAMINATION:  Alert but very agitated, head normocephalic, atraumatic Eyes equally reacting to light. Mouth moist mucous membranes.. Cardiovascular S1, S2 regular, tachycardic.   Lungs coarse breath sounds bilaterally. Abdomen soft, bowel sounds present Neurologically patient is awake but very agitated due to underlying Down syndrome, medical and psychological condition. Skin intact, warm and dry Musculoskeletal: No swelling, no clubbing, no edema.  LABORATORY PANEL:  Male CBC Recent Labs  Lab 01/28/18 0526  WBC 8.0  HGB 9.5*  HCT 27.2*  PLT 132*   ------------------------------------------------------------------------------------------------------------------ Chemistries  Recent Labs  Lab 01/26/18 0504  01/28/18 0526 01/31/18 0539  NA 140   < > 142  --   K 4.6   < > 3.6  --   CL 104   < > 114*  --   CO2 30   < > 23  --   GLUCOSE 244*   < > 105*  --   BUN 20   < > 17  --   CREATININE 0.70   < > 0.94 0.77  CALCIUM 8.3*   < > 7.8*  --   MG 2.2  --   --   --    < > = values in this interval not displayed.   RADIOLOGY:  No results found. ASSESSMENT AND PLAN:   Acute respiratory failure with hypoxia and hypercapnia- likely secondary to aspiration pneumonia vs HCAP.  Procalcitonin elevated to 0.58. Intubated.,  Extubated . Off BiPAP, continue Unasyn.  DuoNeb as needed.  - Septic shock- likely due to pneumonia. -Improved.,  He was treated with Unasyn.   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 Continue aspirin.  Lactic acidosis- likely due to shock, He was on unasyn to cover aspiration pneumonia, improved.  Elevated troponin- likely due to demand ischemia. ECHO with EF 55-60%.  Down syndrome with history of aggressive behavior - continue cogentin and valium, Haldol.  Penis foreskin swelling.  Paraphimosis per Dr. Apolinar Junes.  Continue PT. The patient is guarded, her sister preferred the patient to be placed to [facility]. Hopefully discharge to [facility] tomorrow. All the records are reviewed and case discussed with Care Management/Social Worker. Management plans discussed with the patient's sister and they are in agreement.  CODE STATUS: Full Code  TOTAL TIME TAKING CARE OF THIS PATIENT: 22  minutes.   More than 50% of the time was spent in counseling/coordination of care: YES  D/C tomorrow, DEPENDING ON CLINICAL CONDITION.   Shaune Pollack M.D on 02/01/2018 at 4:24 PM  Between 7am to 6pm - Pager - [phone]  After 6pm go to [email or web address] - Therapist, nutritional Hospitalists

## 2018-02-01 NOTE — Discharge Summary (Signed)
[facility] - [place] at [facility]   PATIENT NAME: Derrick Marshall    MR#:  [id number]  DATE OF BIRTH:  05/15/1980  DATE OF ADMISSION:  12/17/2017   ADMITTING PHYSICIAN: Campbell Stall, MD  DATE OF DISCHARGE: 02/02/2018 PRIMARY CARE PHYSICIAN: Margaretann Loveless, MD   ADMISSION DIAGNOSIS:  Violent behavior [R45.6] Hypotension, unspecified hypotension type [I95.9] Altered mental status, unspecified altered mental status type [R41.82] DISCHARGE DIAGNOSIS:  Principal Problem:   Autistic spectrum disorder Active Problems:   Moderate intellectual disability   Agitation   Sepsis (HCC)   Paraphimosis  SECONDARY DIAGNOSIS:   Past Medical History:  Diagnosis Date  . Autism   . Down syndrome   . Tetralogy of Fallot    HOSPITAL COURSE:  Acute respiratory failure with hypoxia and hypercapnia- likely secondary to aspiration pneumonia vs HCAP. Procalcitonin elevated to 0.58. Intubated.,  Extubated . Off BiPAP, continue Unasyn.  DuoNeb as needed.  - Septic shock- likely due to pneumonia. -Improved.,  He was treated with Unasyn.   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 Continue aspirin.  Lactic acidosis- likely due to shock, He was on unasyn to cover aspiration pneumonia, improved.  Elevated troponin- likely due to demand ischemia. ECHO with EF 55-60%.  Down syndrome with history of aggressive behavior - continue cogentin and valium, Haldol.  Penis foreskin swelling.  Paraphimosis per Dr. Apolinar Junes.  Continue PT. The patient is guarded, her sister preferred the patient to be placed to [facility]. DISCHARGE CONDITIONS:   CONSULTS OBTAINED:  Treatment Team:  Clapacs, Jackquline Denmark, MD Kym Groom, MD Vanna Scotland, MD DRUG ALLERGIES:   Allergies  Allergen Reactions  . Celexa [Citalopram Hydrobromide] Other (See Comments)    Aggressive   . Zyprexa [Olanzapine] Other (See Comments)   Aggressive behavior    DISCHARGE MEDICATIONS:   Allergies as of 02/01/2018      Reactions   Celexa [citalopram Hydrobromide] Other (See Comments)   Aggressive   Zyprexa [olanzapine] Other (See Comments)   Aggressive behavior      Medication List    TAKE these medications   ammonium lactate 12 % lotion Commonly known as:  LAC-HYDRIN Apply 1 application topically daily. Apply lotion to legs and chest daily after shower   aspirin 81 MG chewable tablet Chew 4 tablets (324 mg total) by mouth daily.   atorvastatin 40 MG tablet Commonly known as:  LIPITOR Take 1 tablet (40 mg total) by mouth daily at 6 PM.   benztropine 1 MG tablet Commonly known as:  COGENTIN Take 1 mg by mouth 2 (two) times daily.   bisacodyl 10 MG suppository Commonly known as:  DULCOLAX Place 10 mg rectally as needed for moderate constipation. Insert 1 suppository per rectum if no bowel movements in 48 hours   chlorhexidine 4 % external liquid Commonly known as:  HIBICLENS Apply 1 application topically once a week. Shower from head to toe weekly   diazepam 5 MG tablet Commonly known as:  VALIUM Take 1 tablet (5 mg total) by mouth every evening. What changed:  when to take this   docusate sodium 100 MG capsule Commonly known as:  COLACE Take 100 mg by mouth 2 (two) times daily.   eucerin cream Apply 1 application topically daily.   famotidine 20 MG tablet Commonly known as:  PEPCID Take 20 mg by mouth 2 (two) times daily before a meal.   haloperidol 1 MG tablet Commonly  known as:  HALDOL Take 3 mg by mouth 2 (two) times daily. In the morning and at 1630   miconazole 2 % powder Commonly known as:  MICOTIN Apply topically daily. Apply powder to groin, abdomen and buttocks daily after shower   ALOE VESTA ANTIFUNGAL 2 % Oint Generic drug:  Miconazole Nitrate Apply 1 application topically 2 (two) times daily as needed (itching). Apply to groin area   multivitamin with minerals Tabs  tablet Take 1 tablet by mouth daily.   vitamin B-12 500 MCG tablet Commonly known as:  CYANOCOBALAMIN Take 1,000 mcg by mouth 2 (two) times daily.   Vitamin D (Ergocalciferol) 50000 units Caps capsule Commonly known as:  DRISDOL Take 50,000 Units by mouth every 7 (seven) days.        DISCHARGE INSTRUCTIONS:  See AVS.  If you experience worsening of your admission symptoms, develop shortness of breath, life threatening emergency, suicidal or homicidal thoughts you must seek medical attention immediately by calling 911 or calling your MD immediately  if symptoms less severe.  You Must read complete instructions/literature along with all the possible adverse reactions/side effects for all the Medicines you take and that have been prescribed to you. Take any new Medicines after you have completely understood and accpet all the possible adverse reactions/side effects.   Please note  You were cared for by a hospitalist during your hospital stay. If you have any questions about your discharge medications or the care you received while you were in the hospital after you are discharged, you can call the unit and asked to speak with the hospitalist on call if the hospitalist that took care of you is not available. Once you are discharged, your primary care physician will handle any further medical issues. Please note that NO REFILLS for any discharge medications will be authorized once you are discharged, as it is imperative that you return to your primary care physician (or establish a relationship with a primary care physician if you do not have one) for your aftercare needs so that they can reassess your need for medications and monitor your lab values.    On the day of Discharge:  VITAL SIGNS:  Blood pressure 97/68, pulse 75, temperature 98.7 F (37.1 C), temperature source Oral, resp. rate 20, height 5\' 1"  (1.549 m), weight 67.5 kg, SpO2 97 %. PHYSICAL EXAMINATION:  GENERAL:  37  y.o.-year-old patient lying in the bed with no acute distress.  EYES: Pupils equal, round, reactive to light and accommodation. No scleral icterus. Extraocular muscles intact.  HEENT: Head atraumatic, normocephalic. Oropharynx and nasopharynx clear.  NECK:  Supple, no jugular venous distention. No thyroid enlargement, no tenderness.  LUNGS: Normal breath sounds bilaterally, no wheezing, rales,rhonchi or crepitation. No use of accessory muscles of respiration.  CARDIOVASCULAR: S1, S2 normal. No murmurs, rubs, or gallops.  ABDOMEN: Soft, non-tender, non-distended. Bowel sounds present. No organomegaly or mass.  EXTREMITIES: No pedal edema, cyanosis, or clubbing.  NEUROLOGIC: Unable to exam. PSYCHIATRIC: The patient has Down syndrome. SKIN: No obvious rash, lesion, or ulcer.  DATA REVIEW:   CBC Recent Labs  Lab 01/28/18 0526  WBC 8.0  HGB 9.5*  HCT 27.2*  PLT 132*    Chemistries  Recent Labs  Lab 01/26/18 0504  01/28/18 0526 01/31/18 0539  NA 140   < > 142  --   K 4.6   < > 3.6  --   CL 104   < > 114*  --   CO2 30   < >  23  --   GLUCOSE 244*   < > 105*  --   BUN 20   < > 17  --   CREATININE 0.70   < > 0.94 0.77  CALCIUM 8.3*   < > 7.8*  --   MG 2.2  --   --   --    < > = values in this interval not displayed.     Microbiology Results  Results for orders placed or performed during the hospital encounter of 12/17/17  Blood culture (routine x 2)     Status: None   Collection Time: 01/21/18  6:25 PM  Result Value Ref Range Status   Specimen Description BLOOD Blood Culture adequate volume  Final   Special Requests   Final    BOTTLES DRAWN AEROBIC AND ANAEROBIC LEFT ANTECUBITAL   Culture   Final    NO GROWTH 5 DAYS Performed at [facility], [address] [place], [place] [address]    Report Status 01/26/2018 FINAL  Final  Blood culture (routine x 2)     Status: None   Collection Time: 01/21/18  6:32 PM  Result Value Ref Range Status   Specimen Description  BLOOD Blood Culture adequate volume  Final   Special Requests   Final    BOTTLES DRAWN AEROBIC AND ANAEROBIC RIGHT ANTECUBITAL   Culture   Final    NO GROWTH 5 DAYS Performed at [facility], [address], [place], [place] [address]    Report Status 01/26/2018 FINAL  Final  MRSA PCR Screening     Status: None   Collection Time: 01/21/18  9:36 PM  Result Value Ref Range Status   MRSA by PCR NEGATIVE NEGATIVE Final    Comment:        The GeneXpert MRSA Assay (FDA approved for NASAL specimens only), is one component of a comprehensive MRSA colonization surveillance program. It is not intended to diagnose MRSA infection nor to guide or monitor treatment for MRSA infections. Performed at [facility], [address], [place], [place] [address]   Urine Culture     Status: None   Collection Time: 01/27/18  3:24 PM  Result Value Ref Range Status   Specimen Description   Final    URINE, RANDOM Performed at [facility], [address], [place], [place] [address]    Special Requests   Final    NONE Performed at [facility], [address], [place], [place] [address]    Culture   Final    NO GROWTH Performed at [facility], [address], [place], [place] [address]    Report Status 01/28/2018 FINAL  Final    RADIOLOGY:  No results found.   Management plans discussed with the patient, family and they are in agreement.  CODE STATUS: Full Code   TOTAL TIME TAKING CARE OF THIS PATIENT:  minutes.    Shaune Pollack M.D on 02/01/2018 at 4:26 PM  Between 7am to 6pm - Pager - [phone]  After 6pm go to [email or web address] - Social research officer, government  [facility] [place] [facility]  Office  [phone]  CC: Primary care physician; Margaretann Loveless, MD   Note: This dictation was prepared with Dragon dictation along with smaller phrase technology. Any transcriptional errors that result from this process are unintentional.

## 2018-02-01 NOTE — Care Management (Signed)
For discharge 10/10 to [facility]

## 2018-02-02 NOTE — Discharge Summary (Signed)
[facility] - [place] at [facility]   PATIENT NAME: Derrick Marshall    MR#:  [id number]  DATE OF BIRTH:  1980-06-22  DATE OF ADMISSION:  12/17/2017 ADMITTING PHYSICIAN: Campbell Stall, MD  DATE OF DISCHARGE: 02/02/2018  PRIMARY CARE PHYSICIAN: Margaretann Loveless, MD    ADMISSION DIAGNOSIS:  Violent behavior [R45.6] Hypotension, unspecified hypotension type [I95.9] Altered mental status, unspecified altered mental status type [R41.82]  DISCHARGE DIAGNOSIS:  Principal Problem:   Autistic spectrum disorder Active Problems:   Moderate intellectual disability   Agitation   Sepsis (HCC)   Paraphimosis   SECONDARY DIAGNOSIS:   Past Medical History:  Diagnosis Date  . Autism   . Down syndrome   . Tetralogy of Fallot     HOSPITAL COURSE:   37 year old male with history of Down syndrome who presented to the emergency room due to acute hypoxic respiratory failure.  1.  Acute hypoxic and hypercapnic respiratory failure due to aspiration pneumonia.  Patient was initially admitted to the ICU and intubated.  Patient has been extubated and doing well from a respiratory standpoint.  2.  Septic shock due to pneumonia: Patient septic shock is resolved  3.  Acute CVA: MRI shows multiple puncture acute infarcts due to hypotension.  Recommendations are to continue aspirin  4.  Down syndrome with history of aggressive behavior: Patient will continue outpatient regimen.  5. Paraphimosis: Patient was seen and evaluated by urology.  No further treatment at this time.   DISCHARGE CONDITIONS AND DIET:  Stable for discharge Dysphasia 1 diet with aspiration precautions  CONSULTS OBTAINED:  Treatment Team:  Clapacs, Jackquline Denmark, MD Kym Groom, MD Vanna Scotland, MD  DRUG ALLERGIES:   Allergies  Allergen Reactions  . Celexa [Citalopram Hydrobromide] Other (See Comments)    Aggressive   . Zyprexa [Olanzapine] Other (See Comments)    Aggressive behavior      DISCHARGE MEDICATIONS:   Allergies as of 02/02/2018      Reactions   Celexa [citalopram Hydrobromide] Other (See Comments)   Aggressive   Zyprexa [olanzapine] Other (See Comments)   Aggressive behavior      Medication List    TAKE these medications   ammonium lactate 12 % lotion Commonly known as:  LAC-HYDRIN Apply 1 application topically daily. Apply lotion to legs and chest daily after shower   aspirin 81 MG chewable tablet Chew 4 tablets (324 mg total) by mouth daily.   atorvastatin 40 MG tablet Commonly known as:  LIPITOR Take 1 tablet (40 mg total) by mouth daily at 6 PM.   benztropine 1 MG tablet Commonly known as:  COGENTIN Take 1 mg by mouth 2 (two) times daily.   bisacodyl 10 MG suppository Commonly known as:  DULCOLAX Place 10 mg rectally as needed for moderate constipation. Insert 1 suppository per rectum if no bowel movements in 48 hours   chlorhexidine 4 % external liquid Commonly known as:  HIBICLENS Apply 1 application topically once a week. Shower from head to toe weekly   diazepam 5 MG tablet Commonly known as:  VALIUM Take 1 tablet (5 mg total) by mouth every evening. What changed:  when to take this   docusate sodium 100 MG capsule Commonly known as:  COLACE Take 100 mg by mouth 2 (two) times daily.   eucerin cream Apply 1 application topically daily.   famotidine 20 MG tablet Commonly known as:  PEPCID Take 20 mg by mouth 2 (two) times daily before a meal.  haloperidol 1 MG tablet Commonly known as:  HALDOL Take 3 mg by mouth 2 (two) times daily. In the morning and at 1630   miconazole 2 % powder Commonly known as:  MICOTIN Apply topically daily. Apply powder to groin, abdomen and buttocks daily after shower   ALOE VESTA ANTIFUNGAL 2 % Oint Generic drug:  Miconazole Nitrate Apply 1 application topically 2 (two) times daily as needed (itching). Apply to groin area   multivitamin with minerals Tabs tablet Take 1 tablet by mouth  daily.   vitamin B-12 500 MCG tablet Commonly known as:  CYANOCOBALAMIN Take 1,000 mcg by mouth 2 (two) times daily.   Vitamin D (Ergocalciferol) 50000 units Caps capsule Commonly known as:  DRISDOL Take 50,000 Units by mouth every 7 (seven) days.         Today   CHIEF COMPLAINT:   No acute events overnight   VITAL SIGNS:  Blood pressure 110/76, pulse 77, temperature 98.3 F (36.8 C), temperature source Oral, resp. rate 20, height 5\' 1"  (1.549 m), weight 67.5 kg, SpO2 98 %.   REVIEW OF SYSTEMS:  Review of Systems  Unable to perform ROS: Other   Patient not very good historian due to underlying Down syndrome  PHYSICAL EXAMINATION:  GENERAL:  37 y.o.-year-old patient lying in the bed with no acute distress.  NECK:  Supple, no jugular venous distention. No thyroid enlargement, no tenderness.  LUNGS: Normal breath sounds bilaterally, no wheezing, rales,rhonchi  No use of accessory muscles of respiration.  CARDIOVASCULAR: S1, S2 normal. No murmurs, rubs, or gallops.  ABDOMEN: Soft, non-tender, non-distended. Bowel sounds present. No organomegaly or mass.  EXTREMITIES: No pedal edema, cyanosis, or clubbing.  PSYCHIATRIC: The patient is alert   SKIN: No obvious rash, lesion, or ulcer.   DATA REVIEW:   CBC Recent Labs  Lab 01/28/18 0526  WBC 8.0  HGB 9.5*  HCT 27.2*  PLT 132*    Chemistries  Recent Labs  Lab 01/28/18 0526 01/31/18 0539  NA 142  --   K 3.6  --   CL 114*  --   CO2 23  --   GLUCOSE 105*  --   BUN 17  --   CREATININE 0.94 0.77  CALCIUM 7.8*  --     Cardiac Enzymes Recent Labs  Lab 01/26/18 2109 01/27/18 0204 01/27/18 0826  TROPONINI 0.13* 0.08* 0.06*    Microbiology Results  @MICRORSLT48 @  RADIOLOGY:  No results found.    Allergies as of 02/02/2018      Reactions   Celexa [citalopram Hydrobromide] Other (See Comments)   Aggressive   Zyprexa [olanzapine] Other (See Comments)   Aggressive behavior      Medication List     TAKE these medications   ammonium lactate 12 % lotion Commonly known as:  LAC-HYDRIN Apply 1 application topically daily. Apply lotion to legs and chest daily after shower   aspirin 81 MG chewable tablet Chew 4 tablets (324 mg total) by mouth daily.   atorvastatin 40 MG tablet Commonly known as:  LIPITOR Take 1 tablet (40 mg total) by mouth daily at 6 PM.   benztropine 1 MG tablet Commonly known as:  COGENTIN Take 1 mg by mouth 2 (two) times daily.   bisacodyl 10 MG suppository Commonly known as:  DULCOLAX Place 10 mg rectally as needed for moderate constipation. Insert 1 suppository per rectum if no bowel movements in 48 hours   chlorhexidine 4 % external liquid Commonly known as:  HIBICLENS Apply  1 application topically once a week. Shower from head to toe weekly   diazepam 5 MG tablet Commonly known as:  VALIUM Take 1 tablet (5 mg total) by mouth every evening. What changed:  when to take this   docusate sodium 100 MG capsule Commonly known as:  COLACE Take 100 mg by mouth 2 (two) times daily.   eucerin cream Apply 1 application topically daily.   famotidine 20 MG tablet Commonly known as:  PEPCID Take 20 mg by mouth 2 (two) times daily before a meal.   haloperidol 1 MG tablet Commonly known as:  HALDOL Take 3 mg by mouth 2 (two) times daily. In the morning and at 1630   miconazole 2 % powder Commonly known as:  MICOTIN Apply topically daily. Apply powder to groin, abdomen and buttocks daily after shower   ALOE VESTA ANTIFUNGAL 2 % Oint Generic drug:  Miconazole Nitrate Apply 1 application topically 2 (two) times daily as needed (itching). Apply to groin area   multivitamin with minerals Tabs tablet Take 1 tablet by mouth daily.   vitamin B-12 500 MCG tablet Commonly known as:  CYANOCOBALAMIN Take 1,000 mcg by mouth 2 (two) times daily.   Vitamin D (Ergocalciferol) 50000 units Caps capsule Commonly known as:  DRISDOL Take 50,000 Units by mouth  every 7 (seven) days.          Management plans discussed with the patient and he is in agreement. Stable for discharge   Patient should follow up with pcp  CODE STATUS:     Code Status Orders  (From admission, onward)         Start     Ordered   01/21/18 2132  Full code  Continuous     01/21/18 2132        Code Status History    This patient has a current code status but no historical code status.      TOTAL TIME TAKING CARE OF THIS PATIENT: 38 minutes.    Note: This dictation was prepared with Dragon dictation along with smaller phrase technology. Any transcriptional errors that result from this process are unintentional.  Skylan Lara M.D on 02/02/2018 at 8:10 AM  Between 7am to 6pm - Pager - [phone] After 6pm go to [email or web address] - password [email or web address]  [facility]  Office  [phone]  CC: Primary care physician; Margaretann Loveless, MD

## 2018-02-02 NOTE — Clinical Social Work Note (Signed)
Patient is medically ready for discharge. Patient will discharge to [facility] today. CSW notified patient's sister Reed Pandy. Bonita Quin will transport patient. CSW signing off. Please re consult if further needs arise.   Ruthe Mannan MSW, [address] [phone]

## 2018-02-02 NOTE — Progress Notes (Signed)
Discussed discharge instructions and medications with patient and his sitter Bonita Quin.  All questions addressed. Patient transported to facility via car by his sister Bonita Quin.  Orson Ape, RN, BSN

## 2020-03-27 IMAGING — DX DG ABDOMEN ACUTE W/ 1V CHEST
3 series · 4 of 4 positions shown · non-contrast
Comparison: None.

CLINICAL DATA: 29-year-old male with chest pain.

EXAM:
DG ABDOMEN ACUTE W/ 1V CHEST

[Series 1: chest pa · 0.14mm/px · 2 of 2 slices shown]
[im 1/2]
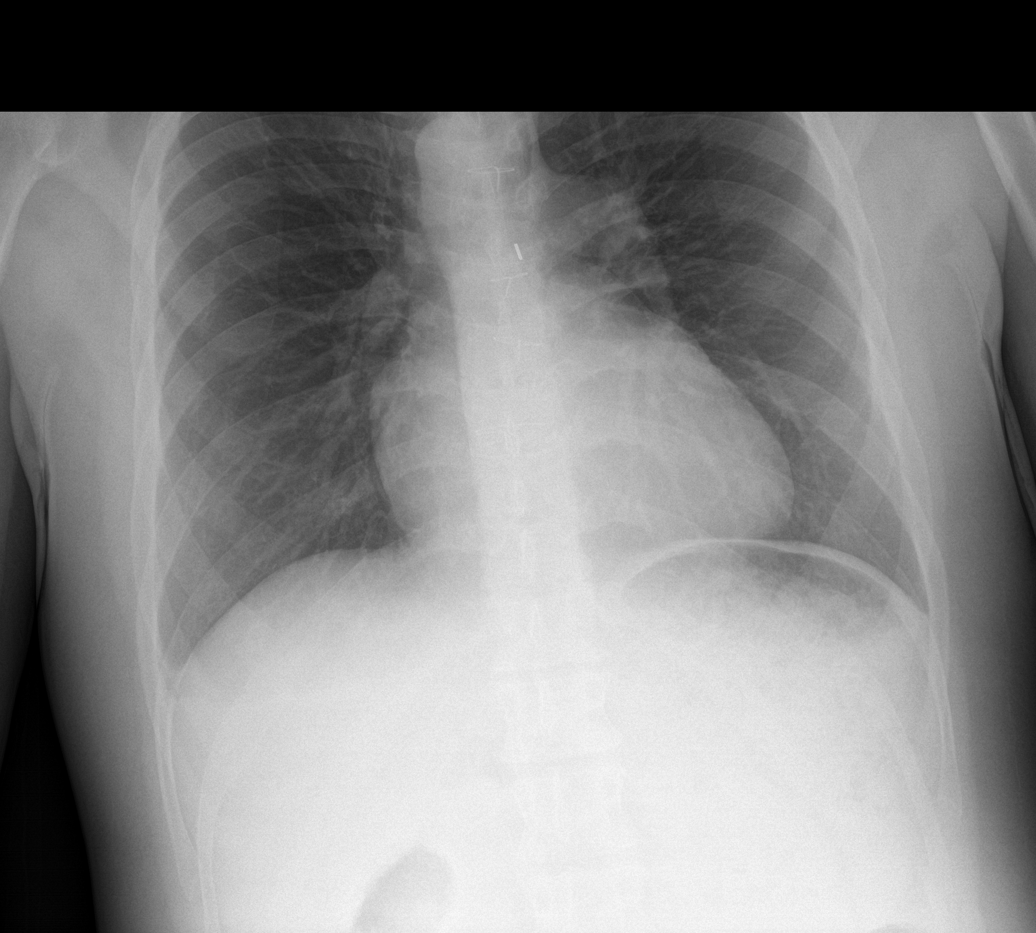
[im 2/2]
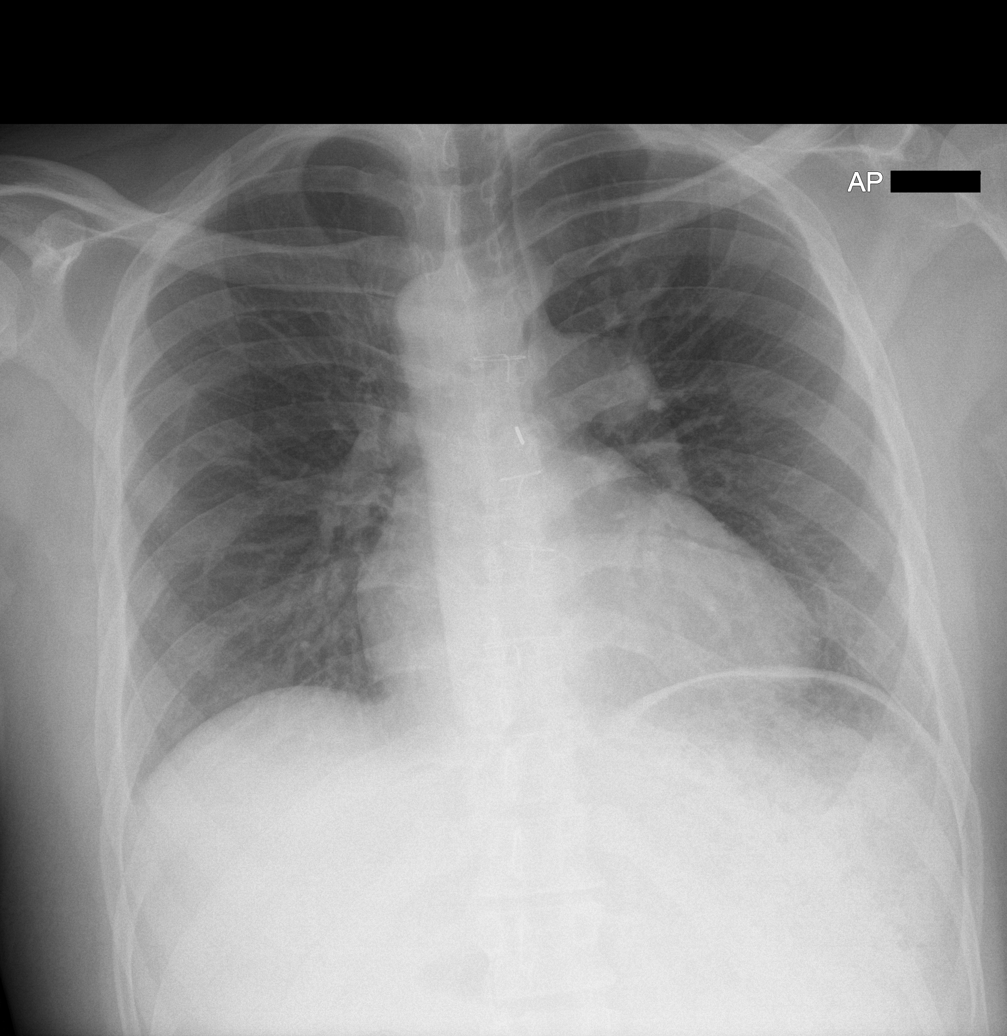

[abdomen erect]
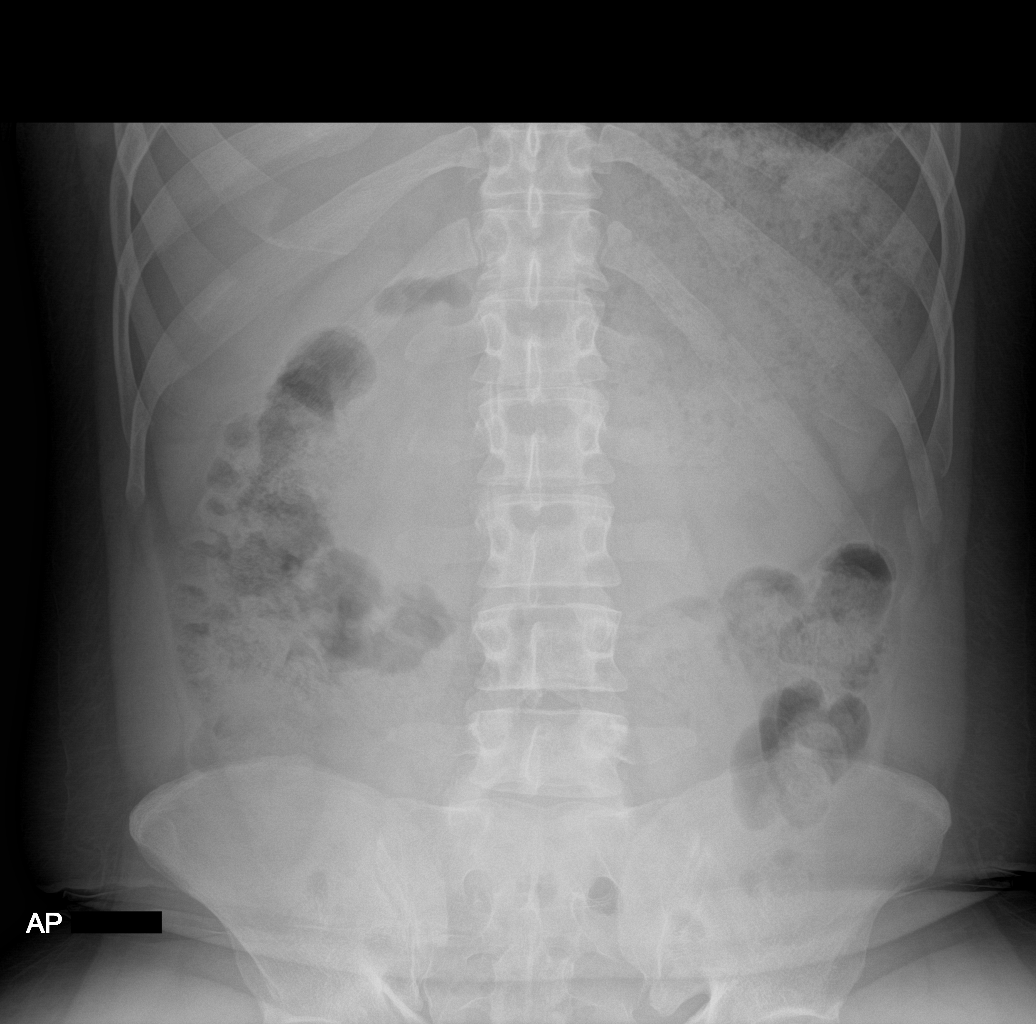

[abdomen supine]
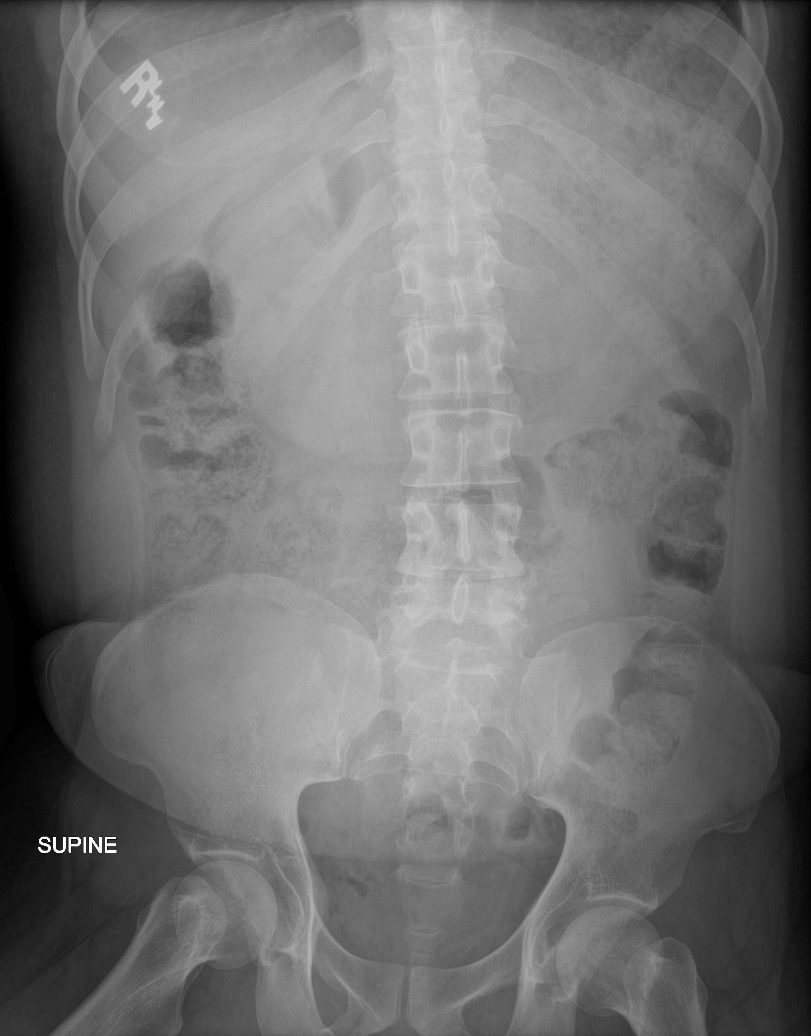

[4 of 4 positions shown; findings below may reference images not displayed]

FINDINGS: The lungs are clear. There is no pleural effusion or pneumothorax.
Top-normal cardiac size. Median sternotomy wires noted.

There is no bowel dilatation or evidence of obstruction. The stomach
is distended with gastric content. No free air or radiopaque
calculi. The osseous structures and soft tissues are grossly
unremarkable.
IMPRESSION: 1. No acute cardiopulmonary process.
2. Distended stomach with gastric content and moderate colonic stool
burden. No evidence of small-bowel obstruction.

## 2022-02-04 DIAGNOSIS — Q213 Tetralogy of Fallot: Secondary | ICD-10-CM | POA: Diagnosis not present

## 2022-02-04 DIAGNOSIS — E782 Mixed hyperlipidemia: Secondary | ICD-10-CM | POA: Diagnosis not present

## 2022-02-04 DIAGNOSIS — D72819 Decreased white blood cell count, unspecified: Secondary | ICD-10-CM | POA: Diagnosis not present

## 2022-02-04 DIAGNOSIS — E538 Deficiency of other specified B group vitamins: Secondary | ICD-10-CM | POA: Diagnosis not present

## 2022-02-04 DIAGNOSIS — B351 Tinea unguium: Secondary | ICD-10-CM | POA: Diagnosis not present

## 2022-02-04 DIAGNOSIS — Z23 Encounter for immunization: Secondary | ICD-10-CM | POA: Diagnosis not present

## 2022-02-04 DIAGNOSIS — K219 Gastro-esophageal reflux disease without esophagitis: Secondary | ICD-10-CM | POA: Diagnosis not present

## 2022-02-04 DIAGNOSIS — E559 Vitamin D deficiency, unspecified: Secondary | ICD-10-CM | POA: Diagnosis not present

## 2022-03-29 DIAGNOSIS — R9431 Abnormal electrocardiogram [ECG] [EKG]: Secondary | ICD-10-CM | POA: Diagnosis not present

## 2022-03-29 DIAGNOSIS — Z79899 Other long term (current) drug therapy: Secondary | ICD-10-CM | POA: Diagnosis not present

## 2022-03-29 DIAGNOSIS — Z7982 Long term (current) use of aspirin: Secondary | ICD-10-CM | POA: Diagnosis not present

## 2022-03-29 DIAGNOSIS — I4510 Unspecified right bundle-branch block: Secondary | ICD-10-CM | POA: Diagnosis not present

## 2022-03-29 DIAGNOSIS — Q213 Tetralogy of Fallot: Secondary | ICD-10-CM | POA: Diagnosis not present

## 2022-03-29 DIAGNOSIS — I371 Nonrheumatic pulmonary valve insufficiency: Secondary | ICD-10-CM | POA: Diagnosis not present

## 2022-04-05 ENCOUNTER — Ambulatory Visit: Payer: Self-pay | Admitting: Family Medicine

## 2022-04-05 NOTE — Progress Notes (Deleted)
New patient visit   Patient: Derrick Marshall   DOB: 01-06-1981   41 y.o. Male  MRN: [id number] Visit Date: 04/05/2022  Today's healthcare provider: Ronnald Ramp, MD   No chief complaint on file.  Subjective    Derrick Marshall is a 41 y.o. male who presents today as a new patient to establish care.  HPI  ***  Past Medical History:  Diagnosis Date   Autism    Down syndrome    Tetralogy of Fallot    Past Surgical History:  Procedure Laterality Date   TETRALOGY OF FALLOT REPAIR     No family status information on file.   No family history on file. Social History   Socioeconomic History   Marital status: Single    Spouse name: Not on file   Number of children: Not on file   Years of education: Not on file   Highest education level: Not on file  Occupational History   Not on file  Tobacco Use   Smoking status: Never   Smokeless tobacco: Never  Substance and Sexual Activity   Alcohol use: Never   Drug use: Never   Sexual activity: Not on file  Other Topics Concern   Not on file  Social History Narrative   Not on file   Social Determinants of Health   Financial Resource Strain: Not on file  Food Insecurity: Not on file  Transportation Needs: Not on file  Physical Activity: Not on file  Stress: Not on file  Social Connections: Not on file   Outpatient Medications Prior to Visit  Medication Sig   ammonium lactate (LAC-HYDRIN) 12 % lotion Apply 1 application topically daily. Apply lotion to legs and chest daily after shower   aspirin 81 MG chewable tablet Chew 4 tablets (324 mg total) by mouth daily.   atorvastatin (LIPITOR) 40 MG tablet Take 1 tablet (40 mg total) by mouth daily at 6 PM.   benztropine (COGENTIN) 1 MG tablet Take 1 mg by mouth 2 (two) times daily.   bisacodyl (DULCOLAX) 10 MG suppository Place 10 mg rectally as needed for moderate constipation. Insert 1 suppository per rectum if no bowel movements in 48 hours   chlorhexidine  (HIBICLENS) 4 % external liquid Apply 1 application topically once a week. Shower from head to toe weekly   diazepam (VALIUM) 5 MG tablet Take 1 tablet (5 mg total) by mouth every evening.   docusate sodium (COLACE) 100 MG capsule Take 100 mg by mouth 2 (two) times daily.   famotidine (PEPCID) 20 MG tablet Take 20 mg by mouth 2 (two) times daily before a meal.   haloperidol (HALDOL) 1 MG tablet Take 3 mg by mouth 2 (two) times daily. In the morning and at 1630   miconazole (MICOTIN) 2 % powder Apply topically daily. Apply powder to groin, abdomen and buttocks daily after shower   Miconazole Nitrate (ALOE VESTA ANTIFUNGAL) 2 % OINT Apply 1 application topically 2 (two) times daily as needed (itching). Apply to groin area   Multiple Vitamin (MULTIVITAMIN WITH MINERALS) TABS tablet Take 1 tablet by mouth daily.   Skin Protectants, Misc. (EUCERIN) cream Apply 1 application topically daily.    vitamin B-12 (CYANOCOBALAMIN) 500 MCG tablet Take 1,000 mcg by mouth 2 (two) times daily.   Vitamin D, Ergocalciferol, (DRISDOL) 50000 units CAPS capsule Take 50,000 Units by mouth every 7 (seven) days.   No facility-administered medications prior to visit.   Allergies  Allergen Reactions   Celexa [  Citalopram Hydrobromide] Other (See Comments)    Aggressive    Zyprexa [Olanzapine] Other (See Comments)    Aggressive behavior     Immunization History  Administered Date(s) Administered   Influenza,inj,Quad PF,6+ Mos 01/31/2018    Health Maintenance  Topic Date Due   Medicare Annual Wellness (AWV)  Never done   COVID-19 Vaccine (1) Never done   Hepatitis C Screening  Never done   DTaP/Tdap/Td (1 - Tdap) Never done   INFLUENZA VACCINE  Completed   HIV Screening  Completed   HPV VACCINES  Aged Out    Patient Care Team: Margaretann Loveless, MD as PCP - General (Internal Medicine)  Review of Systems  {Labs  Heme  Chem  Endocrine  Serology  Results Review (optional):23779}   Objective     There were no vitals taken for this visit. {Show previous vital signs (optional):23777}  Physical Exam ***  Depression Screen     No data to display         No results found for any visits on 04/05/22.  Assessment & Plan     ***  No follow-ups on file.     {provider attestation***:1}   Ronnald Ramp, MD  [facility] [phone] (phone) [phone] (fax)  [facility]

## 2022-06-02 DIAGNOSIS — M25562 Pain in left knee: Secondary | ICD-10-CM | POA: Diagnosis not present

## 2022-06-05 DIAGNOSIS — Q213 Tetralogy of Fallot: Secondary | ICD-10-CM | POA: Diagnosis not present

## 2022-06-08 ENCOUNTER — Ambulatory Visit (INDEPENDENT_AMBULATORY_CARE_PROVIDER_SITE_OTHER): Payer: 59 | Admitting: Podiatry

## 2022-06-08 DIAGNOSIS — B351 Tinea unguium: Secondary | ICD-10-CM | POA: Diagnosis not present

## 2022-06-08 DIAGNOSIS — M79675 Pain in left toe(s): Secondary | ICD-10-CM

## 2022-06-08 DIAGNOSIS — M79674 Pain in right toe(s): Secondary | ICD-10-CM

## 2022-06-08 NOTE — Progress Notes (Signed)
   Chief Complaint  Patient presents with   Nail Problem    Routine foot care, nail trim, nail fungus, right hallux nail split and has some bleeding,     Subjective: 42 y.o. male presenting today with his caregiver for evaluation of thick dystrophic elongated nails noted bilaterally.  Patient's caregiver states that she is uncomfortable trimming his toenails.  She also would like to discuss his fungal nails and discussed different treatment options.  Past Medical History:  Diagnosis Date   Autism    Down syndrome    Tetralogy of Fallot     Past Surgical History:  Procedure Laterality Date   TETRALOGY OF FALLOT REPAIR      Allergies  Allergen Reactions   Celexa [Citalopram Hydrobromide] Other (See Comments)    Aggressive    Zyprexa [Olanzapine] Other (See Comments)    Aggressive behavior     Objective: Physical Exam General: The patient is alert and oriented x3 in no acute distress.  Dermatology: Hyperkeratotic, discolored, thickened, onychodystrophy noted bilateral. Skin is warm, dry and supple bilateral lower extremities. Negative for open lesions or macerations.  Vascular: Palpable pedal pulses bilaterally. No edema or erythema noted. Capillary refill within normal limits.  Neurological: Grossly intact via light touch  Musculoskeletal Exam: No pedal deformity noted   Assessment: #1  Pain due to onychomycosis of toenails both  Plan of Care:  #1 Patient was evaluated.  Mechanical debridement of nails 1-5 bilateral was performed using a nail nipper without incident or bleeding #2  Today we discussed different treatment options including oral, topical, and laser antifungal treatment modalities.  We discussed their efficacies and side effects.  Patient's caregiver would like to pursue oral antifungal treatment modality however comprehensive metabolic panel AP levels were elevated on 02/04/2022.  We will wait for next updated blood work and prescribe Lamisil when LFTs  are WNL #3  Return to clinic 3 months for routine footcare  Edrick Kins, DPM [facility]  Dr. Edrick Kins, DPM    [address], [place] [address]                Office [phone]  Fax [phone]

## 2022-06-09 DIAGNOSIS — E782 Mixed hyperlipidemia: Secondary | ICD-10-CM | POA: Diagnosis not present

## 2022-06-09 DIAGNOSIS — L853 Xerosis cutis: Secondary | ICD-10-CM | POA: Diagnosis not present

## 2022-06-09 DIAGNOSIS — K5900 Constipation, unspecified: Secondary | ICD-10-CM | POA: Diagnosis not present

## 2022-06-09 DIAGNOSIS — E559 Vitamin D deficiency, unspecified: Secondary | ICD-10-CM | POA: Diagnosis not present

## 2022-06-09 DIAGNOSIS — Z1159 Encounter for screening for other viral diseases: Secondary | ICD-10-CM | POA: Diagnosis not present

## 2022-06-09 DIAGNOSIS — R339 Retention of urine, unspecified: Secondary | ICD-10-CM | POA: Diagnosis not present

## 2022-06-09 DIAGNOSIS — K029 Dental caries, unspecified: Secondary | ICD-10-CM | POA: Diagnosis not present

## 2022-07-22 ENCOUNTER — Ambulatory Visit
Admission: RE | Admit: 2022-07-22 | Discharge: 2022-07-22 | Disposition: A | Discharge status: Home / Self Care | Payer: 59 | Source: Ambulatory Visit | Attending: Emergency Medicine | Admitting: Emergency Medicine

## 2022-07-22 VITALS — BP 138/85 | HR 103 | Temp 98.7°F | Resp 16

## 2022-07-22 DIAGNOSIS — R350 Frequency of micturition: Secondary | ICD-10-CM

## 2022-07-22 LAB — URINALYSIS, W/ REFLEX TO CULTURE (INFECTION SUSPECTED)
Bacteria, UA: NONE SEEN
Bilirubin Urine: NEGATIVE
Glucose, UA: NEGATIVE mg/dL
Hgb urine dipstick: NEGATIVE
Ketones, ur: NEGATIVE mg/dL
Leukocytes,Ua: NEGATIVE
Nitrite: NEGATIVE
Protein, ur: NEGATIVE mg/dL
RBC / HPF: NONE SEEN RBC/hpf (ref 0–5)
Specific Gravity, Urine: 1.01 (ref 1.005–1.030)
Squamous Epithelial / HPF: NONE SEEN /HPF (ref 0–5)
WBC, UA: NONE SEEN WBC/hpf (ref 0–5)
pH: 6.5 (ref 5.0–8.0)

## 2022-07-22 NOTE — Discharge Instructions (Addendum)
There is no evidence of infection in Derrick Marshall's urine.  There is also no sugar present to suggest possible diabetes.  We will refer you to [facility] urology for further evaluation.

## 2022-07-22 NOTE — ED Provider Notes (Signed)
[facility]    CSN: [id number] Arrival date & time: 07/22/22  1658      History   Chief Complaint Chief Complaint  Patient presents with   Urinary Frequency    HPI Derrick Marshall is a 42 y.o. male.   HPI  42 year old male with a past medical history significant for autism, Down syndrome, and tetralogy of Fallot repair presents for evaluation of urinary frequency for 1 month.  He is here with his caregiver to rule out a urinary tract infection.  Caregiver reports that he has had frequent nocturia and also some cloudiness of his urine but denies any complaints of pain with urination or seeing any blood in the urine.  No fever, nausea, vomiting, or episodes of incontinence.  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 Authorizing Provider  ammonium lactate (LAC-HYDRIN) 12 % lotion Apply 1 application topically daily. Apply lotion to legs and chest daily after shower    [provider]  aspirin 81 MG chewable tablet Chew 4 tablets (324 mg total) by mouth daily. 01/31/18   Demetrios Loll, MD  atorvastatin (LIPITOR) 40 MG tablet Take 1 tablet (40 mg total) by mouth daily at 6 PM. 01/31/18   Demetrios Loll, MD  benztropine (COGENTIN) 1 MG tablet Take 1 mg by mouth 2 (two) times daily.    [provider]  bisacodyl (DULCOLAX) 10 MG suppository Place 10 mg rectally as needed for moderate constipation. Insert 1 suppository per rectum if no bowel movements in 48 hours    [provider]  chlorhexidine (HIBICLENS) 4  % external liquid Apply 1 application topically once a week. Shower from head to toe weekly    [provider]  diazepam (VALIUM) 5 MG tablet Take 1 tablet (5 mg total) by mouth every evening. 01/31/18   Demetrios Loll, MD  docusate sodium (COLACE) 100 MG capsule Take 100 mg by mouth 2 (two) times daily.    [provider]  famotidine (PEPCID) 20 MG tablet Take 20 mg by mouth 2 (two) times daily before a meal.    [provider]  haloperidol (HALDOL) 1 MG tablet Take 3 mg by mouth 2 (two) times daily. In the morning and at 1630    [provider]  miconazole (MICOTIN) 2 % powder Apply topically daily. Apply powder to groin, abdomen and buttocks daily after shower    [provider]  Miconazole Nitrate (ALOE VESTA ANTIFUNGAL) 2 % OINT Apply 1 application topically 2 (two) times daily as needed (itching). Apply to groin area    [provider]  Multiple Vitamin (MULTIVITAMIN WITH MINERALS) TABS tablet Take 1 tablet by mouth daily.    [provider]  Skin Protectants, Misc. (EUCERIN) cream Apply 1 application topically daily.     [provider]  vitamin B-12 (CYANOCOBALAMIN) 500 MCG tablet Take 1,000 mcg by mouth 2 (two) times daily.    [provider]  Vitamin D, Ergocalciferol, (  DRISDOL) 50000 units CAPS capsule Take 50,000 Units by mouth every 7 (seven) days.    [provider]    Family History History reviewed. No pertinent family history.  Social History Social History   Tobacco Use   Smoking status: Never   Smokeless tobacco: Never  Substance Use Topics   Alcohol use: Never   Drug use: Never     Allergies   Celexa [citalopram hydrobromide] and Zyprexa [olanzapine]   Review of Systems Review of Systems  Constitutional:  Negative for fever.  Gastrointestinal:  Negative for nausea and vomiting.  Genitourinary:  Positive for frequency. Negative for dysuria, hematuria and urgency.      Physical Exam Triage Vital Signs ED Triage Vitals  Enc Vitals Group     BP 07/22/22 1714 138/85     Pulse Rate 07/22/22 1714 (!) 103     Resp 07/22/22 1714 16     Temp 07/22/22 1714 98.7 F (37.1 C)     Temp Source 07/22/22 1714 Oral     SpO2 07/22/22 1714 98 %     Weight --      Height --      Head Circumference --      Peak Flow --      Pain Score 07/22/22 1715 0     Pain Loc --      Pain Edu? --      Excl. in [place]? --    No data found.  Updated Vital Signs BP 138/85 (BP Location: Left Arm)   Pulse (!) 103   Temp 98.7 F (37.1 C) (Oral)   Resp 16   SpO2 98%   Visual Acuity Right Eye Distance:   Left Eye Distance:   Bilateral Distance:    Right Eye Near:   Left Eye Near:    Bilateral Near:     Physical Exam Vitals and nursing note reviewed.  Constitutional:      Appearance: Normal appearance. He is not ill-appearing.  Cardiovascular:     Rate and Rhythm: Normal rate and regular rhythm.     Pulses: Normal pulses.     Heart sounds: Normal heart sounds. No murmur heard.    No friction rub. No gallop.  Pulmonary:     Effort: Pulmonary effort is normal.     Breath sounds: Normal breath sounds. No wheezing, rhonchi or rales.  Abdominal:     General: Abdomen is flat.     Palpations: Abdomen is soft.     Tenderness: There is no right CVA tenderness or left CVA tenderness.  Skin:    General: Skin is warm and dry.     Capillary Refill: Capillary refill takes less than 2 seconds.  Neurological:     General: No focal deficit present.     Mental Status: He is alert and oriented to person, place, and time.      UC Treatments / Results  Labs (all labs ordered are listed, but only abnormal results are displayed) Labs Reviewed  URINALYSIS, W/ REFLEX TO CULTURE (INFECTION SUSPECTED) - Abnormal; Notable for the following components:      Result Value   Color, Urine STRAW (*)    All other components within normal limits    EKG   Radiology No results  found.  Procedures Procedures (including critical care time)  Medications Ordered in UC Medications - No data to display  Initial Impression / Assessment and Plan / UC Course  I have reviewed the triage vital signs and the  nursing notes.  Pertinent labs & imaging results that were available during my care of the patient were reviewed by me and considered in my medical decision making (see chart for details).   Patient is a nontoxic-appearing 42 year old male presenting for evaluation of increased urinary frequency for a month or more.  The caregiver that is with him reports that his urine may have been a little bit cloudy during this timeframe but he has not seen any blood in the urine and the patient has not complained of pain with urination.  Patient is also not had fevers, nausea, vomiting, or incontinence.  The caregiver does report that the patient is voiding upwards of 10+ times a day and has had frequent episodes of nocturia.  The caregiver is unaware of any history of urinary tract infections.  I will order urinalysis to look for the presence of UTI.  Urinalysis is unremarkable.  I discussed the absence of infection with the caregiver and inquired about patient's liquid consumption.  He states that he has not had any increase in his liquid consumption.  I will refer the patient to urology for further evaluation.   Final Clinical Impressions(s) / UC Diagnoses   Final diagnoses:  Urinary frequency     Discharge Instructions      There is no evidence of infection in Fernand's urine.  There is also no sugar present to suggest possible diabetes.  We will refer you to [facility] urology for further evaluation.     ED Prescriptions   None    PDMP not reviewed this encounter.   Margarette Canada, NP 07/22/22 1742

## 2022-07-22 NOTE — ED Triage Notes (Signed)
Patient presents to UC accompanied by caregiver to rule out UTI. He states he has urinary freq x 1 month. No OTC treatment.

## 2022-09-06 ENCOUNTER — Encounter: Payer: Self-pay | Admitting: Podiatry

## 2022-09-06 ENCOUNTER — Ambulatory Visit (INDEPENDENT_AMBULATORY_CARE_PROVIDER_SITE_OTHER): Payer: 59 | Admitting: Podiatry

## 2022-09-06 DIAGNOSIS — B351 Tinea unguium: Secondary | ICD-10-CM

## 2022-09-06 DIAGNOSIS — M79674 Pain in right toe(s): Secondary | ICD-10-CM | POA: Diagnosis not present

## 2022-09-06 DIAGNOSIS — M79675 Pain in left toe(s): Secondary | ICD-10-CM | POA: Diagnosis not present

## 2022-09-06 NOTE — Progress Notes (Unsigned)
  Subjective:  Patient ID: Derrick Marshall, male    DOB: 06/16/1980,  MRN: [id number]  Derrick Marshall presents to clinic today for painful elongated mycotic toenails 1-5 bilaterally which are tender when wearing enclosed shoe gear. Pain is relieved with periodic professional debridement.  Chief Complaint  Patient presents with   Nail Problem    RFC:pcp: Ardyth Gal, LOV: 02/24   New problem(s): None. {jgcomplaint:23593}  PCP is Margaretann Loveless, MD.  Allergies  Allergen Reactions   Celexa [Citalopram Hydrobromide] Other (See Comments)    Aggressive    Citalopram     Other Reaction(s): Other (See Comments)    Aggressive   Zyprexa [Olanzapine] Other (See Comments)    Aggressive behavior     Review of Systems: Negative except as noted in the HPI.  Objective: No changes noted in today's physical examination. There were no vitals filed for this visit. Derrick Marshall is a pleasant 42 y.o. male {jgbodyhabitus:24098} AAO x 3.  Vascular Examination: Capillary refill time immediate b/l. Vascular status intact b/l with palpable pedal pulses. Pedal hair present b/l. No edema. No pain with calf compression b/l. Skin temperature gradient WNL b/l. {jgvascular:23595}  Neurological Examination: Sensation grossly intact b/l with 10 gram monofilament. Vibratory sensation intact b/l. {jgneuro:23601::"Protective sensation intact 5/5 intact bilaterally with 10g monofilament b/l.","Vibratory sensation intact b/l.","Proprioception intact bilaterally."}  Dermatological Examination: Pedal skin with normal turgor, texture and tone b/l.  No open wounds. No interdigital macerations.   Toenails 1-5 b/l thick, discolored, elongated with subungual debris and pain on dorsal palpation.   {jgderm:23598}  Musculoskeletal Examination: {jgmsk:23600}  Radiographs: None  Assessment/Plan: 1. Pain due to onychomycosis of toenails of both feet     No orders of the defined types were placed in this  encounter.   None -Patient was evaluated and treated. All patient's and/or POA's questions/concerns answered on today's visit. -Patient to continue soft, supportive shoe gear daily. -Toenails 1-5 b/l were debrided in length and girth with sterile nail nippers and dremel without iatrogenic bleeding.  -Patient/POA to call should there be question/concern in the interim.   Return in about 3 months (around 12/07/2022).  Freddie Breech, DPM

## 2022-09-16 DIAGNOSIS — E559 Vitamin D deficiency, unspecified: Secondary | ICD-10-CM | POA: Diagnosis not present

## 2022-09-16 DIAGNOSIS — Z1159 Encounter for screening for other viral diseases: Secondary | ICD-10-CM | POA: Diagnosis not present

## 2022-09-16 DIAGNOSIS — Z1329 Encounter for screening for other suspected endocrine disorder: Secondary | ICD-10-CM | POA: Diagnosis not present

## 2022-09-16 DIAGNOSIS — D72819 Decreased white blood cell count, unspecified: Secondary | ICD-10-CM | POA: Diagnosis not present

## 2022-09-16 DIAGNOSIS — K5900 Constipation, unspecified: Secondary | ICD-10-CM | POA: Diagnosis not present

## 2022-09-16 DIAGNOSIS — Z13220 Encounter for screening for lipoid disorders: Secondary | ICD-10-CM | POA: Diagnosis not present

## 2022-09-16 DIAGNOSIS — Z131 Encounter for screening for diabetes mellitus: Secondary | ICD-10-CM | POA: Diagnosis not present

## 2022-09-16 DIAGNOSIS — R410 Disorientation, unspecified: Secondary | ICD-10-CM | POA: Diagnosis not present

## 2022-09-22 ENCOUNTER — Ambulatory Visit: Payer: 59 | Admitting: Urology

## 2022-09-22 ENCOUNTER — Other Ambulatory Visit: Payer: Self-pay | Admitting: *Deleted

## 2022-09-22 DIAGNOSIS — R350 Frequency of micturition: Secondary | ICD-10-CM

## 2022-09-23 ENCOUNTER — Encounter: Payer: Self-pay | Admitting: Urology

## 2022-09-23 ENCOUNTER — Ambulatory Visit (INDEPENDENT_AMBULATORY_CARE_PROVIDER_SITE_OTHER): Payer: 59 | Admitting: Urology

## 2022-09-23 VITALS — BP 116/72 | HR 97 | Ht 63.0 in | Wt 150.0 lb

## 2022-09-23 DIAGNOSIS — N3281 Overactive bladder: Secondary | ICD-10-CM | POA: Diagnosis not present

## 2022-09-23 DIAGNOSIS — R350 Frequency of micturition: Secondary | ICD-10-CM | POA: Diagnosis not present

## 2022-09-23 LAB — MICROSCOPIC EXAMINATION

## 2022-09-23 LAB — URINALYSIS, COMPLETE
Bilirubin, UA: NEGATIVE
Glucose, UA: NEGATIVE
Ketones, UA: NEGATIVE
Leukocytes,UA: NEGATIVE
Nitrite, UA: NEGATIVE
Protein,UA: NEGATIVE
RBC, UA: NEGATIVE
Specific Gravity, UA: 1.025 (ref 1.005–1.030)
Urobilinogen, Ur: 0.2 mg/dL (ref 0.2–1.0)
pH, UA: 5.5 (ref 5.0–7.5)

## 2022-09-23 LAB — BLADDER SCAN AMB NON-IMAGING: Scan Result: 33

## 2022-09-23 NOTE — Progress Notes (Signed)
Derrick Marshall,acting as a scribe for Riki Altes, MD.,have documented all relevant documentation on the behalf of Riki Altes, MD,as directed by  Riki Altes, MD while in the presence of Riki Altes, MD.  09/23/2022 1:15 PM   Derrick Marshall 17-May-1980 [id number]  Referring provider: Becky Augusta, NP [address] [place],  [place] [address]  Chief Complaint  Patient presents with   Urinary Frequency    HPI: Derrick Marshall is a 42 y.o. male who is referred for urinary frequency.  His sister is with him today who is his legal guardian  Medical history significant for autism, down syndrome, and Tetralogy of FALLOT Seen in [facility] 07/22/2022 for urinary frequency. Urinalysis was normal and he was referred to urology for further evaluation In 06/2022, had onset of urinary frequency. At times, he voids only small amounts of urine, however, at other times, he voids normal amounts. She estimates that he goes to the bathroom 10-15 times per day. He does have some dribbling but no urge incontinence No flank or abdominal pain,  No dysuria, no gross hematuria   PMH: Past Medical History:  Diagnosis Date   Autism    Down syndrome    Tetralogy of Fallot     Surgical History: Past Surgical History:  Procedure Laterality Date   TETRALOGY OF FALLOT REPAIR      Home Medications:  Allergies as of 09/23/2022       Reactions   Celexa [citalopram Hydrobromide] Other (See Comments)   Aggressive   Citalopram    Other Reaction(s): Other (See Comments)    Aggressive   Zyprexa [olanzapine] Other (See Comments)   Aggressive behavior        Medication List        Accurate as of Sep 23, 2022  1:15 PM. If you have any questions, ask your nurse or doctor.          ammonium lactate 12 % lotion Commonly known as: LAC-HYDRIN Apply 1 application topically daily. Apply lotion to legs and chest daily after shower   aspirin 81 MG chewable  tablet Chew 4 tablets (324 mg total) by mouth daily.   atorvastatin 40 MG tablet Commonly known as: LIPITOR Take 1 tablet (40 mg total) by mouth daily at 6 PM.   benztropine 1 MG tablet Commonly known as: COGENTIN Take 1 mg by mouth 2 (two) times daily.   bisacodyl 10 MG suppository Commonly known as: DULCOLAX Place 10 mg rectally as needed for moderate constipation. Insert 1 suppository per rectum if no bowel movements in 48 hours   chlorhexidine 4 % external liquid Commonly known as: HIBICLENS Apply 1 application topically once a week. Shower from head to toe weekly   diazepam 5 MG tablet Commonly known as: VALIUM Take 1 tablet (5 mg total) by mouth every evening.   docusate sodium 100 MG capsule Commonly known as: COLACE Take 100 mg by mouth 2 (two) times daily.   eucerin cream Apply 1 application  topically daily.   famotidine 20 MG tablet Commonly known as: PEPCID Take 20 mg by mouth 2 (two) times daily before a meal.   haloperidol 1 MG tablet Commonly known as: HALDOL Take 3 mg by mouth 2 (two) times daily. In the morning and at 1630   miconazole 2 % powder Commonly known as: MICOTIN Apply topically daily. Apply powder to groin, abdomen and buttocks daily after shower   Aloe Vesta Antifungal 2 %  Oint Generic drug: Miconazole Nitrate Apply 1 application topically 2 (two) times daily as needed (itching). Apply to groin area   multivitamin with minerals Tabs tablet Take 1 tablet by mouth daily.   vitamin B-12 500 MCG tablet Commonly known as: CYANOCOBALAMIN Take 1,000 mcg by mouth 2 (two) times daily.   Vitamin D (Ergocalciferol) 1.25 MG (50000 UNIT) Caps capsule Commonly known as: DRISDOL Take 50,000 Units by mouth every 7 (seven) days.        Allergies:  Allergies  Allergen Reactions   Celexa [Citalopram Hydrobromide] Other (See Comments)    Aggressive    Citalopram     Other Reaction(s): Other (See Comments)    Aggressive   Zyprexa  [Olanzapine] Other (See Comments)    Aggressive behavior     Social History:  reports that he has never smoked. He has never used smokeless tobacco. He reports that he does not drink alcohol and does not use drugs.   Physical Exam: BP 116/72   Pulse 97   Ht 5\' 3"  (1.6 m)   Wt 150 lb (68 kg)   BMI 26.57 kg/m   Constitutional:  Alert and oriented, No acute distress. HEENT: [place] AT, moist mucus membranes.  Trachea midline, no masses. Cardiovascular: No clubbing, cyanosis, or edema. Respiratory: Normal respiratory effort, no increased work of breathing. GI: Abdomen is soft, nontender, nondistended, no abdominal masses Skin: No rashes, bruises or suspicious lesions. Neurologic: Grossly intact, no focal deficits, moving all 4 extremities. Psychiatric: Normal mood and affect.  Laboratory Data:  Urinalysis Dipstick/ microscopy: Negative  Assessment & Plan:    1. Urinary frequency UA today with negative dipstick/microscopy PVR 33 mL Based on normal UA x2 and no elevated residual, symptoms most likely are secondary to bladder overactivity Trial of Gemtesa 75 mg daily- samples given They will call back regarding efficacy  I have reviewed the above documentation for accuracy and completeness, and I agree with the above.   Riki Altes, MD  [facility] [address] [place], [place] [address] [phone]

## 2022-12-09 ENCOUNTER — Ambulatory Visit (INDEPENDENT_AMBULATORY_CARE_PROVIDER_SITE_OTHER): Payer: 59 | Admitting: Podiatry

## 2022-12-09 DIAGNOSIS — Z91199 Patient's noncompliance with other medical treatment and regimen due to unspecified reason: Secondary | ICD-10-CM

## 2022-12-09 NOTE — Progress Notes (Signed)
Patient came in and canceled appointment.

## 2022-12-13 DIAGNOSIS — Z0001 Encounter for general adult medical examination with abnormal findings: Secondary | ICD-10-CM | POA: Diagnosis not present

## 2022-12-13 DIAGNOSIS — Q213 Tetralogy of Fallot: Secondary | ICD-10-CM | POA: Diagnosis not present

## 2022-12-13 DIAGNOSIS — K029 Dental caries, unspecified: Secondary | ICD-10-CM | POA: Diagnosis not present

## 2022-12-13 DIAGNOSIS — B356 Tinea cruris: Secondary | ICD-10-CM | POA: Diagnosis not present

## 2022-12-13 DIAGNOSIS — K219 Gastro-esophageal reflux disease without esophagitis: Secondary | ICD-10-CM | POA: Diagnosis not present

## 2022-12-13 DIAGNOSIS — K5900 Constipation, unspecified: Secondary | ICD-10-CM | POA: Diagnosis not present

## 2022-12-13 DIAGNOSIS — E782 Mixed hyperlipidemia: Secondary | ICD-10-CM | POA: Diagnosis not present

## 2023-01-03 DIAGNOSIS — E782 Mixed hyperlipidemia: Secondary | ICD-10-CM | POA: Diagnosis not present

## 2023-02-18 DIAGNOSIS — R197 Diarrhea, unspecified: Secondary | ICD-10-CM | POA: Diagnosis not present

## 2023-02-18 DIAGNOSIS — D6489 Other specified anemias: Secondary | ICD-10-CM | POA: Diagnosis not present

## 2023-03-29 DIAGNOSIS — R059 Cough, unspecified: Secondary | ICD-10-CM | POA: Diagnosis not present

## 2023-03-29 DIAGNOSIS — J209 Acute bronchitis, unspecified: Secondary | ICD-10-CM | POA: Diagnosis not present

## 2023-06-15 DIAGNOSIS — E782 Mixed hyperlipidemia: Secondary | ICD-10-CM | POA: Diagnosis not present

## 2023-06-15 DIAGNOSIS — E538 Deficiency of other specified B group vitamins: Secondary | ICD-10-CM | POA: Diagnosis not present

## 2023-06-15 DIAGNOSIS — K219 Gastro-esophageal reflux disease without esophagitis: Secondary | ICD-10-CM | POA: Diagnosis not present

## 2023-06-15 DIAGNOSIS — K5900 Constipation, unspecified: Secondary | ICD-10-CM | POA: Diagnosis not present

## 2023-07-19 DIAGNOSIS — D7589 Other specified diseases of blood and blood-forming organs: Secondary | ICD-10-CM | POA: Diagnosis not present

## 2023-07-19 DIAGNOSIS — R748 Abnormal levels of other serum enzymes: Secondary | ICD-10-CM | POA: Diagnosis not present

## 2023-07-19 DIAGNOSIS — D539 Nutritional anemia, unspecified: Secondary | ICD-10-CM | POA: Diagnosis not present

## 2023-07-19 DIAGNOSIS — D649 Anemia, unspecified: Secondary | ICD-10-CM | POA: Diagnosis not present

## 2023-07-19 DIAGNOSIS — D709 Neutropenia, unspecified: Secondary | ICD-10-CM | POA: Diagnosis not present

## 2023-09-15 ENCOUNTER — Ambulatory Visit

## 2023-09-15 ENCOUNTER — Ambulatory Visit
Admission: RE | Admit: 2023-09-15 | Discharge: 2023-09-15 | Disposition: A | Discharge status: Home / Self Care | Source: Ambulatory Visit

## 2023-09-15 VITALS — BP 104/67 | HR 108 | Temp 98.0°F | Resp 19

## 2023-09-15 DIAGNOSIS — K5900 Constipation, unspecified: Secondary | ICD-10-CM

## 2023-09-15 DIAGNOSIS — B369 Superficial mycosis, unspecified: Secondary | ICD-10-CM | POA: Diagnosis not present

## 2023-09-15 LAB — URINALYSIS, W/ REFLEX TO CULTURE (INFECTION SUSPECTED)
Bilirubin Urine: NEGATIVE
Glucose, UA: NEGATIVE mg/dL
Hgb urine dipstick: NEGATIVE
Nitrite: NEGATIVE
Protein, ur: NEGATIVE mg/dL
RBC / HPF: NONE SEEN RBC/hpf (ref 0–5)
Specific Gravity, Urine: 1.015 (ref 1.005–1.030)
pH: 7 (ref 5.0–8.0)

## 2023-09-15 MED ORDER — CLOTRIMAZOLE 1 % EX CREA: TOPICAL_CREAM | Freq: Two times a day (BID) | CUTANEOUS | Status: DC

## 2023-09-15 MED ORDER — CLOTRIMAZOLE 1 % EX CREA: 1.0000 | TOPICAL_CREAM | Freq: Two times a day (BID) | CUTANEOUS | 0 refills | Status: DC

## 2023-09-15 NOTE — ED Provider Notes (Signed)
 [facility]  Note:  This document was prepared using Conservation officer, historic buildings and may include unintentional dictation errors.  MRN: [id number] DOB: 1980/07/12  Subjective:   Bradshaw Minihan is a 43 y.o. male presenting for concern for fecal impaction after previous bout of constipation.  Caregiver states that he is now having liquid stools but he is still complaining of pain when having a bowel movement which concerned her for possible rectal impaction of stool.  Patient has been given MiraLAX  and milk of magnesia which seems to have helped with constipation but now he is having liquid stools which she believes may be running around the fecal impaction.  Patient is also complaining of penile pain when she retracts his foreskin to clean.  She denies any fever, increased urinary frequency, flank pain, abdominal pain.  No current facility-administered medications for this encounter.  Current Outpatient Medications:    atorvastatin  (LIPITOR) 40 MG tablet, Take 1 tablet (40 mg total) by mouth daily at 6 PM., Disp: 30 tablet, Rfl: 2   chlorproMAZINE  (THORAZINE ) 100 MG tablet, Take 100 mg by mouth at bedtime., Disp: , Rfl:    clotrimazole (LOTRIMIN) 1 % cream, Apply 1 Application topically 2 (two) times daily., Disp: 30 g, Rfl: 0   zolpidem (AMBIEN) 10 MG tablet, Take 10 mg by mouth at bedtime as needed., Disp: , Rfl:    ammonium lactate (LAC-HYDRIN) 12 % lotion, Apply 1 application topically daily. Apply lotion to legs and chest daily after shower, Disp: , Rfl:    aspirin  81 MG chewable tablet, Chew 4 tablets (324 mg total) by mouth daily., Disp: 30 tablet, Rfl: 2   benztropine  (COGENTIN ) 1 MG tablet, Take 1 mg by mouth 2 (two) times daily., Disp: , Rfl:    bisacodyl  (DULCOLAX) 10 MG suppository, Place 10 mg rectally as needed for moderate constipation. Insert 1 suppository per rectum if no bowel movements in 48 hours, Disp: , Rfl:    chlorhexidine  (HIBICLENS ) 4 % external liquid,  Apply 1 application topically once a week. Shower from head to toe weekly, Disp: , Rfl:    chlorproMAZINE  (THORAZINE ) 25 MG tablet, Take 25 mg by mouth 2 (two) times daily., Disp: , Rfl:    chlorproMAZINE  (THORAZINE ) 50 MG tablet, Take 50 mg by mouth., Disp: , Rfl:    diazepam  (VALIUM ) 5 MG tablet, Take 1 tablet (5 mg total) by mouth every evening., Disp: 14 tablet, Rfl: 0   docusate sodium  (COLACE) 100 MG capsule, Take 100 mg by mouth 2 (two) times daily., Disp: , Rfl:    famotidine  (PEPCID ) 20 MG tablet, Take 20 mg by mouth 2 (two) times daily before a meal., Disp: , Rfl:    haloperidol  (HALDOL ) 1 MG tablet, Take 3 mg by mouth 2 (two) times daily. In the morning and at 1630, Disp: , Rfl:    miconazole  (MICOTIN) 2 % powder, Apply topically daily. Apply powder to groin, abdomen and buttocks daily after shower, Disp: , Rfl:    Miconazole  Nitrate (ALOE VESTA ANTIFUNGAL) 2 % OINT, Apply 1 application topically 2 (two) times daily as needed (itching). Apply to groin area, Disp: , Rfl:    Multiple Vitamin (MULTIVITAMIN WITH MINERALS) TABS tablet, Take 1 tablet by mouth daily., Disp: , Rfl:    Skin Protectants, Misc. (EUCERIN) cream, Apply 1 application  topically daily., Disp: , Rfl:    vitamin B-12 (CYANOCOBALAMIN ) 500 MCG tablet, Take 1,000 mcg by mouth 2 (two) times daily., Disp: , Rfl:  Vitamin D , Ergocalciferol , (DRISDOL ) 50000 units CAPS capsule, Take 50,000 Units by mouth every 7 (seven) days., Disp: , Rfl:    Allergies  Allergen Reactions   Celexa [Citalopram Hydrobromide] Other (See Comments)    Aggressive    Citalopram     Other Reaction(s): Other (See Comments)    Aggressive   Zyprexa  [Olanzapine ] Other (See Comments)    Aggressive behavior     Past Medical History:  Diagnosis Date   Autism    Down syndrome    Tetralogy of Fallot      Past Surgical History:  Procedure Laterality Date   TETRALOGY OF FALLOT REPAIR      History reviewed. No pertinent family  history.  Social History   Tobacco Use   Smoking status: Never   Smokeless tobacco: Never  Substance Use Topics   Alcohol use: Never   Drug use: Never    ROS Refer to HPI for ROS details.  Objective:   Vitals: BP 104/67 (BP Location: Right Arm)   Pulse (!) 108   Temp 98 F (36.7 C) (Oral)   Resp 19   SpO2 98%   Physical Exam Vitals and nursing note reviewed. Exam conducted with a chaperone present.  Constitutional:      General: He is not in acute distress.    Appearance: Normal appearance. He is well-developed. He is not ill-appearing or toxic-appearing.  HENT:     Head: Normocephalic.     Nose: Nose normal.     Mouth/Throat:     Mouth: Mucous membranes are moist.  Cardiovascular:     Rate and Rhythm: Normal rate.  Pulmonary:     Effort: Pulmonary effort is normal. No respiratory distress.  Abdominal:     General: There is no distension.     Palpations: Abdomen is soft. There is no mass.     Tenderness: There is no abdominal tenderness. There is no right CVA tenderness, left CVA tenderness, guarding or rebound.  Genitourinary:    Penis: Uncircumcised. Erythema and tenderness present. No discharge or swelling.      Testes: Normal.  Skin:    General: Skin is warm and dry.  Neurological:     General: No focal deficit present.     Mental Status: He is alert and oriented to person, place, and time.  Psychiatric:        Mood and Affect: Mood normal.        Behavior: Behavior normal.     Procedures  No results found for this or any previous visit (from the past 24 hours).  Assessment and Plan :     Discharge Instructions      1. Constipation, unspecified constipation type (Primary) - Continue with current constipation medication regimen to continue to help with chronic constipation symptoms.  2. Superficial fungal infection of skin - Urinalysis, w/ Reflex to Culture (Infection Suspected) collected in UC and sent to lab results still pending should be  available in 1 to 2 hours. - clotrimazole (LOTRIMIN) 1 % cream; Apply 1 Application topically 2 (two) times daily.  Dispense: 30 g; Refill: 0 -Continue to monitor symptoms for any change in severity if there is any escalation of current symptoms or development of new symptoms follow-up in ER for further evaluation and management.    Irish Piech B Yassin Scales   Enijah Furr, [place], [place] 09/15/23 1649

## 2023-09-15 NOTE — ED Triage Notes (Addendum)
 Sx have been going on for 2-3 weeks. Patient was constipated then  Patient was given milk of mag and is having diarrhea. Guardian is afraid that he has a blockage.

## 2023-09-15 NOTE — Discharge Instructions (Signed)
 1. Constipation, unspecified constipation type (Primary) - Continue with current constipation medication regimen to continue to help with chronic constipation symptoms.  2. Superficial fungal infection of skin - Urinalysis, w/ Reflex to Culture (Infection Suspected) collected in UC and sent to lab results still pending should be available in 1 to 2 hours. - clotrimazole (LOTRIMIN) 1 % cream; Apply 1 Application topically 2 (two) times daily.  Dispense: 30 g; Refill: 0 -Continue to monitor symptoms for any change in severity if there is any escalation of current symptoms or development of new symptoms follow-up in ER for further evaluation and management.

## 2023-09-18 LAB — URINE CULTURE: Culture: >=100000 — AB

## 2023-09-19 ENCOUNTER — Ambulatory Visit (HOSPITAL_COMMUNITY): Payer: Self-pay

## 2023-09-19 MED ORDER — AMOXICILLIN-POT CLAVULANATE 875-125 MG PO TABS: 1.0000 | ORAL_TABLET | Freq: Two times a day (BID) | ORAL | 0 refills | Status: DC

## 2024-01-06 DIAGNOSIS — I340 Nonrheumatic mitral (valve) insufficiency: Secondary | ICD-10-CM | POA: Diagnosis not present

## 2024-01-06 DIAGNOSIS — I361 Nonrheumatic tricuspid (valve) insufficiency: Secondary | ICD-10-CM | POA: Diagnosis not present

## 2024-01-06 DIAGNOSIS — Q213 Tetralogy of Fallot: Secondary | ICD-10-CM | POA: Diagnosis not present

## 2024-01-20 DIAGNOSIS — Q213 Tetralogy of Fallot: Secondary | ICD-10-CM | POA: Diagnosis not present

## 2024-03-25 ENCOUNTER — Ambulatory Visit
Admission: RE | Admit: 2024-03-25 | Discharge: 2024-03-25 | Disposition: A | Discharge status: Home / Self Care | Attending: Emergency Medicine | Admitting: Emergency Medicine

## 2024-03-25 VITALS — BP 120/73 | HR 97 | Temp 97.5°F | Resp 15 | Ht 63.0 in | Wt 149.9 lb

## 2024-03-25 DIAGNOSIS — R051 Acute cough: Secondary | ICD-10-CM | POA: Diagnosis not present

## 2024-03-25 DIAGNOSIS — U071 COVID-19: Secondary | ICD-10-CM | POA: Diagnosis not present

## 2024-03-25 LAB — POC SOFIA SARS ANTIGEN FIA: SARS Coronavirus 2 Ag: POSITIVE — AB

## 2024-03-25 MED ORDER — BENZONATATE 100 MG PO CAPS: 200.0000 mg | ORAL_CAPSULE | Freq: Three times a day (TID) | ORAL | 0 refills | Status: DC

## 2024-03-25 MED ORDER — PROMETHAZINE-DM 6.25-15 MG/5ML PO SYRP: 5.0000 mL | ORAL_SOLUTION | Freq: Four times a day (QID) | ORAL | 0 refills | Status: DC | PRN

## 2024-03-25 MED ORDER — IPRATROPIUM BROMIDE 0.06 % NA SOLN: 2.0000 | Freq: Four times a day (QID) | NASAL | 12 refills | Status: DC

## 2024-03-25 NOTE — ED Provider Notes (Signed)
 [facility]    CSN: [id number] Arrival date & time: 03/25/24  1057      History   Chief Complaint Chief Complaint  Patient presents with   Cough    HPI Jayce Kainz is a 43 y.o. male.   HPI  43 year old male with past medical history significant for autism, Down syndrome, and tetralogy of Fallot presents for evaluation of respiratory symptoms that started 4 days ago.  He is with a family member who indicates that she was unable to check the patient's temperature at home due to a malfunctioning thermometer.  He has had nasal congestion with green nasal discharge and a moist cough.  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 Authorizing Provider  benzonatate (TESSALON) 100 MG capsule Take 2 capsules (200 mg total) by mouth every 8 (eight) hours. 03/25/24  Yes Bernardino Ditch, NP  INVEGA SUSTENNA 234 MG/1.5ML injection Inject 234 mg into the muscle once. 03/15/24  Yes [provider]  ipratropium (ATROVENT) 0.06 % nasal spray Place 2 sprays into both nostrils 4 (four) times daily. 03/25/24  Yes Bernardino Ditch, NP  Oxcarbazepine (TRILEPTAL) 300 MG tablet Take 300 mg by mouth 2 (two) times daily. 03/15/24  Yes [provider]  promethazine-dextromethorphan (PROMETHAZINE-DM) 6.25-15 MG/5ML syrup Take 5 mLs by mouth 4 (four) times daily as needed. 03/25/24  Yes Bernardino Ditch, NP  temazepam (RESTORIL) 30 MG capsule Take 30 mg by mouth at bedtime as needed. 02/27/24  Yes [provider]   ammonium lactate (LAC-HYDRIN) 12 % lotion Apply 1 application topically daily. Apply lotion to legs and chest daily after shower    [provider]  aspirin  81 MG chewable tablet Chew 4 tablets (324 mg total) by mouth daily. 01/31/18   Laurence Bridegroom, MD  atorvastatin  (LIPITOR) 40 MG tablet Take 1 tablet (40 mg total) by mouth daily at 6 PM. 01/31/18   Laurence Bridegroom, MD  benztropine  (COGENTIN ) 1 MG tablet Take 1 mg by mouth 2 (two) times daily.    [provider]  bisacodyl  (DULCOLAX) 10 MG suppository Place 10 mg rectally as needed for moderate constipation. Insert 1 suppository per rectum if no bowel movements in 48 hours    [provider]  chlorhexidine  (HIBICLENS ) 4 % external liquid Apply 1 application topically once a week. Shower from head to toe weekly    [provider]  chlorproMAZINE  (THORAZINE ) 100 MG tablet Take 100 mg by mouth at bedtime. 09/13/23   [provider]  chlorproMAZINE  (THORAZINE ) 25 MG tablet Take 25 mg by mouth 2 (two) times daily.    [provider]  chlorproMAZINE  (THORAZINE ) 50 MG tablet Take 50 mg by mouth.    [provider]  clotrimazole  (LOTRIMIN ) 1 % cream Apply 1 Application topically 2 (two) times daily. 09/15/23   Reddick, Johnathan B, NP  diazepam  (VALIUM ) 5 MG tablet Take 1 tablet (5 mg total) by mouth every evening. 01/31/18  Laurence Bridegroom, MD  docusate sodium  (COLACE) 100 MG capsule Take 100 mg by mouth 2 (two) times daily.    [provider]  famotidine  (PEPCID ) 20 MG tablet Take 20 mg by mouth 2 (two) times daily before a meal.    [provider]  haloperidol  (HALDOL ) 1 MG tablet Take 3 mg by mouth 2 (two) times daily. In the morning and at 1630    [provider]  miconazole  (MICOTIN) 2 % powder Apply topically daily. Apply powder to groin, abdomen and buttocks daily after shower    [provider]  Miconazole  Nitrate (ALOE VESTA ANTIFUNGAL) 2 % OINT Apply 1 application  topically 2 (two) times daily as needed (itching). Apply to groin area    [provider]  Multiple Vitamin (MULTIVITAMIN WITH MINERALS) TABS tablet Take 1 tablet by mouth daily.    [provider]  Skin Protectants, Misc. (EUCERIN) cream Apply 1 application  topically daily.    [provider]  vitamin B-12 (CYANOCOBALAMIN ) 500 MCG tablet Take 1,000 mcg by mouth 2 (two) times daily.    [provider]  Vitamin D , Ergocalciferol , (DRISDOL ) 50000 units CAPS capsule Take 50,000 Units by mouth every 7 (seven) days.    [provider]    Family History History reviewed. No pertinent family history.  Social History Social History   Tobacco Use   Smoking status: Never   Smokeless tobacco: Never  Vaping Use   Vaping status: Never Used  Substance Use Topics   Alcohol use: Never   Drug use: Never     Allergies   Celexa [citalopram hydrobromide], Citalopram, and Zyprexa  [olanzapine ]   Review of Systems Review of Systems  Constitutional:  Negative for fever.  HENT:  Positive for congestion and rhinorrhea.   Respiratory:  Positive for cough. Negative for shortness of breath and wheezing.      Physical Exam Triage Vital Signs ED Triage Vitals  Encounter Vitals Group     BP      Girls Systolic BP Percentile      Girls Diastolic BP Percentile      Boys Systolic BP Percentile      Boys Diastolic BP Percentile      Pulse      Resp      Temp      Temp src      SpO2      Weight      Height      Head Circumference      Peak Flow      Pain Score      Pain Loc      Pain Education      Exclude from Growth Chart    No data found.  Updated Vital Signs BP 120/73 (BP Location: Right Arm)   Pulse 97   Temp (!) 97.5 F (36.4 C) (Temporal)   Resp 15   Ht 5' 3 (1.6 m)   Wt 149 lb 14.6 oz (68 kg)   SpO2 97%   BMI 26.56 kg/m   Visual Acuity Right Eye Distance:   Left Eye Distance:   Bilateral Distance:    Right Eye Near:    Left Eye Near:    Bilateral Near:     Physical Exam Vitals and nursing note reviewed.  Constitutional:      Appearance: Normal appearance. He is not ill-appearing.  HENT:     Head: Normocephalic and atraumatic.     Right Ear: Tympanic membrane, ear canal  and external ear normal. There is no impacted cerumen.     Left Ear: Tympanic membrane, ear canal and external ear normal. There is no impacted cerumen.     Nose: Congestion and rhinorrhea present.     Comments: Nasal mucosa is edematous and erythematous with clear discharge in both nares.    Mouth/Throat:     Mouth: Mucous membranes are moist.     Pharynx: Oropharynx is clear. Posterior oropharyngeal erythema present. No oropharyngeal exudate.     Comments: Erythema to the posterior pharynx with clear postnasal drip. Cardiovascular:     Rate and Rhythm: Normal rate and regular rhythm.     Pulses: Normal pulses.     Heart sounds: Normal heart sounds. No murmur heard.    No friction rub. No gallop.  Pulmonary:     Effort: Pulmonary effort is normal.     Breath sounds: Normal breath sounds. No wheezing, rhonchi or rales.  Musculoskeletal:     Cervical back: Normal range of motion and neck supple. No tenderness.  Lymphadenopathy:     Cervical: No cervical adenopathy.  Skin:    General: Skin is warm and dry.     Capillary Refill: Capillary refill takes less than 2 seconds.     Findings: No erythema or rash.  Neurological:     General: No focal deficit present.     Mental Status: He is alert and oriented to person, place, and time.      UC Treatments / Results  Labs (all labs ordered are listed, but only abnormal results are displayed) Labs Reviewed  POC SOFIA SARS ANTIGEN FIA - Abnormal; Notable for the following components:      Result Value   SARS Coronavirus 2 Ag Positive (*)    All other components within normal limits    EKG   Radiology No results found.  Procedures Procedures (including critical care  time)  Medications Ordered in UC Medications - No data to display  Initial Impression / Assessment and Plan / UC Course  I have reviewed the triage vital signs and the nursing notes.  Pertinent labs & imaging results that were available during my care of the patient were reviewed by me and considered in my medical decision making (see chart for details).   Patient is a nontoxic-appearing 43 year old male presenting for evaluation of 4 days with the respiratory symptoms as outlined in the HPI above.  His exam does reveal inflammation of his nasal mucosa with clear discharge in both nares as well as erythema to the posterior pharynx with clear postnasal drip.  No cervical lymphadenopathy present.  Cardiopulmonary exam physical lung sounds in all fields.  Differential diagnose include COVID, influenza, viral respiratory illness.  Due to the fact that he is on day 4 symptoms I will not test him for influenza at this time but I will order a COVID antigen test.  COVID antigen test is positive.  I will discharge patient on the diagnosis of COVID-19.  I will prescribe Atrovent nasal spray for his nasal congestion I will Tessalon Perles and Promethazine DM cough syrup for cough and congestion.  Return and ER precautions reviewed.   Final Clinical Impressions(s) / UC Diagnoses   Final diagnoses:  Acute cough  COVID-19     Discharge Instructions      CDC guidelines state that you must wear a mask for the first 5 days of symptoms when you are around other people.  After 5 days you no longer need to  mask as you are no longer considered infectious.  There is no longer need to quarantine unless you have a fever.  If you do have a fever then you need to quarantine until you have been fever free for 24 hours without taking Tylenol  and/or ibuprofen.  Use over-the-counter Tylenol  and/or ibuprofen according to the package instructions as needed for fever and pain.  Use the Atrovent  nasal spray, 2 squirts  up each nostril every 6 hours, as needed for nasal congestion and runny nose.  Use the Tessalon  Perles every 8 hours during the day as needed for cough.  Take them with a small sip of water.  You may experience numbness to the base of your tongue or metallic taste in her mouth, this is normal.  Use the Promethazine  DM cough syrup at bedtime as needed for cough and congestion.  Be mindful this medication will make you sleepy.  If you develop any worsening respiratory symptoms such as shortness of breath, shortness of breath at rest, feel as though you cannot catch your breath, you are unable to speak in full sentences, or, as a late sign, your lips begin turning blue you need to call 911 and go to the ER for evaluation.      ED Prescriptions     Medication Sig Dispense Auth. Provider   benzonatate  (TESSALON ) 100 MG capsule Take 2 capsules (200 mg total) by mouth every 8 (eight) hours. 21 capsule Bernardino Ditch, NP   ipratropium (ATROVENT ) 0.06 % nasal spray Place 2 sprays into both nostrils 4 (four) times daily. 15 mL Bernardino Ditch, NP   promethazine -dextromethorphan (PROMETHAZINE -DM) 6.25-15 MG/5ML syrup Take 5 mLs by mouth 4 (four) times daily as needed. 118 mL Bernardino Ditch, NP      PDMP not reviewed this encounter.   Bernardino Ditch, NP 03/25/24 1150

## 2024-03-25 NOTE — ED Triage Notes (Signed)
 Patient has been having chest congestion, cough, nasal congestion since Wed.  Unable to check patient's temperature.

## 2024-03-25 NOTE — Discharge Instructions (Addendum)
 CDC guidelines state that you must wear a mask for the first 5 days of symptoms when you are around other people.  After 5 days you no longer need to mask as you are no longer considered infectious.  There is no longer need to quarantine unless you have a fever.  If you do have a fever then you need to quarantine until you have been fever free for 24 hours without taking Tylenol and/or ibuprofen.  Use over-the-counter Tylenol and/or ibuprofen according to the package instructions as needed for fever and pain.  Use the Atrovent nasal spray, 2 squirts up each nostril every 6 hours, as needed for nasal congestion and runny nose.  Use the Tessalon Perles every 8 hours during the day as needed for cough.  Take them with a small sip of water.  You may experience numbness to the base of your tongue or metallic taste in her mouth, this is normal.  Use the Promethazine DM cough syrup at bedtime as needed for cough and congestion.  Be mindful this medication will make you sleepy.  If you develop any worsening respiratory symptoms such as shortness of breath, shortness of breath at rest, feel as though you cannot catch your breath, you are unable to speak in full sentences, or, as a late sign, your lips begin turning blue you need to call 911 and go to the ER for evaluation.

## 2024-04-17 NOTE — Consults (Signed)
 Speech Language Pathology Clinical Swallow Assessment Evaluation (04/17/24 0945)  Patient Name:  Derrick Marshall      Medical Record Number: [id number]  Date of Birth: 02-May-1980 Sex: Male          SLP Treatment Diagnosis:  (r/o dysphagia)   Activity Tolerance: Patient tolerated treatment well  Assessment  Pt seen for clinical swallow evaluation. Pt's sister at the bedside throughout and provided social history. Sister denied pt history of swallow difficulty prior to admission. Sister reported speech intelligibility was at pt's baseline. Vocal quality was mildly dysphonic (hoarse).  Pt followed basic commands throughout. Pt had baseline strong cough. Oral motor evaluation was unremarkable. Oral acceptance, mastication, manipulation, and transit appeared to be intact. Delayed cough x1 observed with thin liquid and puree trials however, low frequency in comparison to number of trials. No other overt s/sx of aspiration observed across consistencies trialed.  Given clinical presentation, recommend cautious initiation of regular diet with thin liquids, meds with sips as tolerated. General aspiration precautions apply: upright during PO intake, small bites/sips, slow rate. Pt may benefit from 1:1 feeding assist and close clinical monitoring. No further acute SLP needs identified at this time, will sign off. Please re-consult as indicated.   Risk for Aspiration: Mild   Recommendations:  PO Diet, Frequent Oral Care        Diet Liquids Recommendations: Thin Liquids, Level 0, No Restrictions   Diet Solids Recommendation: Regular Consistency Solids   Recommended Form of Medications: Whole, Crushed, With liquid, With puree (as indicated)    Recommended Compensatory Techniques : Slow rate, Small sips/bites, Upright 90 degrees, Monitor signs of aspiration, One to one assist with meals  Post Acute Discharge Recommendations Post Acute SLP Discharge Recommendations: Skilled SLP services are NOT  indicated  Prognosis: Good Positive Indicators: participation in therapeutic tasks; clinical presentation; support Barriers to Discharge: Endurance deficits, Functional strength deficits, Impaired Balance, Gait instability, Inability to safely perform ADLS, Severity of deficits   Plan of Care SLP Daily Frequency: D/C Services, D/C Services    Treatment Goals:    Patient and Family Goal: presumbed safely consume unrestricted diet  Subjective Medical Updates Since Last Visit/Relevant PMH Affecting Clinical Decision Making: Derrick Marshall is a 43 y.o. male with Down syndrome, tetralogy of fallot s/p repair (1984), new RV dysfunction, severe intellectual disability, hx of aggression, ASD, ADHD who is presenting to [facility] from sister's house with dyspnea, cough, falls, lethargy and found to have sepsis 2/2 CAP.     In step-down, he became hypoxic with sats to 84, febrile, tachycardic, and tachypneic with intermittent hypotension and AMS. Rapid was called and patient was placed on non-rebreather and levophed  was started. On transfer to [facility], he had increasing hypotension with MAPs in the 50s, increased work of breathing. His presentation required intubation due to inability to protect his airway. Intubated 12/17- 12/21. Currently NPO. Seen today for a clinical swallow evaluation.     Communication Preference: Verbal Patient/Caregiver Reports: Sister reported no prvious swallow difficulty prior to admission Pain: no s/s of pain    Allergies: Ceftriaxone, Citalopram, and Olanzapine  Current Medications[1] Past Medical History[2] Family History[3] Past Surgical History[4] Social History   Tobacco Use   Smoking status: Never   Smokeless tobacco: Never  Substance Use Topics   Alcohol use: No     General: Hearing Exceptions: None                Self-Feeding Capacity: Impaired for self-feeding  Medical Tests / Procedures Comments: 12/22 RKM:Pfemncpwh bilateral airspace and  interstitial opacities, representing edema versus pneumonia.    Decreased small bilateral pleural effusions. 12/15 CTA Chest: Impression  No pulmonary embolism.    Sequelae of tetralogy of Fallot repair. 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    Bilateral interstitial and airspace opacities may represent pulmonary edema or pneumonia.    Small left and trace right pleural effusions adjacent atelectasis. 12/15 CTH:No acute intracranial abnormalities. Equipment/Environment: Supplemental oxygen , Vascular access (PIV, TLC, Port-a-cath, PICC, Telemetry Services patient receives prior to admission: SLP  Precautions / Restrictions Precautions: Falls precautions, Delirium Precautions Required Braces or Orthoses: Non-applicable  Objective   Respiratory Status : Room air (large bore 5L) History of Intubation: Yes Length of Intubations (days): 3 days Date extubated: 04/16/24  Behavior/Cognition: Alert, Cooperative Positioning : Upright in bed  Oral / Motor Exam Vocal Quality: Dysphonic (hoarse) Volitional Swallow: Within Functional Limits  Labial ROM: Within Functional Limits  Labial Symmetry: Within Functional Limits Labial Strength: Within Functional Limits    Lingual Symmetry: Within Functional Limits    Lingual Sensation: Within Functional Limits    Mandible: Within Functional Limits   Facial ROM: Within Functional Limits  Facial Symmetry: Within Functional Limits Facial Strength: Within Functional Limits    Vocal Intensity: Within Functional Limits     Intelligibility: Intelligibility reduced (baseline)  Breath Support: Adequate for speech  Dentition: Some missing teeth  Consistencies assessed: ice chip, thin liquids, puree, regular solid  Patient at end of session: All needs in reach, Friends/Family present, In bed, Lines intact, Notified Nurse, Notified Provider  Speech Therapy Session Duration SLP Individual [mins]: 18  I  attest that I have reviewed the above information. Signed: Royal GORMAN Butler, SLP  Filed 04/17/2024       [1] Current Facility-Administered Medications  Medication Dose Route Frequency Provider Last Rate Last Admin   acetaminophen  (TYLENOL ) tablet 1,000 mg  1,000 mg Oral Q6H PRN Stuart Comer Primus, MD   1,000 mg at 04/16/24 9567   albuterol  2.5 mg /3 mL (0.083 %) nebulizer solution 2.5 mg  2.5 mg Nebulization 4x Daily (RT) Tess Ronnald HERO, MD   2.5 mg at 04/17/24 9178   cetirizine (ZYRTEC) tablet 10 mg  10 mg Oral BID Kapoor, Ishani, MD   10 mg at 04/15/24 2038   [Provider Hold] chlorproMAZINE  (THORAZINE ) tablet 50 mg  50 mg Oral BID PRN Daune Cha, MD   50 mg at 04/11/24 1646   dextrose  50 % in water (D50W) 50 % solution 12.5 g  12.5 g Intravenous Q15 Min PRN Dorise Hove, MD       enoxaparin  (LOVENOX ) syringe 40 mg  40 mg Subcutaneous Q24H Daune Cha, MD   40 mg at 04/16/24 2135   flu vaccine TS 2025-26 (Fluarix,Flulaval,Fluzone) (6mos up)(PF) 45 mcg(15mcgx3)/0.5 mL IM syringe  0.5 mL Intramuscular During hospitalization [place], Torrie Stuart, MD       glucagon injection 1 mg  1 mg Intramuscular Once PRN Dorise Hove, MD       glucose chewable tablet 16 g  16 g Oral Q10 Min PRN Dorise Hove, MD       [Provider Hold] LORazepam  (ATIVAN ) tablet 2 mg  2 mg Oral Nightly Kapoor, Ishani, MD   2 mg at 04/15/24 2038   OXcarbazepine  (TRILEPTAL ) tablet 300 mg  300 mg Oral BID Kapoor, Ishani, MD   300 mg at 04/15/24 2038   oxymetazoline (AFRIN) 0.05 % nasal spray  3 spray  3 spray Each Nare BID Dorise Hove, MD   3 spray at 04/16/24 2000   pantoprazole (Protonix) EC tablet 40 mg  40 mg Oral Daily before breakfast Kapoor, Ishani, MD       pediatric multivitamin-iron chewable tablet 1 tablet  1 tablet Oral BID Kapoor, Ishani, MD   1 tablet at 04/15/24 2155   piperacillin-tazobactam (ZOSYN) 3.375 g in sodium chloride  0.9 % (NS) 100 mL IVPB-MBP  3.375 g Intravenous Q6H  [facility] Tess Ronnald HERO, MD   Stopped at 04/17/24 [phone]   polyethylene glycol (MIRALAX ) packet 17 g  17 g Oral TID Kapoor, Ishani, MD       senna NALANI) tablet 2 tablet  2 tablet Oral Nightly Kapoor, Ishani, MD   2 tablet at 04/16/24 2148   vancomycin  (VANCOCIN ) 1250 mg in sodium chloride  (NS) 0.9% 250 mL IVPB  1,250 mg Intravenous Q12H Kapoor, Ishani, MD       Facility-Administered Medications Ordered in Other Encounters  Medication Dose Route Frequency Provider Last Rate Last Admin   EPINEPHrine 100 mcg/10 mL (10 mcg/mL) injection   Intravenous PRN one-step-med Hasty Peggins, Geryn T, MD   10 mcg at 04/11/24 2350   phenylephrine 1 mg/10 mL (100 mcg/mL) injection Syrg   Intravenous PRN one-step-med Hasty Peggins, Geryn T, MD   200 mcg at 04/11/24 2351   Propofol  (DIPRIVAN ) injection   Intravenous PRN one-step-med Hasty Peggins, Geryn T, MD   50 mg at 04/11/24 2345   ROCuronium (ZEMURON) injection   Intravenous PRN one-step-med Hasty Peggins, Altha DASEN, MD   100 mg at 04/11/24 2345  [2] Past Medical History: Diagnosis Date   Developmental delay    Down syndrome ([facility])    History of artificial heart valve    Tetralogy of Fallot ([facility])   [3] Family History Adopted: Yes  [4] Past Surgical History: Procedure Laterality Date   CARDIAC SURGERY

## 2024-04-23 NOTE — Progress Notes (Signed)
 ------------------------------------------------------------------------------- Attestation signed by Marshall Derrick Smalls, MD at 04/23/24 2143 Teaching Physician Attestation:   Derrick Marshall is a 43 y.o. 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 I independently spent 35 minutes, excluding procedures, in critical care time examining the patient, evaluating the hemodynamic, laboratory, and radiographic data, developing a comprehensive management plan, and serially assessing the patient's response to our critical care interventions. Total billable critical care time 35 minutes.  Derrick JAYSON Leisa, MD -------------------------------------------------------------------------------     MICU Daily Progress Note   Date of Service: 04/23/2024  Problem List:  Principal Problem:   Acute hypoxic respiratory failure    (CMS-HCC) Active Problems:   Down's syndrome (HHS-HCC)   ADHD (attention deficit hyperactivity disorder)   Aggression   Intellectual disability   Autistic disorder (HHS-HCC)   CAP (community acquired pneumonia)    Bicytopenia   Fever   Interval history: Derrick Marshall is a 43 y.o. male with Down syndrome, tetralogy of fallot s/p repair (1984), new RV dysfunction, severe intellectual disability, hx of aggression, ASD, ADHD who is presenting to [facility] from sister's house with dyspnea, cough, falls, lethargy and found to have sepsis 2/2 CAP.  In step-down, he became hypoxic with sats to 84, febrile, tachycardic, and tachypneic with intermittent hypotension and AMS. Rapid was called and patient was placed on non-rebreather and levophed  was started. On transfer to [facility], he had increasing hypotension with MAPs in the 50s, increased work of breathing. His presentation required intubation due to inability to protect his airway. Intubated successfully with A-line and central lines placed. Extubated 12/21, now mentating at baseline and tolerating PO intake. Intermittently required pressors likely secondary to volume status, weaned off with IV fluids. Intermittent low grade fevers likely secondary to viral upper respiratory infection vs chronic aspiration.  24h events: - Floor status - CPAP overnight for sleep apnea - Afebrile - Tolerating PO, having BM and good UOP   Neurological  S/p Sedation - Off precedex  12/21  Altered Mental Status  Acute metabolic encephalopathy, due to sepsis Multiple centrally acting medications History of Aggression Has had fluctuating altered mental status since rapid event on 12/17.  Confusion at that time could have been secondary to centrally acting medications that he received including Thorazine , Restoril  (temazepam ), benztropine .  Prior to acutely altered mental status, metabolic encephalopathy secondary to sepsis had been improving. Mental status at baseline after extubation. - HOLD home benztropine  1 mg BID iso sedation - HOLD Thorazine  iso sedation and only PRN use for uncontrolled aggression - Ativan  1mg  - in place of home temazepam , decreased dose iso  somnolence    Analgesia: Pain adequately controlled  RASS at goal? N/A, not on sedation [place] Agitation Assessment Scale (RASS) : -1 (04/23/2024  4:00 AM)    Pulmonary  Acute hypoxic respiratory failure  sepsis  pneumonia (improving) Patient with increased work of breathing, tachypnea, new increased oxygen  need. Etiology could be secondary to community-acquired pneumonia vs pulmonary edema vs aspiration pneumonia. Bronch 12/18 with 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  - Vancomycin  and Meropenem narrowed to CTX/Azith for CAP coverage 12/18; changed to Levofloxacin  12/20 given concern for antibiotic related rash with CTX/Azith prior, finished 7 days of antibiotics on 12/21 - on [place], wean O2 as tolerated - IV Lasix  40 x1 12/29  Sleep apnea Tracheobronchomalacia Given collapsible airways seen on bronchoscopy, patient likely had obstructive sleep apnea. Treating with BiPAP overnight, which patient has tolerated well. - Continue BiPAP overnight - Patient would benefit from continued NIV therapy - Non-invasive ventilation attestation: This patient has severe chronic respiratory failure due to neuromuscular weakness due to Trisomy 21. BiPAP has been considered and ruled out as it lacks the ability to adequately manage this patient's respiratory disease as it does not offer a volume targeted therapy. I am ordering NIV therapy because of its volume targeting AVAPS AE mode and faster responding AVAPS rate. The severity of this patient's disease is such that failure to provide noninvasive ventilator therapy on a daily basis will likely lead to future hospital re-admissions and life threatening situations. Neuromuscular: Ordering NIV with AVAPS-AE as opposed to BiPAP S/T or BiPAP-AVAPS as patient has respiratory failure secondary to neuromuscular weakness and will require advanced pressure ranges and real time monitoring to ensure hypoventilatory events do not occur. This patient will  benefit from portability and the availability of ventilatory support with a backup battery to utilize with mobility during the day and to use for mouthpiece ventilation as needed. These features are not available on a BiPAP or RAD device.   O2 Device: Nasal cannula O2 Flow Rate (L/min):  [2.5 L/min-3 L/min] 2.5 L/min  Cardiovascular  Mildly reduced RV dysfunction  Tetralogy of fallot s/p repair Mild pulmonary insufficiency could be 2/2 congenital cardiac disease vs possible pulmonary regurgitation.  Bedside POCUS with cardiology they continue to show RV dysfunction but no immediate concerns for LV function. - Cardiology consulted, appreciate recs. Unlikely that mild RV dysfunction seen on echo was contributing to patient's AHRF. - Diuresis as above - Strict I/O - Continuous telemetry monitoring - TEE 12/18: right ventricle is mildly to moderately dilated in size, with mildly reduced systolic function, mild TR and mild PR  Septic Shock (resolved) Hypotension Patient has ongoing hypotension while sleeping, causing intermittent restarting of pressors. Multiple infectious workup have been repeated, see ID section below. Intermittently has required pressors overnight for hypotension and low MAPs, blood pressures are normal while awake during the day. Off pressors since 12/25 AM. AM cortisol level WNL. Softer blood pressures only while patient is sleeping and immediately return to normal once patient awake, suspect this is patient's underlying physiology. - MAP goal > 65 - Careful consideration of IVF and volume status for hypotension given patient's cardiac hx (see above problem) - Encourage PO intake, poor nutritional status may be contributing to hypotension - Consider re-engaging SLP to reinforce aspiration precautions - PT/OT consult  Renal  AKI (resolved) Likely multifactorial - CTM with daily BMP  Infectious Disease/Autoimmune  Aspiration pneumonia Viral URI Completed 7 day course of  antibiotics on 12/20. Continues to have ongoing fevers since stopping antibiotics. CXR notable for improving bilateral airspace and  interstitial opacities, representing edema versus pneumonia. Will start aspiration pna abx per ID recs.  - Infectious workup: 12/22, 12/24, 12/26 blood cultures NGTD, UA non-infectious - Unasyn  x 5d (EOT 12/31)  Aspergillus antigen positive, no c/f active infection Cultures from bronchoscopy on 12/18 resulted in aspergillus antigen positive. Patient had a bloody bronch that may have resulted in false positive. Discussed with ID pharmacist, who noted that IDSA guidelines utilize a threshold of 1 for positivity while others use 0.5 as the threshold (our lab). Patient does intermittently have fevers and upper respiratory congestion symptoms, though no concerning findings on CXR or prior CTA chest imaging. 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 - Infectious disease consulted, appreciate recs. Per ID, unlikely to be aspergillus given no risk factors (not on steroids or immunocompromised). - Repeat CT chest, no evidence of pulmonary infectious process. Noted volume overload and possible superimposed pneumonia - Fungitell assay negative   Cultures: Blood Culture, Routine (no units)  Date Value  04/20/2024 No Growth at 48 hours  04/20/2024 No Growth at 48 hours   Urine Culture, Comprehensive (no units)  Date Value  04/20/2024 <10,000 CFU/mL  04/18/2024 NO GROWTH   Lower Respiratory Culture (no units)  Date Value  04/12/2024 Specimen Not Processed   WBC (10*9/L)  Date Value  04/22/2024 5.0   WBC, UA (/HPF)  Date Value  04/20/2024 4 (H)      FEN/GI  Constipation X-ray abdomen 12/17 with marked colonic stool burden. Subsequent KUBs noting interval resolution of gaseous distension of stomach and moderate colonic stool burden. - continue bowel regimen to ensure consistent BMs   Urinary retention Thought to be possibly due to  constipation. Foley placed 12/24 with 1L of urine returned and mild bladder stretch injury.  - Removed foley in 7-10 days (~12/31) with TOV  - Recommend holding anticholinergic mediations 24-48h prior to TOV  Provider Malnutrition Assessment: Body mass index is 32.42 kg/m. BMI Interpretation: >/= 30 and < 40, consistent with obesity, clinically significant requiring additional resources and complicating multiple aspects of patient care. GLIM criteria:  Pt does not meet criteria -I have screened this patient for malnutrition and they did NOT meet criteria for malnutrition based on GLIM criteria. -Nutrition consulted no RD assessment:Not done yet. Patient does not meet AND/ASPEN criteria for malnutrition at this time (04/16/24 1531)     Heme/Coag  Macrocytic anemia  Thrombocytopenia Uncertain etiology but initially thought to be secondary to sepsis versus nutrition versus medication-related.  - serial CBCd; transfuse for Hgb goal > 7 - Per Hematology 12/19: While patients with trisomy 21 are at increased risk for myeloid disease, he did not have any evidence of clonal hematopoesis on recent myeloid panel. No abnormal immature/dysplastic cells in the periphery, and suspect his more acute cytopenias and in setting of critical illness. Note carbamazepine, which was started more recently, could also be play a role. Advise continued closer monitoring and supportive care; if counts do not recover despite clinical improvement, then could consider further bone marrow investigation.  - last transfusion 1u pRBC 12/24 - Hold off on IV iron given elevated ferritin 573   Endocrine  NAI  Integumentary  #Exanthematous medication reaction New 12/20. Unclear etiology. Received EpiPen x 1 and started on Solumedrol. No eosinophilia. Rash steadily improving since 12/22. - Discontinued Cetirizine for somnolence - Derm consulted for persistent rash, appreciate recs - Triamcinolone cream BID PRN for  itching - antibiotics changed as above. Per Derm, if patient requires cephalosporins in future  it is reasonable to consider treating through as this is a self resolving rash after a few weeks. - holding further steroids  # - WOCN consulted for high risk skin assessment No. Reason: n/a. - WOCN recs >> n/a - cont pressure mitigating precautions per skin policy  Prophylaxis/LDA/Restraints/Consults  ICU Checklist completed:  see ICU rounding navigator   Patient Lines/Drains/Airways Status     Active Active Lines, Drains, & Airways     Name Placement date Placement time Site Days   Urethral Catheter 04/18/24  0930  --  4   Peripheral IV 04/16/24 Anterior;Left Forearm 04/16/24  1505  Forearm  6   Peripheral IV 04/18/24 Anterior;Right Forearm 04/18/24  0530  Forearm  5           Patient Lines/Drains/Airways Status     Active Wounds     None            Goals of Care   Code Status:  Orders Placed This Encounter  Procedures   Full Code    Standing Status:   Standing    Number of Occurrences:   1     Librarian, Academic: Mr. [employer] designated healthcare decision maker(s) is/are   HCDM (patient stated preference) (Active): Montgomery,Linda - Sister - [phone]. See HCDM section of Epic sidebar/storyboard or ACP tab in patient chart for details regarding active HCDMs and patient capacity for decision-making.   Subjective   See 24h events  Objective   Vitals - past 24 hours Temp:  [36.8 C (98.2 F)-37.5 C (99.5 F)] 36.8 C (98.2 F) Pulse:  [98-110] 101 SpO2 Pulse:  [98-110] 101 Resp:  [16-25] 23 BP: (99-139)/(58-81) 99/65 SpO2:  [86 %-100 %] 100 % Intake/Output I/O last 3 completed shifts: In: 863.3 [P.O.:400; I.V.:80; IV Piggyback:383.3] Out: 2346 [Urine:2345; Emesis/NG output:1]   Physical Exam:   General: NAD. Awakens to voice, conversant, follows commands. HEENT: Moist mucous membranes CV: Regular rate and rhythm Pulm: CTA  bilaterally anteriorly. GI: mild-distension, non-tender to palpation. Skin: Diffuse mild pink macules and papules across chest, abdomen, improving from prior days. Extremities: 1+ lower extremity edema. Puffiness of bilateral hands Neuro: Awake, alert. Follows commands, conversing. Moving all extremities spontaneously.    Continuous Infusions:  Infusions Meds[1]  Scheduled Medications:  Scheduled Medications[2]  PRN medications: PRN Medications[3]  Data/Imaging Review: Reviewed in Epic and personally interpreted on 04/23/2024. See EMR for detailed results.  Ishani Kapoor, MD Internal Medicine PGY1        [1]  NORepinephrine  bitartrate-NS Stopped (04/19/24 1455)  [2]  albuterol   2.5 mg Nebulization TID (RT)   ampicillin -sulbactam  3 g Intravenous Q6H SCH   enoxaparin  (LOVENOX ) injection  40 mg Subcutaneous Q24H   flu vac ts 2025-26(57mos up)-PF  0.5 mL Intramuscular During hospitalization   LORazepam   1 mg Oral Nightly   nystatin   1 Application Topical BID   OXcarbazepine   300 mg Oral BID   pantoprazole  40 mg Oral Daily before breakfast   pediatric multivitamin-iron  1 tablet Oral BID   polyethylene glycol  17 g Oral TID   senna  2 tablet Oral Nightly   zinc sulfate  220 mg Oral Daily  [3] acetaminophen , albuterol , [Provider Hold] chlorproMAZINE , dextrose  in water, glucagon, glucose, ondansetron , triamcinolone

## 2024-04-26 NOTE — Care Plan (Signed)
 Shift Summary Furosemide  was administered at 10:03 AM, and staff provided a full bath and oral care at 11:00 AM. Frequent repositioning, pressure reduction, and skin protection interventions were maintained throughout the shift, with no new skin issues documented. Bed alarm and scheduled toileting were consistently used, and no falls or injuries occurred during the shift. MAP and SpO2 remained within acceptable ranges, and no acute changes in condition were noted during the shift. Overall, remained dependent for ADLs and mobility, with stable skin and safety status throughout the shift.

## 2024-04-26 NOTE — Progress Notes (Signed)
 ------------------------------------------------------------------------------- Attestation signed by [name], [name], MD at 04/27/24 [phone] Teaching Physician Note   44 y.o. male who is presenting to [facility] with Acute hypoxic respiratory failure    (CMS-HCC) and sepsis 2/2 CAP, in the setting of the following pertinent/contributing co-morbidities: Down syndrome, tetralogy of fallot s/p repair (1984), new RV dysfunction, severe intellectual disability, hx of aggression, ASD, ADHD.   Aspiration Low grade fever overnight S/p Unasyn  (12/26-12/30); restart Unasyn  (12/31-)  Repeat infectious workup:              - CXR negative for infection             - Follow-up blood cultures             - Obtain repeat UA + UCX AMS - Restart home benztropine  at decreased dose of 0.5 mg twice daily             - Was previously held in setting of patient's drowsy mental status and desire to avoid further confounding sedation with central acting medications, however patient's family has emphasized importance of remaining on benztropine  and adverse effects of psychosis if taken off - Continue Thorazine  PRN for agitation (PO and IV option if unable to take PO)              - DO NOT administer Zyprexa  or Haldol  for agitation, patient's psych meds have been carefully trialed outpatient and has very adverse effects to antipsychotics that are not Thorazine  - Continue oxcarbazepine  300 mg twice daily for mood stabilization - Continue Ativan  1mg  nightly - in place of home temazepam , decreased dose iso somnolence Urinary retention/penile swelling Consider urology consult tomorrow if appears to be retaining.  Improved since being in ICU, appears to be third spacing- diuresis Sleep apnea Bipap overnight Discuss with RT about what options would be (?AVAPs would require move to stepdown to titrate)  I saw and evaluated the patient, participating in the key portions of service.  I personally reviewed this patient's  microbiology, laboratory, and radiology data independently and I agree with the findings as reported. I agree with the findings and plan as documented by the resident in the note.   Care for a suspected or confirmed infection was provided by an ID specialist in this encounter. [phone])  Dorn JINNY Hearing, MD  [facility] Division of Infectious Diseases    -------------------------------------------------------------------------------  Infectious Disease ([facility]) Progress Note  Assessment & Plan:  Derrick Marshall is a 44 y.o. male who is presenting to [facility] with Acute hypoxic respiratory failure    (CMS-HCC) and sepsis 2/2 CAP, in the setting of the following pertinent/contributing co-morbidities: Down syndrome, tetralogy of fallot s/p repair (1984), new RV dysfunction, severe intellectual disability, hx of aggression, ASD, ADHD.  Principal Problem:   Acute hypoxic respiratory failure    (CMS-HCC) Active Problems:   Down's syndrome (HHS-HCC)   ADHD (attention deficit hyperactivity disorder)   Aggression   Intellectual disability   Autistic disorder (HHS-HCC)   CAP (community acquired pneumonia)   Bicytopenia   Fever  Active Problems  # Chronic Aspiration Pneumonia  # Acute Hypoxic Respiratory Failure (resolved) # Septic Shock 2/2 Community Acquired Pneumonia (resolved) Patient presented with increased work of breathing, tachypnea, new increased oxygen  need. Etiology could be secondary to community-acquired pneumonia vs pulmonary edema vs aspiration pneumonia. Bronch 12/18 with 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 Vancomycin  and Meropenem narrowed to CTX/Azithromycin for CAP coverage 12/18; changed to Levofloxacin  12/20 given concern for antibiotic related rash with  CTX/Azith prior, finished 7 days of antibiotics on 12/21. However, patient continued to have fevers with recurrent hypotension overnight and broad spectrum antibiotics were restarted. Infectious disease consulted  and recommended treatment for chronic aspiration pneumonia as they thought this may explain recurrent fevers. Transferred to ID service on 12/29. Breathing comfortably and satting 100% on 3L [place], later transition to room air.  Had fever 100.7 overnight on 12/31 that improved with Tylenol .  WBC count still normal.  UA on 12/30 not infectious.  No other fevers during day.  Patient's family wondering if restarting home Thorazine  day prior caused fever. - S/p Unasyn  (12/26-12/30); restart Unasyn  (12/31-)  - Repeat infectious workup:   - CXR negative for infection  - Bcx x2 (12/31): NG 24 hrs  - Obtain repeat UA + UCX   # Mildly Reduced RV dysfunction  # Tetralogy of Fallot s/p Repair Mild pulmonary insufficiency could be 2/2 congenital cardiac disease vs possible pulmonary regurgitation.  Bedside POCUS with cardiology they continue to show RV dysfunction but no immediate concerns for LV function. TEE 12/18: right ventricle is mildly to moderately dilated in size, with mildly reduced systolic function, mild TR and mild PR. Cardiology consulted and ruled it unlikely that mild RV dysfunction seen on echo was contributing to patient's AHRF. - Start diuresis with IV Lasix  20 x1 given continued signs of volume overload on exam - Strict I/O  # Altered Mental Status  Acute Metabolic Encephalopathy # Multiple centrally acting medications # History of Aggression Has had fluctuating altered mental status since rapid event on 12/17.  Confusion at that time could have been secondary to centrally acting medications that he received including Thorazine , Restoril  (temazepam ), benztropine . Prior to acutely altered mental status, metabolic encephalopathy secondary to sepsis had been improving. As infections have been treated, patient's mental status once again returning to baseline as patient becomes less drowsy.  - Restart home benztropine  at decreased dose of 0.5 mg twice daily  - Was previously held in setting of  patient's drowsy mental status and desire to avoid further confounding sedation with central acting medications, however patient's family has emphasized importance of remaining on benztropine  and adverse effects of psychosis if taken off - Continue Thorazine  PRN for agitation (PO and IV option if unable to take PO)   - DO NOT administer Zyprexa  or Haldol  for agitation, patient's psych meds have been carefully trialed outpatient and has very adverse effects to antipsychotics that are not Thorazine  - Continue oxcarbazepine  300 mg twice daily for mood stabilization - Continue Ativan  1mg  nightly - in place of home temazepam , decreased dose iso somnolence   # Urinary Retention (resolving) # Penile and scrotal swelling Possibly 2/2 constipation. Foley placed 12/24 with 1L of urine returned and mild bladder stretch injury. Plan for TOV in around 7-10 days after placement. Foley output clear until 12/30 morning when pink-tinged, so pursued TOV then. Patient able to void after but endorsed some burning pain, will order UA but likely from irritation from Foley.  Noted to have small amounts of blood though able to void okay, suspecting trauma from Foley.  On 12/31, noted penile and scrotal swelling that nursing states makes it impossible to cath.  Per family, the swelling actually has improved over admission and is due to third spacing, that has improved as patient's volume status has improved. - Foley removed 12/30 - UA 12/30: neg for infx, with moderate blood and elevated RBC - Repeat UA and urine culture as above   # Sleep  Apnea Given collapsible airways seen on bronchoscopy, patient likely had obstructive sleep apnea. Treating with BiPAP overnight, which patient has tolerated well.  Patient did stop BiPAP when he had vomiting on 12/28 due to concerns for aspiration.  Talked with patient's family on 12/31 into extended conversations.  On the first, patient's family expressed that he has only received CPAP  during admission, not BiPAP.  Expressed that there is been a lot of confusion around the subject.  During second conversation later in the evening, patient's family stated that she actually would like to try pursuing BiPAP as patient appears to have greater respiratory effort with CPAP and is more agitated/does not tolerate CPAP as well.  Patient's family wonders if BiPAP would be more beneficial, and would like primary team and case management to work on obtaining BiPAP for home use.  Per chart review, it appears that BiPAP is what was given in the MICU though there are discrepancies in the chart. 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 BiPAP has been considered and ruled out as it lacks the ability to adequately manage this patient's respiratory disease as it does not offer a volume targeted therapy. I am ordering NIV therapy because of its volume targeting AVAPS AE mode and faster responding AVAPS rate. The severity of this patient's disease is such that failure to provide noninvasive ventilator therapy on a daily basis will likely lead to future hospital re-admissions and life threatening situations. Neuromuscular: Ordering NIV with AVAPS-AE as opposed to BiPAP S/T or BiPAP-AVAPS as patient has respiratory failure secondary to neuromuscular weakness and will require advanced pressure ranges and real time monitoring to ensure hypoventilatory events do not occur. This patient will benefit from portability and the availability of ventilatory support with a backup battery to utilize with mobility during the day and to use for mouthpiece ventilation as needed. These features are not available on a BiPAP or RAD device.   #  Hypotension (resolving) Patient has had ongoing hypotension while sleeping, causing intermittent restarting of pressors during MICU stay. Patient has been off pressors since 12/25 AM. AM cortisol level WNL. Softer blood pressures occur only while patient is sleeping and return to normal once patient awake, suspect this is patient's underlying physiology. Patient with RV dysfunction so will be judicious with fluid resuscitation.  - MAP goal > 65 - Encourage PO intake per SLP recs, poor nutritional status may be contributing to hypotension - PT/OT consulted  # Constipation  X-ray abdomen 12/17 with large colonic stool burden, then decreased to mild burden on 12/23 XR. A of 12/28 XR, moderate stool burden. Last BM: x3 on 12/29.  - Continue Senna 2 tabs nightly  - Continue TID Miralax   # Macrocytic anemia  # Thrombocytopenia On arrival 12/15, Hb 10.4 and platelet 79 then the next day Hb 8.3 and plt 57; levels stable since then. Uncertain etiology but initially thought to be 2/2 sepsis vs nutrition vs medication-related. Hematology consulted, noted that T21 patients are at increased risk of myeloid disease but that patient did not have any evidence of clonal hematopoesis on recent myeloid panel. No abnormal immature/dysplastic cells in the periphery, and suspect his more acute cytopenias and in setting of critical illness. Note carbamazepine, which was  started more recently, could also be play a role. Advise continued closer monitoring and supportive care; if counts do not recover despite clinical improvement, then could consider further bone marrow investigation.  - Transfuse Hb < 7    # Aspergillus antigen positive from RUL (resolved) Cultures from bronchoscopy of RUL on 12/18 resulted with Aspergillus antigen positive. Patient had a bloody bronch that may have resulted in false positive. Discussed with ID pharmacist, who noted that IDSA guidelines utilize a threshold of 1 for positivity while others  use 0.5 as the threshold (our lab). Patient does intermittently have fevers and upper respiratory congestion symptoms, though no concerning findings on CXR or prior CTA chest imaging. 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 Infectious disease consulted, believe this unlikely to be aspergillus given no risk factors (not on steroids or immunocompromised). Repeat CT chest, with no evidence of pulmonary infectious process. Fungitell assay negative.    # Exanthematous Medication Reaction (resolved) New as of 12/20. Unclear etiology. Received EpiPen x 1 and started on Solumedrol. No eosinophilia. Rash improving 12/22. Dermatology consulted and on their assessment the rash appears to be an exanthematous medication eruption due to clinical morphology of a morbiliform truncal rash after starting ceftriaxone. Should this patient require cephalosporins in the future, it is reasonable to consider treating through, as the rash typically self resolves after a few weeks. Given that morphology of rash is improving while on zosyn, this can be considered as a future agent for eruptions that require cephalosporin coverage.    Chronic Problems   The patient's presentation is complicated by the following clinically significant conditions requiring additional evaluation and treatment: - Thrombocytopenia requiring further investigation or monitor - Altered mental status secondary to - Metabolic Encephalopathy POA requiring further investigation or monitoring - Anemia requiring at least daily CBC for further monitoring  Patient does not meet AND/ASPEN criteria for malnutrition at this time (04/16/24 1531)   Daily Checklist: Diet: Regular Diet DVT PPx: Lovenox  40mg  q24h Electrolytes: Replete Potassium to >/= 3.6 and Magnesium  to >/= 1.8 Code Status: Full Code Dispo: Continue MPCU Care  Team Contact Information:  Primary Team: Infectious Disease ([facility]) Primary Resident: Dreama GORMAN Julian,  MD Resident's Pager: [phone] (Infect Disease Intern - Tower)  Interval HistoryBETHA BRAKE. No agitation (above behavioral baseline), no PRNs needed.   All other systems were reviewed and are negative except as noted in the HPI  Objective:  Temp:  [36.8 C (98.2 F)-37.3 C (99.2 F)] 36.8 C (98.2 F) Pulse:  [93-109] 96 SpO2 Pulse:  [98-99] 99 Resp:  [13-24] 20 BP: (99-134)/(69-80) 134/76 FiO2 (%):  [32 %] 32 % SpO2:  [98 %-100 %] 100 %  General: NAD. Awakens to voice, conversant, follows commands. HEENT: Moist mucous membranes Heart: Regular rate and rhythm Lungs: CTA bilaterally anteriorly. Abdomen: mild-distension, non-tender to palpation. Extremities: 1+ lower extremity edema. Puffiness of bilateral hands.  Hands in bilateral mitts today and wrist restraints  GU: Not examined today, previously: Purewick in place obscuring view of penis, swollen scrotum noted. Skin: Not examined today. Previously: diffuse mild pink macules and papules across chest, abdomen, improving from prior days. Neuro: Awake, alert.    Dreama Julian, MD, PGY-1

## 2024-04-26 NOTE — Care Plan (Signed)
 Pt alert. Oriented to self only. Difficult to understand. Family at bedside. Max assist. On 2L [place]. VSS. Non-violent restraints ongoing, no signs of injury during the shift. PT slept through the night once on the floor. Q2 turns maintained throughout the shift. Bed alarm and falls precautions maintained.   Shift Summary Benztropine  was administered during the shift.  Oxygen  therapy was adjusted and maintained, with SpO2 remaining at 98%.  Fall prevention strategies were consistently implemented, including bed alarm and frequent toileting.  Pressure reduction and frequent repositioning were performed to support skin integrity.  Overall, comfort was maintained and no falls or injuries occurred during the shift.   Optimal Comfort and Wellbeing: Pain remained at 0 throughout the shift and mood was consistently calm; benztropine  was administered during the shift.   Absence of Fall and Fall-Related Injury: Fall prevention interventions were maintained, including bed alarm, frequent toileting, and hourly visual checks, with no falls or injuries documented.   Skin Health and Integrity: Skin assessments noted bruising and scrapes, and frequent repositioning, pressure reduction techniques, and a pressure-redistributing mattress were utilized to support skin integrity.   Improved Ability to Complete Activities of Daily Living: Mobility was slightly limited and activity was bedfast; assistance was required for expressing feelings and thoughts, with no change in ability to make self understood.   Optimal Gas Exchange: Oxygen  therapy was provided via nasal cannula and humidified oxygen , with O2 flow rate adjusted as needed; respiratory rate fluctuated but SpO2 remained at 98%, and breath sounds were diminished throughout the shift.

## 2024-04-27 NOTE — Nursing Note (Signed)
 Physical Therapy Treatment Note (04/27/24 1416)   Patient Name:  Derrick Marshall      Medical Record Number: [id number]  Date of Birth: 12/13/1980 Sex: Male    Post-Discharge Physical Therapy Recommendations: PT Post Acute Discharge Recommendations: 3x weekly, Community Ambulator, Supervision for all mobility due to fall risk  Discharge transport recommendations : Ambulatory  Barriers to Discharge: Endurance deficits, Functional strength deficits, Impaired Balance, Gait instability, Inability to safely perform ADLS, Severity of deficits Equipment Recommendation PT DME Recommendations: None       Treatment Diagnosis: Generalized muscle weakness, Difficulty in walking, Abnormalities of gait and mobility, Unsteadiness on feet, Lower extremity weakness    Assessment/Session Summary Problem List: Fall risk, At risk for deconditioning, Visual perception deficits, Impaired judgement, Decreased safety awareness, Shortness of breath, Decreased endurance, Decreased mobility, Decreased strength, Impaired ADLs, Impaired balance, Developmental Deficit  Assessment : Pt tolerates session well as he was able to progress to ambulating around the whole unit today with B HHA. As patient fatigues, he requires min A to ambulate but otherwise stable with wide BOS, no LOB noted. Pt will continue to benefit from skilled therapy services while in hospital to address all functional needs. Will continue to follow per POC  Activity Tolerance: Activity Tolerance: Tolerated treatment well  Bed Mobility     Supine to Sit assistance level: Minimal assist, patient does 75% or more Sit to Supine assistance level: Standby assist, set-up cues, supervision of patient - no hands on Bed Mobility comments: boosting with totalA +2  Transfers Sit to Stand assistance level: Minimal assist, patient does 75% or more   Transfer comments: with BHHA, increased cues for sequencing  Ambulation Level of Assistance: Contact  guard assist, steadying assist, Minimal assist, patient does 75% or more    Distance Ambulated (ft): 300 ft  Ambulation comments: with B HHA, CGA but requiring min A as patient fatigues     Stairs: NT  Additional interventions: Skilled PT interventions provided included assistance with bed mobility to facilitate functional movement; sit-to-stand transfers with appropriate assist; ambulation for functional mobility and activity tolerance; seated EOB and standing balance activities to improve postural stability; and patient/family education on the role of PT, importance of ongoing mobility during hospitalization, fall-prevention strategies including call dont fall, and the current plan of care. Patient and family questions were addressed throughout the session.     Plan Planned Frequency of Treatment: Plan of Care Initiated: 04/16/24 1-2x per day Weekly Frequency: 3-4 days per week Planned Treatment Duration: 04/30/24  Planned Interventions: Education (Patient/Family/Caregiver), Gait training, Therapeutic Exercise, Self-care / Home Management training, Therapeutic Activity, Home exercise program    Subjective   Equipment / Environment: Vascular access (PIV, TLC, Port-a-cath, PICC), Foley, Telemetry Communication Preference: Verbal, Visual   Patient reports: pt and pt's sister, Derrick Marshall, agreeable to PT, I am going home!!! Pain Comments: did not verbalize any pain   Objective Goals    SHORT GOAL #1: Patient will complete bed mobility with supervision.              Date Established : 04/23/24             Time Frame : 2 weeks                        Plan : In Progress   SHORT GOAL #2: Patient will complete functional transfers with supervision and LRAD.             Date Established :  04/23/24              Time Frame : 2 weeks                         Plan : In Progress  SHORT GOAL #3: Patient will ambulate >149ft with supervision and LRAD.             Date Established :  04/23/24             Time Frame : 2 weeks                         Plan : In Progress  SHORT GOAL #4: Patient will negotiate 6 steps with hand railing and min assistance.              Date Established : 04/16/24             Time Frame : 2 weeks              Plan : In Progress    Long Term Goal #1: Pt will improve AMPAC to >21/24 Time Frame: 4 weeks  Cognition Cognition: Impaired at baseline, Follows 1-step commands Cognition comment: pt following simple commands, responds better to commands from sister than from therapist, needs some encouragement to participate  Precautions / Restrictions Precautions: Falls precautions, Delirium Precautions, Aspiration precautions Weight Bearing Status:  (non applicable) Required Braces or Orthoses: Non-applicable              Vitals/Orthostatics : end of session BP 121/84 (93), SpO2 93%  Patient at end of session: All needs in reach, Alarm activated, Friends/Family present, In bed, Lines intact, Notified Nurse  Prior function and Living Environment: Prior Functional Status: Per Prior PT eval: Pt unable to provide PLOF, sister/guardian provided: Pt lives in assisted family living (AFL) where he has access to 24/7 supervision and care. He ambulates without device, occasionally requires hand held assist for stairs due to depth perception deficits. Pt requires assistance for all ADLs. He has had many falls recently due to dizziness. Sister lives nearby and visits frequently. He often is out of the house for activities with his day caregiver.  Living Situation Living Environment: House Lives With: Other Home Living: One level home, Stairs to enter with rails, Tub/shower unit, Walk-in shower, Grab bars in shower, Hand-held shower hose Rail placement (outside): Bilateral rails out of reach Number of Stairs to Enter (outside): 6 Caregiver Identified?: Yes (has family living assistance at all times, including sister Derrick Marshall and consistent hired  caregivers) Engineer, Structural Availability: 24 hours Equipment available at home: Hand-held shower hose   Tests  AM-PAC-6 click Help currently need turning over In bed?: A Little - Minimal/Contact Guard Assist/Supervision Help currently needed sitting down/standing up from chair with arms? : A Little - Minimal/Contact Guard Assist/Supervision Help currently needed moving from supine to sitting on edge of bed?: A Little - Minimal/Contact Guard Assist/Supervision Help currently needed moving to and from bed from wheelchair?: A Little - Minimal/Contact Guard Assist/Supervision Help currently needed walking in a hospital room?: A Little - Minimal/Contact Guard Assist/Supervision Help currently needed climbing 3-5 steps with railing?: A Little - Minimal/Contact Guard Assist/Supervision  Basic Mobility Score 6 click: 18  6 click Score (in points): % of Functional Impairment, Limitation, Restriction 6: 100% impaired, limited, restricted 7-8: At least 80%, but less than 100% impaired, limited restricted 9-13: At least 60%, but less than 80% impaired,  limited restricted 14-19: At least 40%, but less than 60% impaired, limited restricted 20-22: At least 20%, but less than 40% impaired, limited restricted 23: At least 1%, but less than 20% impaired, limited restricted 24: 0% impaired, limited restricted  'AM-PAC' forms are Copyright protected by The [employer] of [facility]    Physical Therapy Session Duration PT Co-Treatment [mins]: 25 (Lindsey Voorhees, OT) Reason for Co-treatment: To safely progress mobility  I attest that I have reviewed the above information. Signed: Elenor Ishihara, PTA 04/27/2024

## 2024-04-27 NOTE — Care Plan (Signed)
 Shift Summary Pt oriented to self. Bipap was refused at night. No pain was reported. Vitals stable on room air. Pt remained in bilateral mitts and soft wrist restraints throughout the night with safety checks performed every 2 hours.  Bed alarm and fall prevention interventions were maintained throughout the shift, with no falls or injuries reported.  Soft restraints were continued and monitored, and  Pain remained at 0, and comfort was preserved without need for intervention.  Frequent repositioning and pressure reduction techniques were used to maintain skin integrity, with no new injuries noted.  General weakness and dependence for ADLs persisted, with regular range of motion exercises performed.  Overall, safety and comfort were maintained, and no significant changes in condition occurred during the shift.   Absence of Hospital-Acquired Illness or Injury: No new injuries or hospital-acquired conditions documented during the shift; bruising noted as scattered but unchanged, and soft restraints were monitored and continued without incident. Lovenox  was administered subcutaneously throughout the shift as an anti-embolism intervention.   Optimal Comfort and Wellbeing: Pain remained at 0 throughout the shift, with no hurt reported and non-verbal pain assessments indicating relaxation and comfort. No pain interventions were required.   Absence of Fall and Fall-Related Injury: Bed alarm was consistently activated, hourly visual checks were performed, and toileting every two hours was maintained; no falls or fall-related injuries occurred. Safety interventions such as environmental modification and family presence were maintained.   Skin Health and Integrity: Frequent repositioning, pressure reduction techniques, and devices were utilized, and skin integrity was preserved with no new injuries noted; incontinence pads were used and Braden score was 15. Bruising was present but unchanged.   Improved  Ability to Complete Activities of Daily Living: General weakness and very limited mobility were noted, with dependence for oral care and assistance needed for communication and expression of thoughts and feelings. Range of motion exercises were performed regularly.

## 2024-04-27 NOTE — Nursing Note (Signed)
 "  OCCUPATIONAL THERAPY Treatment Note (04/27/24 1417)  Patient Name: Derrick Marshall  Medical Record Number: [id number]  Date of Birth: [birth year] Sex: Male   Post-Discharge Occupational Therapy Recommendations: 3x weekly, Supervision needed for safety with basic self-care routines, Supervision needed for safety with functional transfers, Would benefit from assistance with IADLs/ADLs  IADL/ADLS Needing Assistance: Bathing, Toileting, Managing Finances, Medication Management, Meal Preparation, Housekeeping, Tub/shower transfers, Feeding, LB Dressing    Equipment Recommendation OT DME Recommendations: Tub Transfer Bench, Raised toilet seat  OT Treatment Diagnosis: Generalized muscle weakness, Need for assistance with personal care, Limitation of activities due to disability     ASSESSMENT Problem List: Decreased activity tolerance, Decreased cognition, Decreased endurance, Decreased safety awareness, Decreased strength, Fall risk, Gait deviation, Impaired ADLs, Impaired balance, Impaired fine motor skills, Impaired judgement, Impaired vision, Decreased coordination, Decreased mobility, Decreased range of motion, Decreased skin integrity, Increased edema, Pain    Assessment: Pt continues to progress towards goals and his functional baseline, mostly limited by decreased endurance from hospital stay. He will continue to benefit from skilled OT services while inpatient. Recommend 3x/week post-acute services.   SUBJECTIVE Communication Preference: Verbal  Patient / Caregiver reports: I'm going home!  Equipment / Environment: Vascular access (PIV, TLC, Port-a-cath, PICC), Purewick/Condom catheter   OBJECTIVE    Skilled Interventions Performed: After sister assisted with donning shoes, Pt performed supine>sit with Min A. He then stood and ambulated ~350' with Min A via HHA x 2. Near end of functional mobility, Pt evidenced signs of fatigue but without acute distress or any LOB. Pt's  sister receptive to education on energy conservation approaches as Pt returns home, including watching for visual signs of fatigue during mobility and adding ventilation for warm showers. Brief provided in preparation for anticipated d/c later today.     Activity Tolerance During Today's Session Tolerated treatment well   Vitals/Orthostatics: Bp: 121/84 after mobilizing  Cognition Cognition: Impaired at baseline, Follows 1-step commands Safety Judgment: Impulsivity, Decreased awareness of need for safety  Functional Mobility Transfers: Contact Guard assist, +2 assist (HHA) Bed Mobility : Min assist  Ambulation Level of Assistance: Minimal assist, patient does 75% or more Assistive Device:  (HHA x2)   Goals: Patient and Family Goals: to go home  SHORT GOAL #1: Pt will complete toilet t/f + routines with modA and LRAD  Date Established : 04/16/24  Time Frame : 2 weeks  Plan : In Progress   SHORT GOAL #2: Pt will participate in grooming tasks with set up assistance + LRAD  Date Established : 04/16/24  Time Frame : 2 weeks   Plan : In Progress  SHORT GOAL #3: Pt will participate in full body dressing with set up assistance + LRAD  Date Established : 04/16/24  Time Frame : 2 weeks   Plan : In Progress  SHORT GOAL #4: Pt will follow 100% of one step commands during ADLs with mod I and compensatory strategies prn  Date Established : 04/16/24  Time Frame : 2 weeks  Plan : In Progress  Long Term Goal #1: Pt will score 20+/24 on AMPAC. Time Frame: 4 weeks  Precautions: Falls precautions, Delirium Precautions, Aspiration precautions  Weight Bearing Status:  (non applicable)     Required Braces or Orthoses: Non-applicable  Family/Caregiver present: Sister  Equipment / Environment: Vascular access (PIV, TLC, Port-a-cath, PICC), Purewick/Condom catheter  PLAN  Planned Frequency of Treatment: Plan of Care Initiated: 04/16/24 1-2x per day Weekly Frequency: 3-4 days per  week Planned Treatment  Duration: 05/08/24   Planned Interventions:  Education (Patient/Family/Caregiver), Home Exercise Program, Neuromuscular Re-education, Self-Care/Home Training, Therapeutic Exercise, Therapeutic Activity, Cognitive Skills Development    Prognosis Prognosis: Good Positive Indicators: Caregiver support,  PLOF Barriers to Discharge: Endurance deficits, Impaired Balance, Gait instability, Inability to safely perform ADLS, Cognitive deficits   Prior function and Living Environment Prior Functional Status: per last OT note: Sister has guardianship of pt. Per sister, pt resides in Assisted Family Living (AFL), attends adult day services, and has 24/7 assistance from herself and/or other paid caregivers. Pt is typically mod(I) in dressing and feeding, and requires assistance with bathing and toileting with hygiene. Pt reportedly ambulates on level ground independently with no AD, but requires assistance with navigating curbs and stairs 2/2 poor depth perception. Pt enjoys listening to the [employer] and product manager.  Living Situation Living environment: House Lives With: Other, Care Staff Home Living: One level home, Stairs to enter with rails, Tub/shower unit, Standard height toilet, Hand-held shower hose Rail placement (outside): Bilateral rails out of reach Number of Stairs to Enter (outside): 6  Equipment available at home: Hand-held shower hose   AM-PAC Daily Activity Assessment Lower Body Dressing assistance needs: A lot - Maximum/Moderate Assistance Bathing assistance needs: A lot - Maximum/Moderate Assistance Toileting assistance needs: A lot - Maximum/Moderate Assistance Upper Body Dressing assistance needs: A Little - Minimal/Contact Guard Assist/Supervision Personal Grooming assistance needs: A Little - Minimal/Contact Guard Assist/Supervision Eating Meals assistance needs: A lot - Maximum/Moderate Assistance   Daily Activity Score: 14  Score (in  points): % of Functional Impairment, Limitation, Restriction 5: 100% impaired, limited, restricted 6-7: At least 80%, but less than 100% impaired, limited restricted 8-11: At least 60%, but less than 80% impaired, limited restricted 12-16: At least 40%, but less than 60% impaired, limited restricted 17-18: At least 20%, but less than 40% impaired, limited restricted 19: At least 1%, but less than 20% impaired, limited restricted 20: 0% impaired, limited restricted  'AM-PAC' forms are Copyright protected by The [facility]   Patient at end of session: All needs in reach, Friends/Family present  Occupational Therapy Session Duration OT Co-Treatment [mins]: 25 Direct Care Non-Bill [mins]: 13 Reason for Co-treatment: To safely progress mobility; with Elenor Legacy, PTA               I attest that I have reviewed the above information. Signed: Manuelita DELENA Bolk, OT Filed 04/27/2024  "

## 2024-04-28 NOTE — Discharge Summary (Signed)
 ------------------------------------------------------------------------------- Attestation signed by [name], [name], MD at 04/29/24 [phone] Teaching Physician Note   I saw and evaluated Derrick Marshall, participating in the key portions of the service on the day of discharge.  I reviewed the resident's note and agree with the discharge plans and disposition.  I personally spent over 30 minutes in discharge planning services.  Issues Impacting Complexity of Management: <redacted file path> The patient's disposition has been complicated by the following clinically significant conditions requiring additional evaluation and treatment or having a significant effect of this patient's care: - General physical deterioration POA requiring additional resources: DME, PT, or OT  The patient's hospital stay has been complicated by the following clinically significant conditions requiring additional evaluation and treatment or having a significant effect of this patient's care: - Thrombocytopenia POA requiring further investigation or monitor  Body mass index is 32.42 kg/m.   Malnutrition Evaluation as performed by RD, LDN: Patient does not meet AND/ASPEN criteria for malnutrition at this time (04/16/24 1531)               Code Status: Full Code  Care for a suspected or confirmed infection was provided by an ID specialist in this encounter. [phone])  Dorn JINNY Hearing, MD  [facility] Division of Infectious Diseases        -------------------------------------------------------------------------------  Physician Discharge Summary  Admit date: 04/09/2024  Discharge date and time: 04/27/2024  Discharge to: ALF  Discharge Service: Infectious Disease ([facility])  Discharge Attending Physician: Dorn Hearing, MD   Discharge Diagnoses: Chronic aspiration pneumonia, mildly reduced RV dysfunction, sleep apnea  Procedures: Central line, arterial line, smallbore feeding tube, flexible  bronchoscopy with BAL, complicated Foley catheter placement  Pertinent Test Results: labs: On day of discharge Hb 7.4, platelets 64, alk phos 122; VBG pH 7.41 with pCO2 49, UA with moderate blood on 12/30, microbiology: Negative blood cultures, urine culture, GI pathogen panel, RPP, radiology: CXR: On 12/28 unchanged small bilateral pleural effusions, persistent bilateral airspace and interstitial opacities, KUB: On 12/28 interval resolution of previously identified marked gaseous distention of stomach and moderate colonic stool burden, and CT scan: Chest without contrast 12/24 with alveolar pulmonary edema small bilateral pleural effusions suggestive of volume overload, likely cardiogenic, and bronchoscopy: Bronchial culture 12/18 with Candida  Outpatient To-Do:  Primary care provider:  [  ] If platelets do not normalize after infection treated, consider referring to hematology. [  ] Start iron supplements after infection treated [  ] PFTs outpatient [  ] Consider sleep study outpatient [  ] Outpatient urology referral for trial of void [  ] Outpatient abdominal ultrasound to assess for fatty liver  Adult congenital heart clinic: [  ] Evaluate new RV dilation   Derrick Marshall is a 44 y.o. male with Down syndrome, tetralogy of Fallot s/p repair (1984), new RV dysfunction, severe intellectual disability, hx of aggression, ASD, ADHD who p/w dyspnea, cough, falls, lethargy, found to have sepsis 2/2 CAP. During hospitalization  became hypoxic, febrile, tachycardic, tachypneic, and hypotensive, requiring initiation of pressors and transfer to the MICU. Had increased work of breathing and required intubation for airway protection. TTE noted mild RV dysfunction, so he underwent TEE which noted mild pulmonary insufficiency and mild RV dysfunction, unlikely to be contributing to his respiratory failure per cardiology. He was treated for pneumonia with 7 days of antibiotics. He was weaned off sedation and  pressors, and extubated to BiPAP on 12/21. His course has been complicated by intermittent hypotension and fevers, prompting additional  infectious workups.  Transferred to ID service on 12/29.  His intermittent low-grade fevers are likely secondary to chronic aspiration, treated with a 10-day course of Unasyn  transition to Augmentin  on discharge.   Hospital course by problem:  Chronic aspiration pneumonia  Acute hypoxic respiratory failure 2/2 pneumonia  Septic shock 2/2 pneumonia Patient presented with increased work of breathing, tachypnea, new increased oxygen  need. Had worsening hypoxia with increasing oxygen  requirements, requiring intubation for airway protection. Etiology most likely secondary to community-acquired pneumonia vs aspiration pneumonia. Bronch 12/18 with tracheal malacia and dynamic airway collapse throughout, bronchial cultures positive for candida, likely secondary to chronic colonization. Treated with 7-day course of antibiotics for pneumonia: vanc/mero -> CTX/azithro -> levofloxacin  (switched to Levo iso potential drug rash from ceftriaxone). Weaned off sedation and pressors, extubated to BiPAP on 12/21.  Stayed on low amounts of nasal cannula oxygen  thereafter during admission.  Intermittently has low grade fevers, multiple infectious workup with repeat blood cultures remain NGTD. Aspergillus antigen from 12/18 bronch turned positive on 12/23, infectious disease consulted for assistance to interpret this result - felt not to be a true positive result as no host risk factors, no clinical signs of aspergillus infection, and antigen is mildly positive (and may represent cross-reactive from a bloody bronch sample). Transferred to ID service on 12/29. Intermittent fevers felt to be secondary to acute on chronic aspiration, started 10-day course of Unasyn  while admitted, discharged with Augmentin  to finish out the 10-day course.  Acute toxic metabolic encephalopathy, due to sepsis  (resolved)  History of Aggression  Down syndrome  ADHD  ASD Patient at baseline fluctuates between aggressive and affectionate moods due to history of Down syndrome impacting baseline mental status.  In the hospital, fluctuating altered mental status after rapid event on 12/17.  Confusion at that time could have been secondary to centrally acting home medications including Thorazine , Restoril  (temazepam ), benztropine .  Prior to acutely altered mental status, metabolic encephalopathy secondary to sepsis had been improving. Giving ativan  2mg  in place of home temazepam  to avoid benzo withdrawal.  Initially held benztropine  1 mg twice daily in setting of sedation, however resumed once patient nondrowsy and after patient sister clarified that not being on benztropine  can cause psychosis inpatient.  Per sister, follows with outpatient psychiatry he was carefully trialed and titrated many psychiatric/centrally acting medications in the patient.  Uses Thorazine  at home for agitation as needed; this medication was also held briefly while there was concern for sedation but resumed before discharge. Decreased Ativan  to 1mg  and discontinued cetrizine iso daytime somnolence.  Sleep Apnea Patient does not wear CPAP or BiPAP at home, no supplemental oxygen  at home.  While in the hospital, did have nighttime desaturations suspected to be obstructive sleep apnea in the setting of collapsible airways seen on bronchoscopy. Treated with BiPAP overnight started in the MICU, which patient has tolerated well however stopped after having vomiting on 12/28 due to concerns for aspiration.  Extensive conversation on the subject of CPAP versus BiPAP with patient's family, provider team, RT and case management.  Patient's family has expressed that he only received CPAP during admission on 1/1, expressed to family that there is a lot of confusion around the subject; later that evening, patient's family stated she actually would like to  try pursuing a BiPAP for the patient as he appeared to have greater respiratory effort with CPAP and is more agitated with that machine.  Discussion with RT on which mode of ventilation including NIV would be most beneficial  for the patient and how to require it.  Ultimately before having a conclusive answer, patient's family reported for discharge that the patient actually was breathing better through the night, does not have any CPAP or BiPAP machine at home, and would like to discharge the patient on 04/27/2024.  Patient's family verbalized wanting to discharge despite not having a conclusive answer to nighttime ventilation, including what other requirements such as a sleep study may need to be met before being able to obtain CPAP, BiPAP, or AVAPS.  Patient's family did express that she does not believe patient would be able to tolerate a sleep study.  Consider following up with this issue outpatient.  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 Cardiology consulted, these findings may be secondary to congenital cardiac disease versus possible pulmonary regurgitation. TEE obtained with normal LVEF, mild pulmonary insufficiency, and mild RV dysfunction. Per cardiology, the pulmonic regurgitation and mild RV dysfunction are unlikely to be contributing to respiratory failure. Recommend maintaining euvolemia and caution with aggressive diuresis given mildly pre-load dependent RV and normal LV function. Patient had ongoing hypotension while sleeping, causing intermittent restarting of pressors during MICU stay. Patient has been off pressors since 12/25 AM. AM cortisol level WNL. Softer blood pressures occur only while patient is sleeping and return to normal once patient awake, suspect this is patient's underlying  physiology.   Constipation X-ray abdomen 12/17 with marked colonic stool burden.  Still moderate stool burden on 12/28 abdominal x-ray. Continue bowel regimen to ensure consistent BMs.   Urinary retention  Mild bladder stretch injury  Penile and scrotal swelling  Urinary retention thought to be possibly due to constipation verus anticholinergic effects from multiple psychiatric medications. Foley removed 12/23, no spontaneous voiding and bladder scan showing retention, replaced on 12/24. Urology consulted for assistance with difficult foley placement, with 1L urine return and mild bladder stretch injury.  Foley output clear until 12/30 morning when pink-tinged, so pursued TOV then. Patient able to void after but endorsed some burning pain, will order UA but likely from irritation from Foley. Noted to have small amounts of blood though able to void okay, suspecting trauma from Foley. UA negative for infection. On 12/31, noted penile and scrotal swelling that nursing states makes it impossible to cath. Per family, the swelling actually has improved over admission and is due to third spacing, that has improved as patient's volume status has improved.   Macrocytic anemia  Thrombocytopenia On arrival 12/15, Hb 10.4 and platelet 79 then the next day Hb 8.3 and plt 57; levels stable since then. Uncertain etiology but initially thought to be secondary to sepsis versus nutrition versus medication-related. Benign hematology consulted, noted patients with trisomy 21 are at increased risk for myeloid disease, patient did not have evidence of clonal hematopoesis on recent myeloid panel. No abnormal immature/dysplastic cells in the periphery. Acute cytopenias likely in the setting of critical illness. Carbamazepine, which was started more recently, could also be playing a role. Continue B12 supplementation. Considered IV iron repletion since septic shock resolved, deferred due to elevated ferritin levels. If counts do  not recover despite clinical improvement, then consider further bone marrow investigation.   Exanthematous medication reaction (resolved)  Diffuse maculopapular rash on chest, abdomen, arms, and legs noted overnight 12/19 (pictures in chart from 12/19). Unclear etiology, initially thought  to be an anaphylactic reaction and treated with EpiPen x1 and steroids without improvement. Rash appears to be allergic and may represent a drug rash from ceftriaxone. Antibiotics changed to Levofloxacin  (see above). Started on Cetrizine BID to treat allergic component, discontinued due to concerns for somnolence. Dermatology consulted, recommend PO Cetirizine and Triamcinolone cream for itching. Per Derm, if patient requires cephalosporins in future it is reasonable to consider treating through as this is a self resolving rash after a few weeks.     Condition at Discharge: stable Discharge Medications:    Your Medication List     START taking these medications    amoxicillin -clavulanate 875-125 mg per tablet Commonly known as: AUGMENTIN  Take 1 tablet by mouth two (2) times a day for 3 days.   zinc sulfate 220 mg (50 mg elemental zinc) capsule Commonly known as: ZINCATE Take 1 capsule (220 mg total) by mouth daily.       CHANGE how you take these medications    benztropine  1 MG tablet Commonly known as: COGENTIN  Take 0.5 tablets (0.5 mg total) by mouth two (2) times a day. (WHITE OVAL TABLET WITH EP 137) What changed: how much to take       CONTINUE taking these medications    atorvastatin  40 MG tablet Commonly known as: LIPITOR Take 1 tablet (40 mg total) by mouth daily.   chlorproMAZINE  100 MG tablet Commonly known as: THORAZINE  Take 0.5 tablets (50 mg total) by mouth two (2) times a day as needed.   CLEARLAX 17 gram/dose powder Generic drug: polyethylene glycol MIX 17 GRAMS WITH WATER, OR JUICE, AND TAKE BY MOUTH DAILY AS NEEDED, CONSTIPATION. (GENERIC MIRALAX )   docusate sodium   100 MG capsule Commonly known as: COLACE Take 1 capsule (100 mg total) by mouth two (2) times a day as needed.   INVEGA SUSTENNA 234 mg/1.5 mL Syrg Generic drug: paliperidone palmitate Inject 1.5 mL (234 mg total) into the muscle every thirty (30) days.   omeprazole 20 MG capsule Commonly known as: PriLOSEC Take 1 capsule (20 mg total) by mouth every other day.   OXcarbazepine  300 MG tablet Commonly known as: TRILEPTAL  Take 1 tablet (300 mg total) by mouth two (2) times a day.   senna 8.6 mg tablet Commonly known as: SENOKOT Take 2 tablets by mouth two (2) times a day. PRN   temAZEpam  30 mg capsule Commonly known as: RESTORIL  Take 1 capsule (30 mg total) by mouth nightly.        Pending Test Results:   Pending Labs     Order Current Status   AFB culture Preliminary result   Blood Culture #1 Preliminary result   Blood Culture #2 Preliminary result   Fungal ([place]ould) Pathogen Culture Preliminary result       Discharge Instructions:   Other Instructions     Discharge instructions     DIAGNOSIS  -- You were admitted to [facility] with acute hypoxic respiratory failure and septic shock secondary to pneumonia.  Etiology likely from aspiration pneumonia, treated with antibiotics in the hospital. You are being discharged with Augmentin  ending 04/30/24.   MEDICATIONS  -- Please refer to your after visit summary (AVS) medication list for updates or changes related to your hospital stay.   SIGNS AND SYMPTOMS TO MONITOR  -- If you begin to experience Fever > 100.4, Chest pain/pressure, Shortness of breath , Fainting, confusion, or seizures , or Severe pain  call 911 or go to the emergency department.    FOLLOW-UP  APPOINTMENTS Your Primary Care Doctor is listed as Alyse Slater Pao, MD. Your follow-up appointment needs to be scheduled by you.   You should follow up with [facility] Infectious Diseases. This appointment is being requested for you; if you do not hear anything within a few  days, please call at: [phone]   Please see the front page of your after visit summary (AVS) for your scheduled [facility] appointments and outstanding referrals.    EMERGENCY CONTACT INFORMATION  -- If you have additional questions about your hospitalization please call [phone]. If needed, you can ask to discuss your symptoms or questions with the [facility]  resident physician.  -- If it has been more than 7 days since your hospitalization, please reach out to your primary care physician for further questions and guidance.     Follow Up instructions and Outpatient Referrals    Discharge instructions      Appointments which have been scheduled for you    Aug 30, 2024 2:00 PM (Arrive by 1:45 PM) PHYSICAL EXAM with Slater Pao Alyse, MD [facility] [address] AT [facility]) [address] [place] [phone]  Arrive 15 minutes prior to appointment.         I spent greater than 30 minutes in the discharge of this patient.

## 2024-04-30 ENCOUNTER — Encounter (HOSPITAL_COMMUNITY): Payer: Self-pay

## 2024-04-30 ENCOUNTER — Emergency Department (HOSPITAL_COMMUNITY)

## 2024-04-30 ENCOUNTER — Inpatient Hospital Stay (HOSPITAL_COMMUNITY)
Admission: EM | Admit: 2024-04-30 | Discharge: 2024-05-02 | DRG: 602 | Disposition: A | Discharge status: Home / Self Care

## 2024-04-30 DIAGNOSIS — D696 Thrombocytopenia, unspecified: Secondary | ICD-10-CM | POA: Diagnosis present

## 2024-04-30 DIAGNOSIS — J9611 Chronic respiratory failure with hypoxia: Secondary | ICD-10-CM | POA: Diagnosis present

## 2024-04-30 DIAGNOSIS — R1310 Dysphagia, unspecified: Secondary | ICD-10-CM | POA: Diagnosis not present

## 2024-04-30 DIAGNOSIS — Z8774 Personal history of (corrected) congenital malformations of heart and circulatory system: Secondary | ICD-10-CM

## 2024-04-30 DIAGNOSIS — G4730 Sleep apnea, unspecified: Secondary | ICD-10-CM | POA: Diagnosis present

## 2024-04-30 DIAGNOSIS — Q213 Tetralogy of Fallot: Secondary | ICD-10-CM | POA: Diagnosis not present

## 2024-04-30 DIAGNOSIS — R1312 Dysphagia, oropharyngeal phase: Secondary | ICD-10-CM | POA: Diagnosis present

## 2024-04-30 DIAGNOSIS — Z888 Allergy status to other drugs, medicaments and biological substances status: Secondary | ICD-10-CM

## 2024-04-30 DIAGNOSIS — R300 Dysuria: Secondary | ICD-10-CM | POA: Diagnosis present

## 2024-04-30 DIAGNOSIS — Z8701 Personal history of pneumonia (recurrent): Secondary | ICD-10-CM

## 2024-04-30 DIAGNOSIS — I10 Essential (primary) hypertension: Secondary | ICD-10-CM | POA: Diagnosis present

## 2024-04-30 DIAGNOSIS — R5381 Other malaise: Secondary | ICD-10-CM

## 2024-04-30 DIAGNOSIS — D709 Neutropenia, unspecified: Secondary | ICD-10-CM | POA: Diagnosis present

## 2024-04-30 DIAGNOSIS — F909 Attention-deficit hyperactivity disorder, unspecified type: Secondary | ICD-10-CM | POA: Diagnosis present

## 2024-04-30 DIAGNOSIS — K5900 Constipation, unspecified: Secondary | ICD-10-CM | POA: Diagnosis present

## 2024-04-30 DIAGNOSIS — E8770 Fluid overload, unspecified: Secondary | ICD-10-CM | POA: Diagnosis present

## 2024-04-30 DIAGNOSIS — F840 Autistic disorder: Secondary | ICD-10-CM | POA: Diagnosis present

## 2024-04-30 DIAGNOSIS — F72 Severe intellectual disabilities: Secondary | ICD-10-CM | POA: Diagnosis present

## 2024-04-30 DIAGNOSIS — L03221 Cellulitis of neck: Principal | ICD-10-CM | POA: Diagnosis present

## 2024-04-30 DIAGNOSIS — R591 Generalized enlarged lymph nodes: Principal | ICD-10-CM | POA: Diagnosis present

## 2024-04-30 DIAGNOSIS — Z79899 Other long term (current) drug therapy: Secondary | ICD-10-CM

## 2024-04-30 DIAGNOSIS — G709 Myoneural disorder, unspecified: Secondary | ICD-10-CM | POA: Diagnosis present

## 2024-04-30 DIAGNOSIS — Q909 Down syndrome, unspecified: Secondary | ICD-10-CM

## 2024-04-30 DIAGNOSIS — D539 Nutritional anemia, unspecified: Secondary | ICD-10-CM | POA: Diagnosis present

## 2024-04-30 DIAGNOSIS — F32A Depression, unspecified: Secondary | ICD-10-CM | POA: Diagnosis present

## 2024-04-30 LAB — COMPREHENSIVE METABOLIC PANEL WITH GFR
ALT: 32 U/L (ref 0–44)
AST: 26 U/L (ref 15–41)
Albumin: 3.1 g/dL — ABNORMAL LOW (ref 3.5–5.0)
Alkaline Phosphatase: 120 U/L (ref 38–126)
Anion gap: 8 (ref 5–15)
BUN: 6 mg/dL (ref 6–20)
CO2: 27 mmol/L (ref 22–32)
Calcium: 8.5 mg/dL — ABNORMAL LOW (ref 8.9–10.3)
Chloride: 103 mmol/L (ref 98–111)
Creatinine, Ser: 0.88 mg/dL (ref 0.61–1.24)
GFR, Estimated: >60 mL/min
Glucose, Bld: 105 mg/dL — ABNORMAL HIGH (ref 70–99)
Potassium: 4.1 mmol/L (ref 3.5–5.1)
Sodium: 138 mmol/L (ref 135–145)
Total Bilirubin: 0.5 mg/dL (ref 0.0–1.2)
Total Protein: 6.5 g/dL (ref 6.5–8.1)

## 2024-04-30 LAB — CBC WITH DIFFERENTIAL/PLATELET
Basophils Absolute: 0.1 K/uL (ref 0.0–0.1)
Basophils Relative: 2 %
Eosinophils Absolute: 0 K/uL (ref 0.0–0.5)
Eosinophils Relative: 1 %
HCT: 23.6 % — ABNORMAL LOW (ref 39.0–52.0)
Hemoglobin: 7.7 g/dL — ABNORMAL LOW (ref 13.0–17.0)
Lymphocytes Relative: 25 %
Lymphs Abs: 0.7 K/uL (ref 0.7–4.0)
MCH: 33.5 pg (ref 26.0–34.0)
MCHC: 32.6 g/dL (ref 30.0–36.0)
MCV: 102.6 fL — ABNORMAL HIGH (ref 80.0–100.0)
Monocytes Absolute: 0.1 K/uL (ref 0.1–1.0)
Monocytes Relative: 4 %
Neutro Abs: 1.9 K/uL (ref 1.7–7.7)
Neutrophils Relative %: 68 %
Platelets: 88 K/uL — ABNORMAL LOW (ref 150–400)
RBC: 2.3 MIL/uL — ABNORMAL LOW (ref 4.22–5.81)
RDW: 19.3 % — ABNORMAL HIGH (ref 11.5–15.5)
WBC: 2.8 K/uL — ABNORMAL LOW (ref 4.0–10.5)
nRBC: 0 % (ref 0.0–0.2)

## 2024-04-30 LAB — I-STAT VENOUS BLOOD GAS, ED
Acid-Base Excess: 2 mmol/L (ref 0.0–2.0)
Bicarbonate: 26.5 mmol/L (ref 20.0–28.0)
Calcium, Ion: 1.15 mmol/L (ref 1.15–1.40)
HCT: 25 % — ABNORMAL LOW (ref 39.0–52.0)
Hemoglobin: 8.5 g/dL — ABNORMAL LOW (ref 13.0–17.0)
O2 Saturation: 50 %
Potassium: 4.1 mmol/L (ref 3.5–5.1)
Sodium: 139 mmol/L (ref 135–145)
TCO2: 28 mmol/L (ref 22–32)
pCO2, Ven: 38.9 mmHg — ABNORMAL LOW (ref 44–60)
pH, Ven: 7.441 — ABNORMAL HIGH (ref 7.25–7.43)
pO2, Ven: 26 mmHg — CL (ref 32–45)

## 2024-04-30 LAB — I-STAT CHEM 8, ED
BUN: 4 mg/dL — ABNORMAL LOW (ref 6–20)
Calcium, Ion: 1.16 mmol/L (ref 1.15–1.40)
Chloride: 103 mmol/L (ref 98–111)
Creatinine, Ser: 0.9 mg/dL (ref 0.61–1.24)
Glucose, Bld: 104 mg/dL — ABNORMAL HIGH (ref 70–99)
HCT: 23 % — ABNORMAL LOW (ref 39.0–52.0)
Hemoglobin: 7.8 g/dL — ABNORMAL LOW (ref 13.0–17.0)
Potassium: 4.1 mmol/L (ref 3.5–5.1)
Sodium: 139 mmol/L (ref 135–145)
TCO2: 25 mmol/L (ref 22–32)

## 2024-04-30 LAB — PRO BRAIN NATRIURETIC PEPTIDE: Pro Brain Natriuretic Peptide: 1899 pg/mL — ABNORMAL HIGH

## 2024-04-30 LAB — TROPONIN T, HIGH SENSITIVITY
Troponin T High Sensitivity: 51 ng/L — ABNORMAL HIGH (ref 0–19)
Troponin T High Sensitivity: 48 ng/L — ABNORMAL HIGH (ref 0–19)

## 2024-04-30 MED ORDER — FUROSEMIDE 10 MG/ML IJ SOLN
40.0000 mg | Freq: Once | INTRAMUSCULAR | Status: AC
  Administered 2024-04-30: 40 mg via INTRAVENOUS
  Filled 2024-04-30: qty 4

## 2024-04-30 MED ORDER — IOHEXOL 350 MG/ML SOLN
75.0000 mL | Freq: Once | INTRAVENOUS | Status: AC | PRN
  Administered 2024-04-30: 75 mL via INTRAVENOUS

## 2024-04-30 MED ORDER — FUROSEMIDE 10 MG/ML IJ SOLN: 20.0000 mg | Freq: Once | INTRAMUSCULAR | Status: DC

## 2024-04-30 MED ORDER — POLYETHYLENE GLYCOL 3350 17 G PO PACK
17.0000 g | PACK | Freq: Every day | ORAL | Status: DC
  Administered 2024-04-30 – 2024-05-02 (×3): 17 g via ORAL
  Filled 2024-04-30 (×3): qty 1

## 2024-04-30 MED ORDER — CHLORPROMAZINE HCL 100 MG PO TABS: 100.0000 mg | ORAL_TABLET | Freq: Four times a day (QID) | ORAL | Status: DC | PRN

## 2024-04-30 MED ORDER — OXCARBAZEPINE 300 MG PO TABS
300.0000 mg | ORAL_TABLET | Freq: Two times a day (BID) | ORAL | Status: DC
  Administered 2024-04-30 – 2024-05-02 (×4): 300 mg via ORAL
  Filled 2024-04-30 (×5): qty 1

## 2024-04-30 MED ORDER — BENZTROPINE MESYLATE 0.5 MG PO TABS
0.5000 mg | ORAL_TABLET | Freq: Two times a day (BID) | ORAL | Status: DC
  Administered 2024-04-30 – 2024-05-02 (×3): 0.5 mg via ORAL
  Filled 2024-04-30 (×5): qty 1

## 2024-04-30 MED ORDER — NYSTATIN 100000 UNIT/ML MT SUSP
5.0000 mL | Freq: Four times a day (QID) | OROMUCOSAL | Status: DC
  Administered 2024-04-30 – 2024-05-02 (×6): 500000 [IU] via OROMUCOSAL
  Filled 2024-04-30 (×6): qty 5

## 2024-04-30 MED ORDER — LINEZOLID 600 MG PO TABS
600.0000 mg | ORAL_TABLET | Freq: Two times a day (BID) | ORAL | Status: DC
  Administered 2024-04-30 – 2024-05-02 (×3): 600 mg via ORAL
  Filled 2024-04-30 (×5): qty 1

## 2024-04-30 MED ORDER — ACETAMINOPHEN 325 MG PO TABS: 650.0000 mg | ORAL_TABLET | Freq: Four times a day (QID) | ORAL | Status: DC | PRN

## 2024-04-30 MED ORDER — ACETAMINOPHEN 650 MG RE SUPP: 650.0000 mg | Freq: Four times a day (QID) | RECTAL | Status: DC | PRN

## 2024-04-30 MED ORDER — ATORVASTATIN CALCIUM 40 MG PO TABS
40.0000 mg | ORAL_TABLET | Freq: Every day | ORAL | Status: DC
  Administered 2024-04-30 – 2024-05-01 (×2): 40 mg via ORAL
  Filled 2024-04-30 (×2): qty 1

## 2024-04-30 MED ORDER — TEMAZEPAM 15 MG PO CAPS
15.0000 mg | ORAL_CAPSULE | Freq: Every day | ORAL | Status: DC
  Administered 2024-04-30 – 2024-05-01 (×2): 15 mg via ORAL
  Filled 2024-04-30 (×2): qty 1

## 2024-04-30 MED ORDER — ENOXAPARIN SODIUM 40 MG/0.4ML IJ SOSY
40.0000 mg | PREFILLED_SYRINGE | INTRAMUSCULAR | Status: DC
  Administered 2024-04-30 – 2024-05-01 (×2): 40 mg via SUBCUTANEOUS
  Filled 2024-04-30 (×2): qty 0.4

## 2024-04-30 MED ORDER — LEVOFLOXACIN 750 MG PO TABS
750.0000 mg | ORAL_TABLET | Freq: Every day | ORAL | Status: DC
  Administered 2024-04-30 – 2024-05-02 (×3): 750 mg via ORAL
  Filled 2024-04-30 (×3): qty 1

## 2024-04-30 MED ORDER — CLINDAMYCIN HCL 150 MG PO CAPS
300.0000 mg | ORAL_CAPSULE | Freq: Once | ORAL | Status: AC
  Administered 2024-04-30: 300 mg via ORAL
  Filled 2024-04-30: qty 2

## 2024-04-30 NOTE — ED Provider Notes (Signed)
 " [place] [facility] Provider Note   CSN: [id number] Arrival date & time: 04/30/24  1012     Patient presents with: No chief complaint on file.   Derrick Marshall is a 44 y.o. male.   HPI 44 year old male with history of Down syndrome, tetralogy of Fallot, and recent prolonged admission to [facility] presents with neck swelling and trouble breathing/swallowing.  History is primarily from sister.  He was admitted from December 15 until 3 days ago.  Recovered and sister feels like he was discharged too early.  At 1 point during the admission he was supposed to be discharged with CPAP or BiPAP but when he changed services that seem to get dropped.  He seems sleepier than normal and not himself.  Normally he is relatively aggressive.  His neck seems to be swollen this morning, it appeared to be both sides but now its primarily just the left.  However it also feels like his left neck is getting a little better.  He was having some trouble swallowing and water was dribbling out of his mouth this morning.  No fevers that she knows of. Patient was intubated during his time at [facility] and sister wonders if this could be a complication.  Prior to Admission medications  Medication Sig Start Date End Date Taking? Authorizing Provider  amoxicillin -clavulanate (AUGMENTIN ) 875-125 MG tablet Take 1 tablet by mouth 2 (two) times daily. 04/28/24 05/12/24 Yes [provider]  atorvastatin  (LIPITOR) 40 MG tablet Take 1 tablet (40 mg total) by mouth daily at 6 PM. 01/31/18  Yes Laurence Bridegroom, MD  benztropine  (COGENTIN ) 1 MG tablet Take 0.5 mg by mouth 2 (two) times daily.   Yes [provider]  chlorproMAZINE  (THORAZINE ) 100 MG tablet Take 100 mg by mouth 4 (four) times daily as needed (severe aggression). 09/13/23  Yes [provider]  INVEGA SUSTENNA 234 MG/1.5ML injection Inject 234 mg into the muscle once. 03/15/24  Yes [provider]  omeprazole (PRILOSEC)  20 MG capsule Take 20 mg by mouth every other day. 04/06/24  Yes [provider]  Oxcarbazepine  (TRILEPTAL ) 300 MG tablet Take 300 mg by mouth 2 (two) times daily. 03/15/24  Yes [provider]  temazepam  (RESTORIL ) 30 MG capsule Take 30 mg by mouth at bedtime. 02/27/24  Yes [provider]  vitamin B-12 (CYANOCOBALAMIN ) 500 MCG tablet Take 1,000 mcg by mouth 2 (two) times daily. Patient not taking: Reported on 04/30/2024    [provider]  Vitamin D , Ergocalciferol , (DRISDOL ) 50000 units CAPS capsule Take 50,000 Units by mouth every 7 (seven) days. Patient not taking: Reported on 04/30/2024    [provider]    Allergies: Ceftriaxone, Celexa [citalopram hydrobromide], Zyprexa  [olanzapine ], and Citalopram    Review of Systems  Updated Vital Signs BP 132/83   Pulse 98   Temp 98.1 F (36.7 C)   Resp 20   SpO2 100%   Physical Exam Vitals and nursing note reviewed.  Constitutional:      Appearance: He is well-developed.     Comments: Down syndrome  HENT:     Head: Atraumatic.  Neck:     Comments: Patient has a large neck in general, perhaps left greater than right swelling to the lateral neck.  No stridor appreciated Cardiovascular:     Rate and Rhythm: Regular rhythm. Tachycardia present.     Heart sounds: Murmur heard.     Comments: HR~100 Pulmonary:     Effort: Pulmonary effort  is normal.     Breath sounds: Normal breath sounds. No stridor. No wheezing.  Abdominal:     Palpations: Abdomen is soft.     Tenderness: There is no abdominal tenderness.  Musculoskeletal:     Cervical back: No rigidity.     Comments: Mild pitting edema above the ankles  Skin:    General: Skin is warm and dry.  Neurological:     Mental Status: He is alert.     (all labs ordered are listed, but only abnormal results are displayed) Labs Reviewed  CBC WITH DIFFERENTIAL/PLATELET - Abnormal; Notable for the following components:      Result Value   WBC  2.8 (*)    RBC 2.30 (*)    Hemoglobin 7.7 (*)    HCT 23.6 (*)    MCV 102.6 (*)    RDW 19.3 (*)    Platelets 88 (*)    All other components within normal limits  COMPREHENSIVE METABOLIC PANEL WITH GFR - Abnormal; Notable for the following components:   Glucose, Bld 105 (*)    Calcium  8.5 (*)    Albumin 3.1 (*)    All other components within normal limits  PRO BRAIN NATRIURETIC PEPTIDE - Abnormal; Notable for the following components:   Pro Brain Natriuretic Peptide 1,899.0 (*)    All other components within normal limits  I-STAT CHEM 8, ED - Abnormal; Notable for the following components:   BUN 4 (*)    Glucose, Bld 104 (*)    Hemoglobin 7.8 (*)    HCT 23.0 (*)    All other components within normal limits  I-STAT VENOUS BLOOD GAS, ED - Abnormal; Notable for the following components:   pH, Ven 7.441 (*)    pCO2, Ven 38.9 (*)    pO2, Ven 26 (*)    HCT 25.0 (*)    Hemoglobin 8.5 (*)    All other components within normal limits  TROPONIN T, HIGH SENSITIVITY - Abnormal; Notable for the following components:   Troponin T High Sensitivity 51 (*)    All other components within normal limits  TROPONIN T, HIGH SENSITIVITY - Abnormal; Notable for the following components:   Troponin T High Sensitivity 48 (*)    All other components within normal limits    EKG: EKG Interpretation Date/Time:  Monday April 30 2024 11:26:12 EST Ventricular Rate:  106 PR Interval:  156 QRS Duration:  125 QT Interval:  360 QTC Calculation: 478 R Axis:   75  Text Interpretation: Sinus tachycardia Right bundle branch block Confirmed by Freddi Hamilton [phone]) on 04/30/2024 11:27:11 AM  Radiology: [facility] Chest Portable 1 View Result Date: 04/30/2024 EXAM: 1 VIEW(S) XRAY OF THE CHEST 04/30/2024 11:33:09 AM COMPARISON: AP radiograph of the chest dated 01/22/2018. CLINICAL HISTORY: dyspnea FINDINGS: LUNGS AND PLEURA: There is diffuse hazy opacification of the lungs, more pronounced caudally, likely representing  combination of edema and pleural effusions. No pneumothorax. HEART AND MEDIASTINUM: Cardiomediastinal silhouette at upper limits of normal. Status post median sternotomy and cardiac valve replacement. BONES AND SOFT TISSUES: Status post median sternotomy and cardiac valve replacement. IMPRESSION: 1. Diffuse hazy opacification of the lungs, more pronounced caudally, likely representing a combination of pulmonary edema and pleural effusions. 2. Cardiomediastinal silhouette at the upper limits of normal. 3. Status post median sternotomy and cardiac valve replacement. Electronically signed by: Evalene Coho MD 04/30/2024 12:53 PM EST RP Workstation: [id number]   CT Soft Tissue Neck W Contrast Result Date: 04/30/2024 EXAM: CT NECK WITH  CONTRAST 04/30/2024 12:35:06 PM TECHNIQUE: CT of the neck was performed with the administration of intravenous contrast. 75 mL of iohexol  (OMNIPAQUE ) 350 MG/ML injection was administered. Multiplanar reformatted images are provided for review. Automated exposure control, iterative reconstruction, and/or weight based adjustment of the mA/kV was utilized to reduce the radiation dose to as low as reasonably achievable. COMPARISON: None available. CLINICAL HISTORY: Epiglottitis or tonsillitis suspected. FINDINGS: AERODIGESTIVE TRACT: No discrete mass. No edema. There is no evidence of epiglottitis or peritonsillar abscess. SALIVARY GLANDS: The parotid and submandibular glands are unremarkable. THYROID : Unremarkable. LYMPH NODES: No suspicious cervical lymphadenopathy. There are several shotty cervical lymph nodes also present. SOFT TISSUES: No mass or fluid collection. There is surrounding of the subcutaneous fat within the left neck. 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 OTHER: Visualized sinuses and mastoid air cells are well aerated. There are  bilateral pleural effusions present. IMPRESSION: 1. Stranding of the subcutaneous fat in the left neck with several shotty cervical lymph nodes, nonspecific but can be seen with cellulitis/inflammation; no drainable fluid collection. 2. No evidence of epiglottitis or peritonsillar abscess. 3. Bilateral pleural effusions. Electronically signed by: Evalene Coho MD 04/30/2024 12:50 PM EST RP Workstation: [id number]     Procedures   Medications Ordered in the ED  clindamycin  (CLEOCIN ) capsule 300 mg (has no administration in time range)  iohexol  (OMNIPAQUE ) 350 MG/ML injection 75 mL (75 mLs Intravenous Contrast Given 04/30/24 1232)                                    Medical Decision Making Amount and/or Complexity of Data Reviewed External Data Reviewed: labs, radiology and notes.    Details: [facility] discharge summary and workup Labs: ordered.    Details: Elevated BNP and troponin.  Leukopenia Radiology: ordered and independent interpretation performed.    Details: No neck mass ECG/medicine tests: ordered and independent interpretation performed.    Details: Sinus tachycardia  Risk Prescription drug management. Decision regarding hospitalization.   Patient's airway has been stable.  To the sister at the bedside it seems like his neck is getting a little less swollen.  He seems to be getting better.  She is primarily concerned because of he is still not quite back to normal but it seems like this was starting while he was in the hospital [facility].  I do not know how much of this is just aftereffects of being in the hospital for several weeks and being in the ICU.  However he is still short of breath on exertion and had some difficulty walking to the bathroom due to shortness of breath, though a pulse ox was not captured.  He does have some evidence of fluid overload with a little bit of edema in his legs and the workup here in the ED.  Will probably need some diuresis though chart review shows he  did have some issues with hypotension from diuresis.  Patient is a complicated patient.  Sister is preferring not to go back to [facility] at this time.  Discussed with the internal medicine teaching service who will evaluate patient for admission.  I do not think his airway is a concern at this time.  Will put him on clindamycin  as opposed to the Augmentin  he is on for the neck lymphadenopathy in case this is infectious.  No signs of cellulitis on exam.     Final diagnoses:  Lymphadenopathy    ED Discharge Orders     None          Freddi Hamilton, MD 04/30/24 1529  "

## 2024-04-30 NOTE — H&P (Addendum)
 " Date: 04/30/2024               Patient Name:  Derrick Marshall MRN: [id number]  DOB: June 19, 1980 Age / Sex: 44 y.o., male   PCP: Fernand Fredy RAMAN, MD         Medical Service: Internal Medicine Teaching Service         Attending Physician: Dr. Reyes Fenton      First Contact: Rebecka Pion, DO}    Second Contact: Dr. Toma Edwards, DO         Pager Information: First Contact Pager: [phone]   Second Contact Pager: [phone]   SUBJECTIVE   Chief Complaint: Left-sided neck swelling and SOB  History of Present Illness: Derrick Marshall is a 44 y.o. male with PMH of Down syndrome, recent admission for sepsis and acute hypoxic respiratory failure 2/2 CAP with extubation on 12/21, tetralogy of Fallot s/p repair (1984), new RV dysfunction, severe intellectual disability, Hx of depression, ASD, ADHD presented with left-sided neck swelling and SOB.  Patient's speech was unintelligible.  Sister, who is a medical guardian, was present at bedside and from whom majority of the history was obtained.  Sister reports that patient is alert to name and birthday at baseline.  He is also very active and does the following independently at baseline: Eating, using the bathroom, moving around.   Sister reports that patient was discharged from [facility] on Friday (04/27/2024).  She noticed bilateral neck swelling that started on Saturday.  The neck swelling worsened on the left side on Sunday.  Sister was unsure if he had any pain in the neck as patient is known to report pain throughout his body for the slightest pain he experiences.  Right-sided neck swelling resolved today.  Patient has not been endorsing any fevers.  Patient has not been eating much since his discharge from [facility].  However, sister noticed that he was unable to swallow the drink given to him today and that it just started drooling outside of his mouth.  His voice has also been more strained, but this was also present during his recent d/c.   Sister  reports that patient has been feeling dizzy, which has been ongoing since his previous admission.  Patient had complained about chest pain once, however, was unclear about the pain.  Patient gets SOB when he gets up and walks-sister reports, this was also present while he was at [facility] and was likely due to deconditioning during his admission.  He has a history of sleep apnea.  His snoring has worsened along with increased work of breathing while he tries to get up from a laying position or while he is trying to walk around.  Sister reports patient was unable to get up by himself this a.m. Patient also endorses swelling in bilateral feet which also started in the past 2 days.  Sister denies patient having any abdominal pain, vomiting.  Patient does have dysuria, however, sister reports he was on Foley multiple times at [facility] which could be the reason for it.  Sister denies patient having any gross hematuria.  Per sister, patient has appeared tired and fatigued since his discharge from [facility].  He has required assistance in moving around and has had 2 falls since discharge without any injuries to head.  Sister reports that patient did not get any HH, outpatient PT/OT F/U recommendations during discharge at [facility].   ED Course: WBC: 2.8 (4.4 prev admission) Hgb: 7.7 (7.4 prev admission) MCV: 102.6 Plt:  88 (64 prev admission) Trop: 51>>>> 48 Bnp: 1899  Ven: pH: 7.441H; pCO2: 38.9L, pO2: 26  Imaging: CT neck:1. Stranding of the subcutaneous fat in the left neck with several shotty cervical lymph nodes, nonspecific but can be seen with cellulitis/inflammation; no drainable fluid collection. 2. No evidence of epiglottitis or peritonsillar abscess. 3. Bilateral pleural effusions.  CXR:1. Diffuse hazy opacification of the lungs, more pronounced caudally, likely representing a combination of pulmonary edema and pleural effusions.   Meds:  Sister brought in the following medications: Bentropin 0.5 a  day Oxcarbazepine  300 daily BID  Temazapenm 30 at bedtime (15 mg here)  Atorvastatin  40 mg daily Omeprazole 20 mg every other day  PRN thorazine  100mg :0.5 tablet QID PRN Augmentin  BID for 3 days ( 2 tablets left)   Active Medications[1]  Past Medical History Down syndrome, recent admission for sepsis and acute hypoxic respiratory failure 2/2 CAP with extubation on 12/21, tetralogy of Fallot s/p repair (1984), new RV dysfunction, severe intellectual disability, Hx of depression, ASD, ADHD presented with left-sided neck swelling and SOB  Past Surgical History Past Surgical History:  Procedure Laterality Date   TETRALOGY OF FALLOT REPAIR       Social:  Lives at: Assisted family living  Functionality (since d/c from [place] on 04/27/2024): SOB with activity, needs constant assistance to move around, unable to walk by himslef or do any ADLS  Smoker: second hand smoke exposed Alcohol: none No illicit Drugs PCP: [facility]  PCP:  Fernand Fredy RAMAN, MD  Substances: -Tobacco: None but exposed to secondhand smoke from grandparents in childhood -Alcohol: None -Recreational Drug: None  Family History:  History reviewed. No pertinent family history.   Allergies: Allergies as of 04/30/2024 - Review Complete 04/30/2024  Allergen Reaction Noted   Ceftriaxone Dermatitis and Other (See Comments) 04/16/2024   Celexa [citalopram hydrobromide] Other (See Comments) 12/31/2017   Zyprexa  [olanzapine ] Other (See Comments) 12/31/2017   Citalopram Other (See Comments) 12/31/2017    Review of Systems: A complete ROS was negative except as per HPI.   OBJECTIVE:   Physical Exam: Blood pressure 132/83, pulse 98, temperature 98.1 F (36.7 C), resp. rate 20, SpO2 100%.  Constitutional: Breathing comfortably on RA, however breathing was noisy, in no acute distress HENT: Craniofacial features consistent with trisomy 21, atraumatic head, mucous membranes moist Neck: Soft with mild swelling on left  side of the neck, difficult to appreciate lymph nodes if present due to body habitus Cardiovascular: Mild tachycardia, murmur noted on LUSB, LLSB Pulmonary/Chest: normal work of breathing on room air, lungs clear to auscultation bilaterally Abdominal: soft, non-tender, non-distended, no guarding, no rebound Neurological: alert & oriented to self and birthday, able to follow commands physical exam, able to move upper extremities well. Skin: warm and dry  Labs: CBC    Component Value Date/Time   WBC 2.8 (L) 04/30/2024 1107   RBC 2.30 (L) 04/30/2024 1107   HGB 7.8 (L) 04/30/2024 1121   HGB 8.5 (L) 04/30/2024 1121   HCT 23.0 (L) 04/30/2024 1121   HCT 25.0 (L) 04/30/2024 1121   PLT 88 (L) 04/30/2024 1107   MCV 102.6 (H) 04/30/2024 1107   MCH 33.5 04/30/2024 1107   MCHC 32.6 04/30/2024 1107   RDW 19.3 (H) 04/30/2024 1107   LYMPHSABS 0.7 04/30/2024 1107   MONOABS 0.1 04/30/2024 1107   EOSABS 0.0 04/30/2024 1107   BASOSABS 0.1 04/30/2024 1107     CMP     Component Value Date/Time   NA 139  04/30/2024 1121   NA 139 04/30/2024 1121   K 4.1 04/30/2024 1121   K 4.1 04/30/2024 1121   CL 103 04/30/2024 1121   CO2 27 04/30/2024 1107   GLUCOSE 104 (H) 04/30/2024 1121   BUN 4 (L) 04/30/2024 1121   CREATININE 0.90 04/30/2024 1121   CALCIUM  8.5 (L) 04/30/2024 1107   PROT 6.5 04/30/2024 1107   ALBUMIN 3.1 (L) 04/30/2024 1107   AST 26 04/30/2024 1107   ALT 32 04/30/2024 1107   ALKPHOS 120 04/30/2024 1107   BILITOT 0.5 04/30/2024 1107   GFRNONAA >60 04/30/2024 1107   GFRAA >60 01/31/2018 0539    Imaging: DG Chest Portable 1 View Result Date: 04/30/2024 EXAM: 1 VIEW(S) XRAY OF THE CHEST 04/30/2024 11:33:09 AM COMPARISON: AP radiograph of the chest dated 01/22/2018. CLINICAL HISTORY: dyspnea FINDINGS: LUNGS AND PLEURA: There is diffuse hazy opacification of the lungs, more pronounced caudally, likely representing combination of edema and pleural effusions. No pneumothorax. HEART AND  MEDIASTINUM: Cardiomediastinal silhouette at upper limits of normal. Status post median sternotomy and cardiac valve replacement. BONES AND SOFT TISSUES: Status post median sternotomy and cardiac valve replacement. IMPRESSION: 1. Diffuse hazy opacification of the lungs, more pronounced caudally, likely representing a combination of pulmonary edema and pleural effusions. 2. Cardiomediastinal silhouette at the upper limits of normal. 3. Status post median sternotomy and cardiac valve replacement. Electronically signed by: Evalene Coho MD 04/30/2024 12:53 PM EST RP Workstation: [id number]   CT Soft Tissue Neck W Contrast Result Date: 04/30/2024 EXAM: CT NECK WITH CONTRAST 04/30/2024 12:35:06 PM TECHNIQUE: CT of the neck was performed with the administration of intravenous contrast. 75 mL of iohexol  (OMNIPAQUE ) 350 MG/ML injection was administered. Multiplanar reformatted images are provided for review. Automated exposure control, iterative reconstruction, and/or weight based adjustment of the mA/kV was utilized to reduce the radiation dose to as low as reasonably achievable. COMPARISON: None available. CLINICAL HISTORY: Epiglottitis or tonsillitis suspected. FINDINGS: AERODIGESTIVE TRACT: No discrete mass. No edema. There is no evidence of epiglottitis or peritonsillar abscess. SALIVARY GLANDS: The parotid and submandibular glands are unremarkable. THYROID : Unremarkable. LYMPH NODES: No suspicious cervical lymphadenopathy. There are several shotty cervical lymph nodes also present. SOFT TISSUES: No mass or fluid collection. There is surrounding of the subcutaneous fat within the left neck. 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 OTHER: Visualized sinuses and mastoid air cells are well aerated. There are bilateral pleural effusions present. IMPRESSION: 1. Stranding of the  subcutaneous fat in the left neck with several shotty cervical lymph nodes, nonspecific but can be seen with cellulitis/inflammation; no drainable fluid collection. 2. No evidence of epiglottitis or peritonsillar abscess. 3. Bilateral pleural effusions. Electronically signed by: Evalene Coho MD 04/30/2024 12:50 PM EST RP Workstation: [id number]     EKG: personally reviewed my interpretation is sinus tachycardia. Prior EKG shows the same  ASSESSMENT & PLAN:   Assessment & Plan by Problem: Principal Problem:   Cellulitis of neck Active Problems:   Tetralogy of Fallot   Dysphagia   Derrick Marshall is a 44 y.o. person living with a history of Down syndrome, recent admission for sepsis and acute hypoxic respiratory failure 2/2 CAP with extubation on 12/21, tetralogy of Fallot s/p repair (1984), new RV dysfunction, severe intellectual disability, Hx of depression, ASD, ADHD presented with left-sided neck swelling and SOB who presented with left-sided neck swelling and SOB and admitted for cellulitis  of neck on hospital day 0  Cellulitis on L side of neck Patient presents with left-sided neck swelling that has been ongoing since the past 2 days with improvement.  Differential diagnosis includes but is not limited to left neck swelling due to cellulitis, recent extubation, peritonsillar abscess, epiglottitis.  Imaging was negative for peritonsillar abscess, epiglottitis.  Imaging, however, did show fat stranding that could be due to cellulitis or inflammation.  Patient is afebrile with no elevations in WBC count.  His symptoms are likely consistent with new cellulitis or inflammation from recent extubation. Pt developed rash during previous admission which was thought to be due to ctx, as discussed below. Will avoid cephalosporins for now but can start if required. Pt was also discharged from [facility] on Augmentin  to complete a 3 day course. He was on augmentin  course but still developed the possible  cellulitis, so will tx him with different regimen. Currently, will treat him with linezolid  for MRSA coverage and Levaquin  for gram-negative coverage, partial anaerobic coverage.  Also place SLP evaluation for any swallowing difficulty.  - Start Levaquin , linezolid  -SLP evaluation requested  Down syndrome ADHD, ASD, per charting Patient on Thorazine , Restoril  (temazepam ), benztropine  per charting.  During previous admission patient's confusion was attributed to the centrally acting drugs as well.  However, per charting patient sister reported these medications were trialed and started by outpatient psychiatry.  These medications were resumed during previous admission.  Will continue treating with these home medications and watch for any worsening of her symptoms.  -Start home benztropine  0.5 mg twice daily - Start home Thorazine  100 mg 4 times daily as needed for severe aggression -Start temazepam  15 mg daily --Start Oxcarbezapine  Ongoing SOB Deconditioning Recent sepsis, acute hypoxic respiratory failure 2/2 pneumonia Hx sleep apnea Patient has ongoing SOB on movement and while trying to get up.  This has been present since his d/c from [facility] on 04/27/2024.  DDx includes but is not limited to recent deconditioning, Hx sleep apnea, RV dysfunction.  Unsure what is exactly causing his symptoms, however, do think it is a combination of all the conditions mentioned in the previous sentence.  Patient was admitted for sepsis due to pneumonia in December at [facility] which was complicated by him requiring to be intubated.  He underwent deconditioning during this admission likely due to severity of his condition along with complicated medical history.  Patient also appears to be mildly volume overloaded with BL LE edema.  Will treat with 1 dose of Lasix .  Patient is not on supplemental oxygen  or CPAP/BiPAP at home.  Sister mentions that patient needs BiPAP at home with worsening of snoring and SOB, however, reports  patient will be unable to sit still during the sleep study.  Per charting, provider from previous admission mentioned that patient's family wanted to discharge patient despite not having conclusive answer to nighttime ventilation.  Sister also mentions that patient was not discharged with appropriate outpatient follow-up PT/OT, HH.  Patient will require PT/OT evaluation at present to determine final disposition.  Discussed with sister that we do not provide sleep studies while he remains in-patient and that patient needs to follow-up outpatient with a potential referral to pulmonologist as well.  - Lasix  40 mg - PT/OT evaluation -Extended respiratory pathogen panel f/u -OP F/U with pulmonology for potential sleep study  Tetralogy of Fallot s/p repair (1984) Hx of hypotension Per charting, TEE from previous recent admission showed normal LVEF, mild pulmonary insufficiency, mild RV dysfunction.  Cardiology was  consulted that and mentioned that pulmonic regurg and mild RV were unlikely to be contributing to patient's respiratory failure.  They recommended maintaining euvolemia and caution against aggressive diuresis as patient has mildly preload dependent RV.  Patient also had ongoing hypotension during previous admission while sleeping which caused intermittent restarting of pressors.  At present, will trial with 1 dose of Lasix  to help with volume overload and observe. -Recommend being cautious with diuresis due to Hx of hypertension  Dysuria Sister reports patient complained of dysuria.  However, reports this could be due to multiple Foley placements during previous admission.  Will monitor for more symptoms and look for signs of infection or hematuria.  Macrocytic anemia Thrombocytopenia Present during previous admission.  Will transfuse if hemoglobin levels drop below 7.  Patient had extensive workup including negative myeloid panel during recent admission.  Acute cytopenias could be due to recent  critical illness from which the patient is still recovering or due to home medications like carbamazepine.  Hemoglobin remained the same since previous admission, platelet levels have improved.  For now we will continue with home carbamazepine.  Will trend daily CBCs and start folate, B12 supplementation if needed.  --Trend CBCs daily -Transfuse if hgb <7  Constipation Sister reports extensive stool burden during previous admission which was treated with laxatives.  Reports he has been having normal BM since his discharge.   -Start MiraLAX  daily   Recent exanthematous medication reaction, resolved  Patient developed maculopapular rash on chest, abdomen, arms, legs which resolved during his admission at [facility].  Rash was thought to be due to ceftriaxone.  Dermatology was consulted and recommended the patient can get cephalosporins if required as rash was self resolving.  Best practice: Diet: NPO VTE: Enoxaparin  Code: Full  Disposition planning: Prior to Admission Living Arrangement: Assisted family living facility Anticipated Discharge Location: Assisted family living facility  Dispo: Admit patient to Observation with expected length of stay less than 2 midnights.  Signed: Edgardo Pontiff, DO Internal Medicine Resident  04/30/2024, 6:04 PM  On Call pager: [phone]      [1]  Current Meds  Medication Sig   amoxicillin -clavulanate (AUGMENTIN ) 875-125 MG tablet Take 1 tablet by mouth 2 (two) times daily.   atorvastatin  (LIPITOR) 40 MG tablet Take 1 tablet (40 mg total) by mouth daily at 6 PM.   benztropine  (COGENTIN ) 1 MG tablet Take 0.5 mg by mouth 2 (two) times daily.   chlorproMAZINE  (THORAZINE ) 100 MG tablet Take 100 mg by mouth 4 (four) times daily as needed (severe aggression).   INVEGA SUSTENNA 234 MG/1.5ML injection Inject 234 mg into the muscle once.   omeprazole (PRILOSEC) 20 MG capsule Take 20 mg by mouth every other day.   Oxcarbazepine  (TRILEPTAL ) 300 MG tablet Take 300 mg by  mouth 2 (two) times daily.   temazepam  (RESTORIL ) 30 MG capsule Take 30 mg by mouth at bedtime.   "

## 2024-04-30 NOTE — ED Notes (Signed)
 Pt does not tolerate monitoring equipment well.  Tends to pull off.  Also has mitt on L.H. to prevent pulling at IV in R. H.

## 2024-04-30 NOTE — ED Notes (Signed)
 Vbg results to josh n.rn by at

## 2024-04-30 NOTE — ED Notes (Signed)
Malewick placed on pt

## 2024-04-30 NOTE — ED Triage Notes (Signed)
 Pt BIB [facility] with c/o SHOB, sister reports swelling on left side of face and neck, sounds more hoarse that usual. Hx down syndrome, tetralogy fallot. Recently hospitalized at [facility] for sepsis and pneumonia.  120/70 Hr 110 90-94% on room air

## 2024-04-30 NOTE — ED Notes (Signed)
 Report given to receiving RN.

## 2024-04-30 NOTE — Hospital Course (Addendum)
 WBC: 2.8 Hgb: 7.7 Plt: 88 Trop: 51 Bnp: 1899  Ven: pH: 7.441H; pCO2: 38.9L, pO2: 26 HR: 107; RR: 26  DS, tet of Fallot, autism? D/c [facility] 3 days ago--PNA, Fluid Overload et jaynie; Left neck swollen, likely fluid overloaded? NAD, not hypoxic; can get hypotensive--be careful with diuresis as it can worsen with his TTF;; not septic, neck was swollen, still SOB;   ========================================== - Started Saturday  - Dis[facility] on Friday  - Left neck swelling started on Saturday, primary on this side getting bigger in size - No fevers, has not been eating much. This morning, he was not swallowing the drink, just droll out side of his mouth - Tired and fatigued, needs assistance, had two falls on his butt (since his discharge) - Feels dizzy ( same),  - complained of chest pain once, unclear  - When he gets up and walks, gets SOB > was deconditioned in the hospital > no HH PT/OT (lives with assisted living, last time they came, COVID.  - He could not get up by himself this morning  - Can vomit on cue ( on purpose)  - Worsening snoring, increase work of breathing  - No abdominal pain, no vomiting.  - Reports dysuria ( was on foley multiple times in the [facility]), no gross hematuria  - Foot swelling ( both legs)  - He is sleeping more through out the day  - Voice is high pitched (present prior to discharge)  - wet cough (at the hospital)  CPAP will benefit d/t to floppy airway - Awake and momo and DOB.    PE: - breathing nasally  -   Social: Lives with Asiisted family living  Functionality: SOB with activity, assit needs, unable to walk by himslef none ADLS  Smoker: second hand smoke exposed Alcohol: none No illicit Drugs PCP: [facility]    Medications: Sustenna Inviga 12/13 last dose ( received four injections)    Medications: Bentropin 0.5 a day Oxcarbazepine  300 daily BID  Temazapenm 30 at bedtime (15 mg here)  Atorvastatin  40 mg  daily Omeprazole 20 mg every other day  PRN thorazine  100, 0.5 tablet QID PRN Augmentin  BID for 3 days ( 2 tablets left)   SLP:   [name] > sister and Full guardian  Code Status : Full    Extebded RSV, SLP,   ===================== Skin and soft tissue >  - Linezolid   ==================  Neck cellulitis >  BNP  TEE 12/8 >  right ventricle is mildly to moderately dilated in size, with mildly reduced systolic function, mild TR and mild PR   12/18 Bronch > 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  Vancomycin  and Meropenem narrowed to CTX/Azithromycin for CAP coverage 12/18; changed to Levofloxacin  12/20 given concern for antibiotic related rash with CTX/Azith prior, finished 7 days of antibiotics on 12/21.    S/p Unasyn  (12/26-12/30); restart Unasyn  (12/31-)  == BiPAP overnight   ==  I am ordering NIV therapy because of its volume targeting AVAPS AE mode and faster responding AVAPS rate.  == Neuromuscular weakness   =============== - 750 mL and 950 mL

## 2024-04-30 NOTE — Care Plan (Signed)
 [facility] Care Referral Clinical Care Management phone [phone] fax [phone]  Demographics Patient Name: Derrick Marshall Date of Birth: 07-13-1980 Gender: Male [facility] Medical Record #: [id number] Billing Address: [address] [place] [address] Destination Address: Mailing Address:  [address] [place] [address]  Anticipated Discharge Date: 04/28/23 Medicare homebound criteria met? Yes Known concerns about discharge environment? No Right to Choice document & provider list(s) provided and discussed with patient/family? Yes Teachable caregiver identified: Caregiver: Extended Emergency Contact Information Primary Emergency Contact: Montgomery,Linda Address: [address]          [place], [place] [address] [place] Home Phone: [phone] Mobile Phone: [phone] Relation: Sister Additional Information: N/A Language Barrier (if yes, identify language): No  This Document is a Referral Only Additional information, including specific home care orders and instructions, will be provided in the electronic discharge summary.   Clinical Condition []  See attached medical records Admitting Diagnosis:  Sepsis, due to unspecified organism, unspecified whether acute organ dysfunction present (CMS-HCC) [A41.9] Acute hypoxic respiratory failure    (CMS-HCC) [J96.01] Past Medical History:   has a past medical history of Developmental delay, Down syndrome (HHS-HCC), History of artificial heart valve, and Tetralogy of Fallot (HHS-HCC). Past Surgical History:   has a past surgical history that includes Cardiac surgery.   Name of MD signing discharge orders and contact info:  Primary Care Physician: Alyse Slater Pao, MD  Services Requested [x]  New Referral []  Resumption of Care  []  This is patient at risk for readmission  Physical Therapy: Please evaluate and treat. NOTE: Weightbearing: full  weightbearing Occupational Therapy: Please evaluate and treat  Additional Order Information:   Patient has downs syndrome.  Sister Derrick Marshall is good contact and very involved in care  Requested Start of Care [facility]):  Next available  CCM Case Manager to electronically sign referral when complete.  Deirdre E Lindegaard

## 2024-04-30 NOTE — Care Plan (Signed)
"       °  Care Management Final Transition Planning Assessment    04/27/2024 [phone] (mobile)  Future Appointments  Date Time Provider Department Center  08/30/2024  2:00 PM Alyse Slater Pao, MD [facility]                                              Transportation Anticipated: family or friend will provide    Currently receiving outpatient dialysis?: No       Discharge Disposition: Home w/ Home Health     IMM Delivery Follow Up Important Message (IM) letter given to patient and/or family?: Yes IMM Delivered by: The IMM was delivered and signed by authorized representative., The IMM was also mailed/faxed to: IMM was also sent to:: [address] [place], [place] [address]. IMM Delivery Date: 04/27/24 IMM Delivery Time: 1535 The beneficiary verbalized understanding of the rights of the hospitalized patient and the right to appeal the discharge decision?: Yes The patient/authorized representative was advised they have 4 hours to consider their right to request a QIO review prior to leaving the hospital, if the physician orders discharge today.: Yes The patient/authorized representative verbalized understanding of this right.: Yes  Quality data for continuing care services shared with patient and/or representative?: N/A Patient and/or family were provided with choice of facilities / services that are available and appropriate to meet post hospital care needs?: Other (Comment) (CM will provide choice of facilities/services if deemed medically necessary.)       Final Assessment Complete: Yes                     Readmission Risk Score:  Predictive Model Details        22%  Factor Value   Calculated 04/27/2024 16:00 19% Number of active inpatient medication orders 30   [facility] Risk of Unplanned Readmission Model 16% Current length of stay 17.985 days   *Archived Data 8% Active antipsychotic inpatient medication order present    8% ECG/EKG  order present in last 6 months    8% Latest calcium  low (8.5 mg/dL)    7% Restraint order present in last 6 months    6% Imaging order present in last 6 months    5% Latest hemoglobin low (7.4 g/dL)    5% Phosphorous result present    5% Number of ED visits in last six months 1    4% Active anticoagulant inpatient medication order present    4% Active corticosteroid inpatient medication order present    3% Age 44    2% Future appointment scheduled    1% Active ulcer inpatient medication order present    "

## 2024-05-01 ENCOUNTER — Observation Stay (HOSPITAL_COMMUNITY)

## 2024-05-01 ENCOUNTER — Other Ambulatory Visit: Payer: Self-pay

## 2024-05-01 DIAGNOSIS — J9611 Chronic respiratory failure with hypoxia: Secondary | ICD-10-CM | POA: Diagnosis present

## 2024-05-01 DIAGNOSIS — D696 Thrombocytopenia, unspecified: Secondary | ICD-10-CM

## 2024-05-01 DIAGNOSIS — K5900 Constipation, unspecified: Secondary | ICD-10-CM | POA: Diagnosis present

## 2024-05-01 DIAGNOSIS — Z8719 Personal history of other diseases of the digestive system: Secondary | ICD-10-CM | POA: Diagnosis not present

## 2024-05-01 DIAGNOSIS — J9601 Acute respiratory failure with hypoxia: Secondary | ICD-10-CM | POA: Diagnosis not present

## 2024-05-01 DIAGNOSIS — Q909 Down syndrome, unspecified: Secondary | ICD-10-CM | POA: Diagnosis not present

## 2024-05-01 DIAGNOSIS — G4730 Sleep apnea, unspecified: Secondary | ICD-10-CM | POA: Diagnosis present

## 2024-05-01 DIAGNOSIS — Z8774 Personal history of (corrected) congenital malformations of heart and circulatory system: Secondary | ICD-10-CM | POA: Diagnosis not present

## 2024-05-01 DIAGNOSIS — F909 Attention-deficit hyperactivity disorder, unspecified type: Secondary | ICD-10-CM | POA: Diagnosis present

## 2024-05-01 DIAGNOSIS — Z79899 Other long term (current) drug therapy: Secondary | ICD-10-CM | POA: Diagnosis not present

## 2024-05-01 DIAGNOSIS — R1310 Dysphagia, unspecified: Secondary | ICD-10-CM

## 2024-05-01 DIAGNOSIS — Z8701 Personal history of pneumonia (recurrent): Secondary | ICD-10-CM | POA: Diagnosis not present

## 2024-05-01 DIAGNOSIS — A419 Sepsis, unspecified organism: Secondary | ICD-10-CM | POA: Diagnosis not present

## 2024-05-01 DIAGNOSIS — L03221 Cellulitis of neck: Secondary | ICD-10-CM | POA: Diagnosis present

## 2024-05-01 DIAGNOSIS — L03211 Cellulitis of face: Secondary | ICD-10-CM | POA: Diagnosis not present

## 2024-05-01 DIAGNOSIS — Q213 Tetralogy of Fallot: Secondary | ICD-10-CM | POA: Diagnosis not present

## 2024-05-01 DIAGNOSIS — F840 Autistic disorder: Secondary | ICD-10-CM | POA: Diagnosis present

## 2024-05-01 DIAGNOSIS — R0602 Shortness of breath: Secondary | ICD-10-CM

## 2024-05-01 DIAGNOSIS — R591 Generalized enlarged lymph nodes: Secondary | ICD-10-CM | POA: Diagnosis present

## 2024-05-01 DIAGNOSIS — D709 Neutropenia, unspecified: Secondary | ICD-10-CM

## 2024-05-01 DIAGNOSIS — J189 Pneumonia, unspecified organism: Secondary | ICD-10-CM | POA: Diagnosis not present

## 2024-05-01 DIAGNOSIS — I10 Essential (primary) hypertension: Secondary | ICD-10-CM | POA: Diagnosis present

## 2024-05-01 DIAGNOSIS — D539 Nutritional anemia, unspecified: Secondary | ICD-10-CM

## 2024-05-01 DIAGNOSIS — F32A Depression, unspecified: Secondary | ICD-10-CM | POA: Diagnosis present

## 2024-05-01 DIAGNOSIS — R300 Dysuria: Secondary | ICD-10-CM | POA: Diagnosis present

## 2024-05-01 DIAGNOSIS — Z888 Allergy status to other drugs, medicaments and biological substances status: Secondary | ICD-10-CM | POA: Diagnosis not present

## 2024-05-01 DIAGNOSIS — G709 Myoneural disorder, unspecified: Secondary | ICD-10-CM | POA: Diagnosis present

## 2024-05-01 DIAGNOSIS — E8770 Fluid overload, unspecified: Secondary | ICD-10-CM | POA: Diagnosis present

## 2024-05-01 DIAGNOSIS — R6520 Severe sepsis without septic shock: Secondary | ICD-10-CM | POA: Diagnosis not present

## 2024-05-01 DIAGNOSIS — R1312 Dysphagia, oropharyngeal phase: Secondary | ICD-10-CM | POA: Diagnosis present

## 2024-05-01 DIAGNOSIS — F72 Severe intellectual disabilities: Secondary | ICD-10-CM | POA: Diagnosis present

## 2024-05-01 LAB — MAGNESIUM: Magnesium: 1.8 mg/dL (ref 1.7–2.4)

## 2024-05-01 LAB — CBC
HCT: 25 % — ABNORMAL LOW (ref 39.0–52.0)
Hemoglobin: 8.1 g/dL — ABNORMAL LOW (ref 13.0–17.0)
MCH: 33.2 pg (ref 26.0–34.0)
MCHC: 32.4 g/dL (ref 30.0–36.0)
MCV: 102.5 fL — ABNORMAL HIGH (ref 80.0–100.0)
Platelets: 93 K/uL — ABNORMAL LOW (ref 150–400)
RBC: 2.44 MIL/uL — ABNORMAL LOW (ref 4.22–5.81)
RDW: 19.9 % — ABNORMAL HIGH (ref 11.5–15.5)
WBC: 2.2 K/uL — ABNORMAL LOW (ref 4.0–10.5)
nRBC: 0 % (ref 0.0–0.2)

## 2024-05-01 LAB — RESPIRATORY PANEL BY PCR

## 2024-05-01 LAB — BASIC METABOLIC PANEL WITH GFR
Anion gap: 8 (ref 5–15)
BUN: 6 mg/dL (ref 6–20)
CO2: 27 mmol/L (ref 22–32)
Calcium: 8.4 mg/dL — ABNORMAL LOW (ref 8.9–10.3)
Chloride: 104 mmol/L (ref 98–111)
Creatinine, Ser: 1.01 mg/dL (ref 0.61–1.24)
GFR, Estimated: >60 mL/min
Glucose, Bld: 105 mg/dL — ABNORMAL HIGH (ref 70–99)
Potassium: 3.8 mmol/L (ref 3.5–5.1)
Sodium: 140 mmol/L (ref 135–145)

## 2024-05-01 NOTE — Evaluation (Signed)
 Clinical/Bedside Swallow Evaluation Patient Details  Name: Derrick Marshall MRN: [id number] Date of Birth: July 08, 1980  Today's Date: 05/01/2024 Time: SLP Start Time (ACUTE ONLY): 0950 SLP Stop Time (ACUTE ONLY): 1008 SLP Time Calculation (min) (ACUTE ONLY): 18 min  Past Medical History:  Past Medical History:  Diagnosis Date   Autism    Down syndrome    Tetralogy of Fallot    Past Surgical History:  Past Surgical History:  Procedure Laterality Date   TETRALOGY OF FALLOT REPAIR     HPI:  Derrick Marshall is a 44 y.o. male who presented with left-sided neck swelling and SOB. Soft tissue CT suggestive of cellulitis of neck. CXE 1/5 with edema/effusion. Patient was discharged from [facility] on Friday (04/27/2024) with ETT 12/17-21. Clinical swallow eval 12/23 with regular diet initation. Sister reports changes to swallowing since that admission, not just with new neck swelling. Pt with PMH of Down syndrome, recent admission for sepsis and acute hypoxic respiratory failure 2/2 CAP with extubation on 12/21, tetralogy of Fallot s/p repair (1984), new RV dysfunction, severe intellectual disability, Hx of depression, ASD, ADHD.    Assessment / Plan / Recommendation  Clinical Impression  Pt presents with clinical indicators of pharyngeal dysphagia.  Today pt exhibited delaye cough following solid and one of several trials of thin liquid.  Frequently required multiple swallows (4+ with thin liquids, 2 with puree).  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  Liquid wash was was beneficial to clear. Sister has noted increased difficulty with swallowing with more coughing with pos noted since extubation at [facility], not just with new neck edema.  His vocal quality is very hoarse, but she confirms this is now fairly close to baseline, where he had a thin, high pitched voice pta.  Pt needs softer foods, especially with meats recently per sister. Discussed diet.  Will  initiate regular diet, choosing softer foods per preference so as not to restrict options as pt can be a picky eater. MBS planned for later this date to further assess pharyngeal swallow function. Of note: sister says pt can regurgitate at will if he does not like food.    Recommend regular texture diet with thin liquids.   SLP Visit Diagnosis: Dysphagia, oral phase (R13.11)    Aspiration Risk  Mild aspiration risk    Diet Recommendation Regular;Thin liquid    Liquid Administration via: Cup;Straw Medication Administration: Whole meds with liquid Supervision: Staff to assist with self feeding (assist as needed) Compensations: Slow rate;Small sips/bites Postural Changes: Seated upright at 90 degrees    Other Recommendations Oral Care Recommendations: Oral care BID     Swallow Evaluation Recommendations  TBD   Assistance Recommended at Discharge  N/A  Functional Status Assessment  (TBD)  Frequency and Duration  (TBD)          Prognosis Prognosis for improved oropharyngeal function:  (TBD)      Swallow Study   General Date of Onset: 04/30/24 HPI: Derrick Marshall is a 44 y.o. male who presented with left-sided neck swelling and SOB. Soft tissue CT suggestive of cellulitis of neck. CXE 1/5 with edema/effusion. Patient was discharged from [facility] on Friday (04/27/2024) with ETT 12/17-21. Clinical swallow eval 12/23 with regular diet initation. Sister reports changes to swallowing since that admission, not just with new neck swelling. Pt with PMH of Down syndrome, recent admission for sepsis and acute hypoxic respiratory failure 2/2 CAP with extubation on 12/21, tetralogy of  Fallot s/p repair (1984), new RV dysfunction, severe intellectual disability, Hx of depression, ASD, ADHD. Type of Study: Bedside Swallow Evaluation Previous Swallow Assessment: 12/23 @ [facility] Diet Prior to this Study: NPO Respiratory Status: Room air History of Recent Intubation: No Behavior/Cognition:  Alert;Cooperative;Pleasant mood Oral Cavity Assessment: Within Functional Limits Oral Care Completed by SLP: No Oral Cavity - Dentition: Adequate natural dentition Patient Positioning: Upright in bed Baseline Vocal Quality: Normal Volitional Cough: Strong Volitional Swallow: Unable to elicit    Oral/Motor/Sensory Function Overall Oral Motor/Sensory Function: Mild impairment Facial ROM: Within Functional Limits Facial Symmetry: Within Functional Limits Lingual ROM: Reduced right;Reduced left (2/2 macroglossia) Lingual Symmetry: Within Functional Limits Lingual Strength:  (unable to assess) Velum: Within Functional Limits Mandible: Within Functional Limits   Ice Chips Ice chips: Not tested   Thin Liquid Thin Liquid: Impaired Presentation: Straw Pharyngeal  Phase Impairments: Cough - Delayed;Multiple swallows    Nectar Thick Nectar Thick Liquid: Not tested   Honey Thick Honey Thick Liquid: Not tested   Puree Puree: Within functional limits Presentation: Spoon   Solid     Solid: Impaired Oral Phase Functional Implications: Oral residue Pharyngeal Phase Impairments: Cough - Delayed      Anette FORBES Grippe, MA, CCC-SLP Acute Rehabilitation Services Office: [phone] 05/01/2024,10:37 AM

## 2024-05-01 NOTE — Evaluation (Signed)
 Physical Therapy Evaluation Patient Details Name: Derrick Marshall MRN: [id number] DOB: 1980/07/06 Today's Date: 05/01/2024  History of Present Illness  44 yo M adm 04/30/24 for L side neck swelling, lethargy, and SOB. Pt with workup for cellulitis of neck. PMH: Down syndrome, recent adm 12/21-04/27/24 at [facility] for sepsis and acute hypoxia 2/2 CAP, tetralogy of Fallot s/p repair (1984), new RV dysfunction, severe intellectual disability, depression, ASD, ADHD  Clinical Impression  Pt admitted with above diagnosis. Pt from medical assistance group home and has someone with him at all times. Per his sister, he has not returned to his baseline LOF since admission at [facility]. At baseline he goes out into community with caregiver 3 days a week and ambulates without assistance. Currently needs HHA with ambulation due to mild unsteadiness and fatigues more quickly than his baseline. Will follow acutely and recommend outpt PT at d/c.  Pt currently with functional limitations due to the deficits listed below (see PT Problem List). Pt will benefit from acute skilled PT to increase their independence and safety with mobility to allow discharge.           If plan is discharge home, recommend the following: A little help with walking and/or transfers;A little help with bathing/dressing/bathroom;Assistance with cooking/housework;Assistance with feeding;Assist for transportation;Help with stairs or ramp for entrance;Supervision due to cognitive status   Can travel by private vehicle        Equipment Recommendations None recommended by PT  Recommendations for Other Services       Functional Status Assessment Patient has had a recent decline in their functional status and demonstrates the ability to make significant improvements in function in a reasonable and predictable amount of time.     Precautions / Restrictions Precautions Precautions: Fall Recall of Precautions/Restrictions: Impaired Precaution/Restrictions  Comments: decreased safety due to cognitive status but pt has one on one caregiver assist at home Restrictions Weight Bearing Restrictions Per Provider Order: No      Mobility  Bed Mobility Overal bed mobility: Needs Assistance Bed Mobility: Supine to Sit, Sit to Supine     Supine to sit: Supervision Sit to supine: Supervision   General bed mobility comments: pt able to sit up into long sitting and comes to EOB when his sister cues him and gives him tactile stimulation. Able to lift LE's back into bed to return to supine    Transfers Overall transfer level: Needs assistance Equipment used: 1 person hand held assist Transfers: Sit to/from Stand Sit to Stand: Min assist           General transfer comment: min A for power up    Ambulation/Gait Ambulation/Gait assistance: Min assist, +2 safety/equipment Gait Distance (Feet): 300 Feet Assistive device: 1 person hand held assist, 2 person hand held assist Gait Pattern/deviations: Step-through pattern, Drifts right/left Gait velocity: decreased Gait velocity interpretation: >2.62 ft/sec, indicative of community ambulatory   General Gait Details: single HHA initially, 2 person HHA when he fatigued. After seated rest break at 225' was again able to ambulate with HHA of 1.  Stairs            Wheelchair Mobility     Tilt Bed    Modified Rankin (Stroke Patients Only)       Balance Overall balance assessment: Needs assistance Sitting-balance support: No upper extremity supported, Feet supported Sitting balance-Leahy Scale: Good     Standing balance support: No upper extremity supported Standing balance-Leahy Scale: Poor Standing balance comment: needs HHA. Has been unsteady since  d/c from [facility]                             Pertinent Vitals/Pain Pain Assessment Pain Assessment: Faces Faces Pain Scale: No hurt    Home Living Family/patient expects to be discharged to:: Assisted living Living  Arrangements: Group Home Available Help at Discharge: Available 24 hours/day;Personal care attendant Type of Home: House Home Access: Stairs to enter Entrance Stairs-Rails: Right Entrance Stairs-Number of Steps: 5   Home Layout: Two level;Able to live on main level with bedroom/bathroom Home Equipment: None Additional Comments: pt has someone with him at all times per sister. Caregiver takes him out and about 3 days/wk    Prior Function Prior Level of Function : Needs assist  Cognitive Assist : Mobility (cognitive);ADLs (cognitive) Mobility (Cognitive): Set up cues ADLs (Cognitive): Set up cues Physical Assist : Mobility (physical);ADLs (physical) Mobility (physical): Gait;Stairs ADLs (physical): Feeding;Grooming;Bathing;Dressing;Toileting;IADLs Mobility Comments: ambulated without AD before admission to [facility], has had HHA since that time. Independent on stairs with rail, HHA if no rail ADLs Comments: assist needed     Extremity/Trunk Assessment   Upper Extremity Assessment Upper Extremity Assessment: Defer to OT evaluation    Lower Extremity Assessment Lower Extremity Assessment: Generalized weakness    Cervical / Trunk Assessment Cervical / Trunk Assessment: Normal;Other exceptions Cervical / Trunk Exceptions: continued swelling L side of neck  Communication   Communication Communication: Impaired Factors Affecting Communication: Reduced clarity of speech    Cognition Arousal: Lethargic Behavior During Therapy: WFL for tasks assessed/performed   PT - Cognitive impairments: History of cognitive impairments                       PT - Cognition Comments: needs encouragement to mobilize at baseline. Has been more than lethargic than his baseline since return from [facility]. SIster reports that he basically did not mobilize for 3 weeks at [facility] and is still deconditioned from that. Following commands: Impaired Following commands impaired: Only follows one step commands  consistently     Cueing Cueing Techniques: Verbal cues, Gestural cues, Tactile cues     General Comments General comments (skin integrity, edema, etc.): VSS. Pt fatigued after ambulation    Exercises     Assessment/Plan    PT Assessment Patient needs continued PT services  PT Problem List Decreased strength;Decreased activity tolerance;Decreased balance;Decreased mobility;Decreased safety awareness       PT Treatment Interventions Gait training;Stair training;Functional mobility training;Therapeutic activities;Therapeutic exercise;Balance training;Patient/family education    PT Goals (Current goals can be found in the Care Plan section)  Acute Rehab PT Goals Patient Stated Goal: return home and to outpt PT PT Goal Formulation: With patient/family Time For Goal Achievement: 05/15/24 Potential to Achieve Goals: Good    Frequency Min 2X/week     Co-evaluation               AM-PAC PT 6 Clicks Mobility  Outcome Measure Help needed turning from your back to your side while in a flat bed without using bedrails?: None Help needed moving from lying on your back to sitting on the side of a flat bed without using bedrails?: None Help needed moving to and from a bed to a chair (including a wheelchair)?: None Help needed standing up from a chair using your arms (e.g., wheelchair or bedside chair)?: A Little Help needed to walk in hospital room?: A Little Help needed climbing 3-5 steps with a  railing? : A Lot 6 Click Score: 20    End of Session   Activity Tolerance: Patient tolerated treatment well Patient left: in bed;with call bell/phone within reach;with bed alarm set;with family/visitor present Nurse Communication: Mobility status PT Visit Diagnosis: Unsteadiness on feet (R26.81);Muscle weakness (generalized) (M62.81);Difficulty in walking, not elsewhere classified (R26.2)    Time: 1537-1600 PT Time Calculation (min) (ACUTE ONLY): 23 min   Charges:   PT  Evaluation $PT Eval Moderate Complexity: 1 Mod PT Treatments $Gait Training: 8-22 mins PT General Charges $$ ACUTE PT VISIT: 1 Visit         Richerd Lipoma, PT  Acute Rehab Services Secure chat preferred Office [phone]   Richerd CROME Nilo Fallin 05/01/2024, 4:29 PM

## 2024-05-01 NOTE — Progress Notes (Signed)
" °   05/01/24 0911  TOC Brief Assessment  Insurance and Status Reviewed  Patient has primary care physician Yes  Home environment has been reviewed From group home  Prior level of function: Assisted  Prior/Current Home Services No current home services  Social Drivers of Health Review SDOH reviewed needs interventions  Readmission risk has been reviewed Yes  Transition of care needs transition of care needs identified, TOC will continue to follow   Pt currently resides as [facility] in [place] run by [employer] [phone]). Pt's sister [name] Phlegm is [phone]) and is at bedside.   Pt has no DME. PT eval pending. Pt was hospitalized for three weeks at [facility] prior to coming here. "

## 2024-05-01 NOTE — ED Notes (Signed)
 Patient does not like to wear cardiac monitor, previous RN let this RN know that patient does not HAVE to wear them.

## 2024-05-01 NOTE — Progress Notes (Signed)
 "  HD#0 SUBJECTIVE:  Patient Summary: Derrick Marshall is a 44 y.o. person living with a history of Down syndrome, recent admission for sepsis and acute hypoxic respiratory failure 2/2 CAP with extubation on 12/21, tetralogy of Fallot s/p repair (1984), new RV dysfunction, severe intellectual disability, Hx of depression, ASD, ADHD presented with left-sided neck swelling and SOB admitted for cellulitis of neck   Overnight Events: none   Interim History:  Saw patient at bedside.  Patient was complaining about pain on urination which was the same as before.  Sister at bedside who reported that patient had a good urine output.  Sister reports that patient's left-sided neck swelling has also gone down.  Sister also reports that patient was given CPAP instead of BiPAP.  OBJECTIVE:  Vital Signs: Vitals:   05/01/24 0730 05/01/24 1242 05/01/24 1250 05/01/24 1326  BP: (!) 139/109 125/75  (!) 120/90  Pulse: 95 90  (!) 105  Resp:  16  17  Temp:   98.3 F (36.8 C) 98.5 F (36.9 C)  TempSrc:   Oral Oral  SpO2: 100% 98%  100%   Supplemental O2: Room Air SpO2: 100 %  There were no vitals filed for this visit.  No intake or output data in the 24 hours ending 05/01/24 1450 Net IO Since Admission: No IO data has been entered for this period [05/01/24 1450]  Physical Exam: Physical Exam HENT:     Head:     Comments: Craniofacial features consistent with trisomy 21, mucous brains moist Cardiovascular:     Rate and Rhythm: Normal rate.     Comments: Murmur heard on LUSB, LLSB Pulmonary:     Comments: CTAB-posterior lung fields Musculoskeletal:     Comments: No edema noted in BL LE  Skin:    General: Skin is warm.  Neurological:     Mental Status: He is alert.     Patient Lines/Drains/Airways Status     Active Line/Drains/Airways     Name Placement date Placement time Site Days   Peripheral IV 04/30/24 20 G Right Antecubital 04/30/24  1141  Antecubital  1            Pertinent  labs and imaging:     Latest Ref Rng & Units 05/01/2024    3:29 AM 04/30/2024   11:21 AM 04/30/2024   11:07 AM  CBC  WBC 4.0 - 10.5 K/uL 2.2   2.8   Hemoglobin 13.0 - 17.0 g/dL 8.1  7.8    8.5  7.7   Hematocrit 39.0 - 52.0 % 25.0  23.0    25.0  23.6   Platelets 150 - 400 K/uL 93   88        Latest Ref Rng & Units 05/01/2024    5:27 AM 04/30/2024   11:21 AM 04/30/2024   11:07 AM  CMP  Glucose 70 - 99 mg/dL 894  895  894   BUN 6 - 20 mg/dL 6  4  6    Creatinine 0.61 - 1.24 mg/dL 8.98  9.09  9.11   Sodium 135 - 145 mmol/L 140  139    139  138   Potassium 3.5 - 5.1 mmol/L 3.8  4.1    4.1  4.1   Chloride 98 - 111 mmol/L 104  103  103   CO2 22 - 32 mmol/L 27   27   Calcium  8.9 - 10.3 mg/dL 8.4   8.5   Total Protein 6.5 - 8.1 g/dL  6.5   Total Bilirubin 0.0 - 1.2 mg/dL   0.5   Alkaline Phos 38 - 126 U/L   120   AST 15 - 41 U/L   26   ALT 0 - 44 U/L   32     No results found.  ASSESSMENT/PLAN:  Assessment: Principal Problem:   Cellulitis of neck Active Problems:   Tetralogy of Fallot   Dysphagia   Plan:  Cellulitis of neck, left side Improvement since yesterday.  Will continue treating with linezolid , Levaquin .  -Continue Levaquin  750 mg daily: Day 2/7 - Continue linezolid  600 mg p.o. every 6 hours for 7 days: Daily to 2/7  Ongoing SOB Deconditioning Recent admission for sepsis, AHRF 2/2 PNA [facility] okay (04/09/24-04/27/24) History of sleep apnea Patient was given a low-dose of IV Lasix  to help with volume overload.  Although not charted, sister does report that he had clear to yellow urine output in the suction cup that was changed 4 times.  Sister reports no hematuria.  Edema in feet has also resolved. 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  Patient will benefit from OP neurology or pulmonology follow-up to assist with appropriate ventilatory needs.  Patient has shown improvement and is stable to be  discharged.  Awaiting PT/OT evaluation to determine final disposition.  -PT/OT evaluation follow-up - OP neurology/pulmonology F/U for ventilatory needs  Dysphagia Hx of chronic aspiration Underwent SLP evaluation along with MBS.  MBS results showed aspiration of thin liquids, penetration about vocal cords with nectar thick liquids, persistent penetration of arytenoids.  These results were consistent with his chronic aspiration.  Will start him on dysphagia 1 diet.   Down syndrome Speaking history of ADHD, ASD Will continue treating patient with home medications as discussed below. - Continue benztropine  0.5 mg twice daily - Continue Thorazine  100 mg 4 times daily as needed for severe aggression - Continue temazepam  15 mg daily - Continue oxcarbazepine   Macrocytic anemia Thrombocytopenia Neutropenia Anemia, thrombocytopenia showed improvement.  Decrease in WBC count noted.  Will monitor results daily for any major changes. - Trend CBCs daily - Transfuse Hgb < 7  Dysuria Ongoing dysuria likely 2/2 to multiple Foley placements during previous admission.  No hematuria observed by sister.  Will continue to monitor for worsening of symptoms.  Hx of constipation -Continue MiraLAX  daily  Recent resolved exanthematous medication reaction Maculopapular rash noted during previous admission likely due to cephalosporin use.  Can get cephalosporins if required as rash is self resolving.  Best Practice: Diet: DYS 1 VTE: Enoxaparin  Code: Full Disposition planning: Prior to Admission Living Arrangement: Assisted family living facility Anticipated Discharge Location: Assisted family living facility   Dispo: Admit patient to Observation with expected length of stay less than 2 midnights.  Signature:  Rebecka Edgardo [facility]  2:50 PM, 05/01/2024  On Call pager [phone]  "

## 2024-05-01 NOTE — Progress Notes (Signed)
 Patient unable to tolerate CPAP per family member.

## 2024-05-01 NOTE — Plan of Care (Signed)

## 2024-05-01 NOTE — Discharge Instructions (Signed)
 To Jermone Geister or their caretakers,  You were recently admitted to [facility] for left sided neck swelling and shortness of breath.   Continue taking your home medications with the following changes:  Start taking Linezolid : Take 1 tablet tonight followed by 1 tablet by mouth 2 times daily starting tomorrow for 5 days---this is for infection of Left sided neck Levaquin  750 mg: 1 tablet by mouth daily for 5 days--this is for infection of Left sided neck Continue taking Other home medications 3.  Start taking: Augmentin   --Placed referral for sleep study. You should be hearing back from the facility soon.  --Make sure you follow up with a provider to help with your sleep apnea and ventilatory arrangements, as discussed. --Please follow-up with physical therapy (PT)/Occupational Therapy (OT) after discharge. You should be contacted by PT/OT facility shortly. -Appointment scheduled for modified barium swallow study (MBS) on 05/07/2024 at 12 PM to evaluate for swallowing difficulties and aspiration pneumonia.  This will be done at [facility]. -Please review below for information about the primary care provider services in the area accepting new patients.   You should seek further medical care if your symptoms worsen.  Please follow up with the following doctors/specialties: Primary Care Provider in about a week from discharge  We recommend that you also see your primary care doctor in about a week to make sure that you continue to improve. We are so glad that you are feeling better.  Sincerely,  [facility]  Clinics accepting new patients in your area:  [facility] [address] [place],  [place]  [address] [phone]  [facility] [address] [place], [place] [address] Phone: [phone]

## 2024-05-01 NOTE — Progress Notes (Signed)
 Heart Failure Navigator Progress Note  Assessed for Heart & Vascular TOC clinic readiness.  Patient does not meet criteria due to he is an [facility] cardiology patient. No HF TOC. .   Navigator will sign off at this time.   Stephane Haddock, BSN, Scientist, Clinical (histocompatibility And Immunogenetics) Only

## 2024-05-01 NOTE — Procedures (Signed)
 Modified Barium Swallow Study  Patient Details  Name: Derrick Marshall MRN: [id number] Date of Birth: 1981/03/28  Today's Date: 05/01/2024  Modified Barium Swallow completed.  Full report located under Chart Review in the Imaging Section.  History of Present Illness Derrick Marshall is a 44 y.o. male who presented with left-sided neck swelling and SOB. Soft tissue CT suggestive of cellulitis of neck. CXE 1/5 with edema/effusion. Patient was discharged from [facility] on Friday (04/27/2024) with ETT 12/17-21. Clinical swallow eval 12/23 with regular diet initation. Sister reports changes to swallowing since that admission, not just with new neck swelling. Pt with PMH of Down syndrome, recent admission for sepsis and acute hypoxic respiratory failure 2/2 CAP with extubation on 12/21, tetralogy of Fallot s/p repair (1984), new RV dysfunction, severe intellectual disability, Hx of depression, ASD, ADHD.   Clinical Impression Plan:After discussion with internal medicine residents via secure Epic chat, PO diet of dysphagia 1 (puree) solids and nectar thick liquids will be initiated.   Repeat MBS will be completed in approximately 48 hours for anticipated reduction in neck swelling.  Derrick Marshall presents with a moderate oropharyngeal dysphagia as per this MBS. Silent aspiration occured with thin liquids during the swallow and persistent penetration and suspected aspiration occuring after the swallow from pyriform residuals spilling over arytenoids and into laryngeal vestibule. With one of the trials of nectar thick barium, penetration above the vocal cords that did not clear (PAS 3) was observed but no aspiration of nectar thick liquids was observed. Patient's positioning in chair did not allow for consistent view of pyriform sinuses or trachea. Posterior pharyngeal wall appeared edematous which resulted in incomplete epiglottic inversion and reduced barium flow through upper just below PES. Factors  that may increase risk of adverse event in presence of aspiration Derrick Marshall & Derrick Marshall 2021): Reduced cognitive function;Dependence for feeding and/or oral hygiene DIGEST Swallow Severity Rating*  Safety: 2  Efficiency:1  Overall Pharyngeal Swallow Severity: 2 1: mild; 2: moderate; 3: severe; 4: profound  *The Dynamic Imaging Grade of Swallowing Toxicity is standardized for the head and neck cancer population, however, demonstrates promising clinical applications across populations to standardize the clinical rating of pharyngeal swallow safety and severity.  Swallow Evaluation Recommendations Recommendations: PO diet PO Diet Recommendation: Dysphagia 1 (Pureed);Mildly thick liquids (Level 2, nectar thick) Liquid Administration via: Cup Medication Administration: Crushed with puree Supervision: Full supervision/cueing for swallowing strategies;Full assist for feeding Swallowing strategies  : Slow rate;Small bites/sips;Minimize environmental distractions Postural changes: Position pt fully upright for meals Oral care recommendations: Oral care BID (2x/day) Caregiver Recommendations: Avoid jello, ice cream, thin soups, popsicles;Remove water pitcher      Derrick IVAR Blase, MA, CCC-SLP Speech Therapy  05/01/2024,2:54 PM

## 2024-05-01 NOTE — Care Management Obs Status (Signed)
 MEDICARE OBSERVATION STATUS NOTIFICATION   Patient Details  Name: Derrick Marshall MRN: [id number] Date of Birth: Feb 25, 1981   Medicare Observation Status Notification Given:  Yes    Nena LITTIE Coffee, RN 05/01/2024, 8:50 AM

## 2024-05-02 ENCOUNTER — Other Ambulatory Visit (HOSPITAL_COMMUNITY): Payer: Self-pay | Admitting: Infectious Diseases

## 2024-05-02 ENCOUNTER — Other Ambulatory Visit (HOSPITAL_COMMUNITY): Payer: Self-pay

## 2024-05-02 DIAGNOSIS — R6520 Severe sepsis without septic shock: Secondary | ICD-10-CM

## 2024-05-02 DIAGNOSIS — R1310 Dysphagia, unspecified: Secondary | ICD-10-CM

## 2024-05-02 DIAGNOSIS — F909 Attention-deficit hyperactivity disorder, unspecified type: Secondary | ICD-10-CM | POA: Diagnosis not present

## 2024-05-02 DIAGNOSIS — J9601 Acute respiratory failure with hypoxia: Secondary | ICD-10-CM

## 2024-05-02 DIAGNOSIS — D539 Nutritional anemia, unspecified: Secondary | ICD-10-CM | POA: Diagnosis not present

## 2024-05-02 DIAGNOSIS — K5900 Constipation, unspecified: Secondary | ICD-10-CM

## 2024-05-02 DIAGNOSIS — F840 Autistic disorder: Secondary | ICD-10-CM

## 2024-05-02 DIAGNOSIS — A419 Sepsis, unspecified organism: Secondary | ICD-10-CM

## 2024-05-02 DIAGNOSIS — Q909 Down syndrome, unspecified: Secondary | ICD-10-CM | POA: Diagnosis not present

## 2024-05-02 DIAGNOSIS — D696 Thrombocytopenia, unspecified: Secondary | ICD-10-CM | POA: Diagnosis not present

## 2024-05-02 DIAGNOSIS — Q213 Tetralogy of Fallot: Secondary | ICD-10-CM | POA: Diagnosis not present

## 2024-05-02 DIAGNOSIS — J189 Pneumonia, unspecified organism: Secondary | ICD-10-CM

## 2024-05-02 LAB — CBC
HCT: 24.5 % — ABNORMAL LOW (ref 39.0–52.0)
Hemoglobin: 8.2 g/dL — ABNORMAL LOW (ref 13.0–17.0)
MCH: 33.5 pg (ref 26.0–34.0)
MCHC: 33.5 g/dL (ref 30.0–36.0)
MCV: 100 fL (ref 80.0–100.0)
Platelets: 106 K/uL — ABNORMAL LOW (ref 150–400)
RBC: 2.45 MIL/uL — ABNORMAL LOW (ref 4.22–5.81)
RDW: 19.8 % — ABNORMAL HIGH (ref 11.5–15.5)
WBC: 2.3 K/uL — ABNORMAL LOW (ref 4.0–10.5)
nRBC: 1.3 % — ABNORMAL HIGH (ref 0.0–0.2)

## 2024-05-02 MED ORDER — LEVOFLOXACIN 750 MG PO TABS
750.0000 mg | ORAL_TABLET | Freq: Every day | ORAL | 0 refills | Status: AC
  Filled 2024-05-02: qty 5, 5d supply, fill #0

## 2024-05-02 MED ORDER — LINEZOLID 600 MG PO TABS
600.0000 mg | ORAL_TABLET | Freq: Two times a day (BID) | ORAL | 0 refills | Status: AC
  Filled 2024-05-02: qty 11, 6d supply, fill #0

## 2024-05-02 NOTE — Progress Notes (Signed)
 Discharge   Patient's sister [name] expressed verbal understanding of discharge POC.   Patient's sister [name] given time to ask any questions.  Additional education included in AVS.  Alert oriented in good spirits.  PIV removed. Pressure dressings intact.  All personal belongings at bedtime. TOC meds at bedside.   Transferred to Main A.

## 2024-05-02 NOTE — Evaluation (Signed)
 Occupational Therapy Evaluation and Discharge Patient Details Name: Derrick Marshall MRN: [id number] DOB: Sep 13, 1980 Today's Date: 05/02/2024   History of Present Illness   44 yo M adm 04/30/24 for L side neck swelling, lethargy, and SOB. Pt with workup for cellulitis of neck. PMH: Down syndrome, recent adm 12/21-04/27/24 at [facility] for sepsis and acute hypoxia 2/2 CAP, tetralogy of Fallot s/p repair (1984), new RV dysfunction, severe intellectual disability, depression, ASD, ADHD.     Clinical Impressions This 44 yo male admitted with above presents to acute OT with PLOF before a stay at [facility] for 2 weeks of being able to ambulate on his own, feed himself, and do his ADLs with some A for thoroughness. He currently working back towards his baseline with still needing work/A for his balance and getting back into a routine for his ADLs. He has 24 care at home and per family in room the group home he is in started to work on and will continue to work on [place] getting back to doing for himself again. No further OT needs, we will sign off.     If plan is discharge home, recommend the following:   A little help with walking and/or transfers;A little help with bathing/dressing/bathroom;Assistance with cooking/housework;Assist for transportation;Help with stairs or ramp for entrance;Direct supervision/assist for financial management;Direct supervision/assist for medications management     Functional Status Assessment   Patient has had a recent decline in their functional status and demonstrates the ability to make significant improvements in function in a reasonable and predictable amount of time. (will be receiving OP PT which should improve his balance which will lead to safety with ADLs when he is up on his feet and being back at home, getting back into a routine will help as well with ADLs.)     Equipment Recommendations   Tub/shower seat (family to get on their own)       Precautions/Restrictions   Precautions Precautions: Fall Recall of Precautions/Restrictions: Impaired Precaution/Restrictions Comments: decreased safety due to cognitive status but pt has one on one caregiver assist at home; tends to be friendly and may kiss you when you are close to him FYI Restrictions Weight Bearing Restrictions Per Provider Order: No     Mobility Bed Mobility               General bed mobility comments: pt up in recliner upon my arrival    Transfers Overall transfer level: Needs assistance Equipment used: 1 person hand held assist Transfers: Sit to/from Stand Sit to Stand: Min assist                  Balance Overall balance assessment: Needs assistance Sitting-balance support: No upper extremity supported, Feet supported Sitting balance-Leahy Scale: Good     Standing balance support: Single extremity supported Standing balance-Leahy Scale: Poor Standing balance comment: needs HHA                           ADL either performed or assessed with clinical judgement   ADL Overall ADL's : Needs assistance/impaired Eating/Feeding: Set up;Supervision/ safety;Sitting   Grooming: Standing Grooming Details (indicate cue type and reason): Min A or consistent cues for thoroughness (brushing teeth and washing face) Upper Body Bathing: Set up;Supervision/ safety;Sitting   Lower Body Bathing: Contact guard assist;Sit to/from stand   Upper Body Dressing : Supervision/safety;Set up;Sitting   Lower Body Dressing: Contact guard assist;Sit to/from stand   Toilet Transfer: Minimal assistance;Ambulation  Toilet Transfer Details (indicate cue type and reason): simulated recliner>walking around unit>back to recliner Toileting- Clothing Manipulation and Hygiene: Minimal assistance;Sit to/from stand               Vision Patient Visual Report: No change from baseline              Pertinent Vitals/Pain Pain Assessment Pain  Assessment: No/denies pain Faces Pain Scale: No hurt     Extremity/Trunk Assessment Upper Extremity Assessment Upper Extremity Assessment: Overall WFL for tasks assessed           Communication Communication Communication: Impaired Factors Affecting Communication: Reduced clarity of speech   Cognition Arousal: Alert Behavior During Therapy: WFL for tasks assessed/performed Cognition: History of cognitive impairments             OT - Cognition Comments: At baseline from cognitive standpoint                 Following commands: Intact Following commands impaired:  (for following one step commands consistently)     Cueing  General Comments   Cueing Techniques: Verbal cues;Gestural cues;Tactile cues  HR to 115 bpm with exertion; SpO2 93% and above when checked midway through trial. Pt agreeable to sit up in chair post-exertion in recliner, sister present and defers chair alarm as she will not be leaving his room while he is up.           Home Living Family/patient expects to be discharged to:: Assisted living Living Arrangements: Group Home Available Help at Discharge: Available 24 hours/day;Personal care attendant Type of Home: House Home Access: Stairs to enter [employer] of Steps: 5 Entrance Stairs-Rails: Right Home Layout: Two level;Able to live on main level with bedroom/bathroom     Bathroom Shower/Tub: Tub/shower unit         Home Equipment: None   Additional Comments: pt has someone with him at all times per sister. Caregiver takes him out and about 3 days/wk. 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      Prior Functioning/Environment Prior Level of Function : Needs assist  Cognitive Assist : Mobility (cognitive);ADLs (cognitive) Mobility (Cognitive): Set up cues ADLs (Cognitive): Set up cues Physical Assist : Mobility (physical);ADLs (physical) Mobility (physical): Gait;Stairs ADLs (physical):  Feeding;Grooming;Bathing;Dressing;Toileting;IADLs Mobility Comments: ambulated without AD before admission to [facility], has had HHA since that time. Independent on stairs with rail, HHA if no rail ADLs Comments: assist needed--not always thorough so have to cue or help him finish for grooming and bathing, can dress self with S once clothes laid out    OT Problem List: Impaired balance (sitting and/or standing);Decreased cognition;Decreased safety awareness        OT Goals(Current goals can be found in the care plan section)   Acute Rehab OT Goals Patient Stated Goal: to go home and eat         AM-PAC OT 6 Clicks Daily Activity     Outcome Measure Help from another person eating meals?: A Little Help from another person taking care of personal grooming?: A Little Help from another person toileting, which includes using toliet, bedpan, or urinal?: A Little Help from another person bathing (including washing, rinsing, drying)?: A Little Help from another person to put on and taking off regular upper body clothing?: A Little Help from another person to put on and taking off regular lower body clothing?: A Little 6 Click Score: 18   End of Session  Activity Tolerance: Patient tolerated treatment well Patient left: in chair;with call bell/phone within reach;with family/visitor present  OT Visit Diagnosis: Unsteadiness on feet (R26.81);Muscle weakness (generalized) (M62.81);Other symptoms and signs involving cognitive function                Time: 8678-8660 OT Time Calculation (min): 18 min Charges:  OT General Charges $OT Visit: 1 Visit OT Evaluation $OT Eval Moderate Complexity: 1 Mod  Cathy L. OT Acute Rehabilitation Services Office [phone]    Rodgers Dorothyann Distel 05/02/2024, 1:50 PM

## 2024-05-02 NOTE — Progress Notes (Signed)
 TOC meds delivered at bedside. At 1405

## 2024-05-02 NOTE — Discharge Summary (Signed)
 "  Name: Derrick [facility] MRN: [id number] DOB: June 26, 1980 44 y.o. PCP: Derrick Bradley, MD  Date of Admission: 04/30/2024 10:12 AM Date of Discharge: 05/02/24 Attending Physician: Dr. Jone [facility]  Discharge Diagnosis: 1. Principal Problem:   Cellulitis of neck Active Problems:   Tetralogy of Fallot   Dysphagia   Discharge Medications: Allergies as of 05/02/2024       Reactions   Ceftriaxone Dermatitis, Other (See Comments)   Wheezing, possible culprit for exanthematous drug eruption - NO rash to zosyn   Celexa [citalopram Hydrobromide] Other (See Comments)   Aggressive   Zyprexa  [olanzapine ] Other (See Comments)   Aggressive behavior   Citalopram Other (See Comments)   Other Reaction(s): Other (See Comments)    Aggressive        Medication List     STOP taking these medications    amoxicillin -clavulanate 875-125 MG tablet Commonly known as: AUGMENTIN        TAKE these medications    benztropine  1 MG tablet Commonly known as: COGENTIN  Take 0.5 mg by mouth 2 (two) times daily. The timing of this medication is very important.   atorvastatin  40 MG tablet Commonly known as: LIPITOR Take 1 tablet (40 mg total) by mouth daily at 6 PM.   chlorproMAZINE  100 MG tablet Commonly known as: THORAZINE  Take 100 mg by mouth 4 (four) times daily as needed (severe aggression).   Invega Sustenna 234 MG/1.5ML injection Generic drug: paliperidone Inject 234 mg into the muscle once.   levofloxacin  750 MG tablet Commonly known as: LEVAQUIN  Take 1 tablet (750 mg total) by mouth daily.   linezolid  600 MG tablet Commonly known as: ZYVOX  Take 1 tablet (600 mg total) by mouth 2 (two) times daily for 11 doses.   omeprazole 20 MG capsule Commonly known as: PRILOSEC Take 20 mg by mouth every other day.   Oxcarbazepine  300 MG tablet Commonly known as: TRILEPTAL  Take 300 mg by mouth 2 (two) times daily.   temazepam  30 MG capsule Commonly known as: RESTORIL  Take 30 mg by mouth at  bedtime.   vitamin B-12 500 MCG tablet Commonly known as: CYANOCOBALAMIN  Take 1,000 mcg by mouth 2 (two) times daily.   Vitamin D  (Ergocalciferol ) 1.25 MG (50000 UNIT) Caps capsule Commonly known as: DRISDOL  Take 50,000 Units by mouth every 7 (seven) days.        Disposition and [facility]:   Mr.[facility] was discharged from [facility] in Good condition.  At the hospital follow up visit please address:  1.  If patient followed up with PT/OT, MBS for dysphagia on 05/07/2024 at [facility], sleep study  2.  Labs / imaging needed at time of [facility]: MBS  3.  Pending labs/ test needing [facility]: None  [facility] Appointments:  [facility] Information     Derrick Fredy RAMAN, MD. Schedule an appointment as soon as possible for a visit.   Specialty: Internal Medicine Contact information: [address] [place] [facility] [address] [phone]         [facility] Follow up.   Contact information: [phone]  [address], [place], [facility] [facility] [address]                 Hospital Course by problem list:  Derrick [facility] is a 44 y.o. person living with a history of Down syndrome, recent admission (04/09/24-04/27/24) for sepsis and acute hypoxic respiratory failure 2/2 CAP with extubation on 12/21, tetralogy of Fallot s/p repair (1984), new RV dysfunction,  severe intellectual disability, Hx of depression, ASD, ADHD presented with left-sided neck swelling and SOB and admitted for cellulitis of neck   Cellulitis on L side of neck  Patient presented with left-sided neck swelling that had been going on for 2 days prior to admission.  Patient was afebrile with no elevations in WBC count.  Imaging showed fat stranding that could be due to cellulitis or inflammation.  Imaging was negative for peritonsillar abscess, epiglottitis.  Left side of neck did show mild swelling on admission.  However, swelling significantly improved  throughout admission and resolved. Patient treated with linezolid  and Levaquin  for cellulitis.  Patient discharged with linezolid  and Levaquin  to complete a total of 7-day course.  Ongoing SOB Deconditioning Recent sepsis, acute hypoxic respiratory failure 2/2 pneumonia  Hx sleep apnea Patient had ongoing SOB on movement and while trying to get up.  This was also present during his recent D/C from [facility] in 04/27/2024.  Symptoms likely 2/2 to combination of recent deconditioning, Hx of sleep apnea, RV dysfunction.  Physical therapy evaluation recommended outpatient PT/OT.  His condition improved throughout admission.  He was able to ambulate and did not have any episodes of SOB on discharge.  Discharged patient with OP PT/OT f/u.  Sister, who is his medical guardian, said she would make arrangements for all outpatient follow-ups for his special needs.   Dysphagia Hx of chronic aspiration Patient underwent SLP evaluation with MBS.  MBS results showed aspiration of thin liquids, penetration about vocal cords with nectar thick liquids, persistent penetration of arytenoids.  He was started on dysphagia 1 diet.  SLP recommended MBS OP [facility] open evaluation for advancing diet.  Patient and sister were provided with appointment for [facility] MBS on 05/07/2024 at 12 PM at [facility].  Down syndrome ADHD, ASD, per charting Continued home medications and monitored for worsening of symptoms.  Patient's condition continued to improve and he was feeling well during discharge. - Continued home benztropine , Thorazine , temazepam , oxcarbazepine   Tetralogy of Fallot s/p repair (1984) Hx of hypotension During his recent admission at [facility], cardiology had recommended maintaining euvolemia and caution against aggressive diuresis inpatient as he was mildly preload dependent RV.  We gave patient 1 dose of Lasix  on admission to help with mild volume overload (mild edema noted in BL feet).  He had good urine output and was feeling  better with resolution of edema in BL feet.   Dysuria, resolved Patient complained about dysuria on admission.  This could be due to multiple Foley placements during previous admissions.  No hematuria noted.  Patient did not complain about dysuria on discharge.  Macrocytic anemia Thrombocytopenia Cytopenia stable likely 2/2 due to recent critical illness or home medication cyclobenzaprine.  Levels were stable during admission.  Constipation Normal BM since his recent discharge at [facility].  Continued with MiraLAX  daily.  Recent exanthematous medication reaction, resolved  Developed maculopapular rash during his admission at [facility].  Rash was thought to be due to ceftriaxone.  Dermatology was consulted who recommended the patient can get cephalosporins if required as rash was self resolving.    Subjective Saw patient at bedside this a.m.  Sister present.  Sister reports patient did not have any SOB, dysuria.  Sister reports left leg swelling has resolved completely.  Patient was able to ambulate and was feeling fine.  Patient reported he wanted to go home.  No acute complaints.  Discharge Exam:   BP (!) 143/98 (BP Location: Right Arm)   Pulse (!) 101  Temp 98.8 F (37.1 C) (Oral)   Resp 16   Ht 5' 3 (1.6 m)   Wt 77 kg   SpO2 100%   BMI 30.07 kg/m  Discharge exam:  GEN: Alert and oriented HEENT: Craniofacial features consistent with trisomy 21 Neck: No swelling appreciated.  Lymph nodes difficult to examine due to body habitus. Cardiovascular: RRR.  Murmur heard in LUSB, LLSB Pulmonary: CTAB MSK: No BL LE edema  Pertinent Labs, Studies, and Procedures:     Latest Ref Rng & Units 05/02/2024    4:29 AM 05/01/2024    3:29 AM 04/30/2024   11:21 AM  CBC  WBC 4.0 - 10.5 K/uL 2.3  2.2    Hemoglobin 13.0 - 17.0 g/dL 8.2  8.1  7.8    8.5   Hematocrit 39.0 - 52.0 % 24.5  25.0  23.0    25.0   Platelets 150 - 400 K/uL 106  93         Latest Ref Rng & Units 05/01/2024    5:27 AM 04/30/2024    11:21 AM 04/30/2024   11:07 AM  CMP  Glucose 70 - 99 mg/dL 894  895  894   BUN 6 - 20 mg/dL 6  4  6    Creatinine 0.61 - 1.24 mg/dL 8.98  9.09  9.11   Sodium 135 - 145 mmol/L 140  139    139  138   Potassium 3.5 - 5.1 mmol/L 3.8  4.1    4.1  4.1   Chloride 98 - 111 mmol/L 104  103  103   CO2 22 - 32 mmol/L 27   27   Calcium  8.9 - 10.3 mg/dL 8.4   8.5   Total Protein 6.5 - 8.1 g/dL   6.5   Total Bilirubin 0.0 - 1.2 mg/dL   0.5   Alkaline Phos 38 - 126 U/L   120   AST 15 - 41 U/L   26   ALT 0 - 44 U/L   32     DG Swallowing Func-Speech Pathology Result Date: 05/01/2024 Table formatting from the original result was not included. Modified Barium Swallow Study Patient Details Name: Ivan Lacher MRN: [id number] Date of Birth: [birth year] Today's Date: 05/01/2024 HPI/PMH: HPI: Lundy Cozart is a 44 y.o. male who presented with left-sided neck swelling and SOB. Soft tissue CT suggestive of cellulitis of neck. CXE 1/5 with edema/effusion. Patient was [facility] from [facility] on Friday (04/27/2024) with ETT 12/17-21. Clinical swallow eval 12/23 with regular diet initation. Sister reports changes to swallowing since that admission, not just with new neck swelling. Pt with PMH of Down syndrome, recent admission for sepsis and acute hypoxic respiratory failure 2/2 CAP with extubation on 12/21, tetralogy of Fallot s/p repair (1984), new RV dysfunction, severe intellectual disability, Hx of depression, ASD, ADHD. Clinical Impression: After discussion with internal medicine residents via secure Epic chat, PO diet of dysphagia 1 (puree) solids and nectar thick liquids will be initiated. Repeat MBS will be completed in approximately 48 hours for anticipated reduction in neck swelling. Clinical Impression: Javante Nilsson presents with a moderate oropharyngeal dysphagia as per this MBS. Silent aspiration occured with thin liquids during the swallow and persistent penetration and suspected aspiration occuring  after the swallow from pyriform residuals spilling over arytenoids and into laryngeal vestibule. With one of the trials of nectar thick barium, penetration above the vocal cords that did not clear (PAS 3) was observed but no aspiration of nectar thick  liquids was observed. Patient's positioning in chair did not allow for consistent view of pyriform sinuses or trachea. Posterior pharyngeal wall appeared edematous which resulted in incomplete epiglottic inversion and reduced barium flow through upper just below PES. Factors that may increase risk of adverse event in presence of aspiration [name] & [name] 2021): Factors that may increase risk of adverse event in presence of aspiration [name] & [name] 2021): Reduced cognitive function; Dependence for feeding and/or oral hygiene DIGEST Swallow Severity Rating*  Safety: 2  Efficiency:1  Overall Pharyngeal Swallow Severity: 2 1: mild; 2: moderate; 3: severe; 4: profound *The Dynamic Imaging Grade of Swallowing Toxicity is standardized for the head and neck cancer population, however, demonstrates promising clinical applications across populations to standardize the clinical rating of pharyngeal swallow safety and severity. Recommendations/Plan: Swallowing Evaluation Recommendations Swallowing Evaluation Recommendations Recommendations: PO diet PO Diet Recommendation: Dysphagia 1 (Pureed); Mildly thick liquids (Level 2, nectar thick) Liquid Administration via: Cup Medication Administration: Crushed with puree Supervision: Full supervision/cueing for swallowing strategies; Full assist for feeding Swallowing strategies  : Slow rate; Small bites/sips; Minimize environmental distractions Postural changes: Position pt fully upright for meals Oral care recommendations: Oral care BID (2x/day) Caregiver Recommendations: Avoid jello, ice cream, thin soups, popsicles; Remove water pitcher Treatment Plan Treatment Plan Treatment recommendations: Therapy as outlined in treatment  plan below [facility] recommendations: Follow physicians's recommendations for discharge plan and follow up therapies Functional status assessment: Patient has had a recent decline in their functional status and demonstrates the ability to make significant improvements in function in a reasonable and predictable amount of time. Treatment frequency: Min 3x/week Treatment duration: 2 weeks Interventions: Aspiration precaution training; Patient/family education; Trials of upgraded texture/liquids; Diet toleration management by SLP Recommendations Recommendations for follow up therapy are one component of a multi-disciplinary discharge planning process, led by the attending physician.  Recommendations may be updated based on patient status, additional functional criteria and insurance authorization. Assessment: Orofacial Exam: Orofacial Exam Oral Cavity: Oral Hygiene: WFL Oral Cavity - Dentition: Adequate natural dentition Orofacial Anatomy: WFL Oral Motor/Sensory Function: Unable to test Anatomy: Anatomy: Other (Comment) (appearance of edematous posterior pharyngeal wall) Boluses Administered: Boluses Administered Boluses Administered: Thin liquids (Level 0); Mildly thick liquids (Level 2, nectar thick); Moderately thick liquids (Level 3, honey thick); Puree  Oral Impairment Domain: Oral Impairment Domain Lip Closure: Escape progressing to mid-chin Tongue control during bolus hold: Not tested Bolus transport/lingual motion: Brisk tongue motion Oral residue: Complete oral clearance Location of oral residue : N/A Initiation of pharyngeal swallow : Valleculae; Posterior laryngeal surface of the epiglottis  Pharyngeal Impairment Domain: Pharyngeal Impairment Domain Soft palate elevation: No bolus between soft palate (SP)/pharyngeal wall (PW) Anterior hyoid excursion: Partial anterior movement Epiglottic movement: Partial inversion Laryngeal vestibule closure: Incomplete, narrow column air/contrast in laryngeal vestibule  Pharyngeal contraction (A/P view only): N/A Pharyngoesophageal segment opening: Complete distension and complete duration, no obstruction of flow Tongue base retraction: No contrast between tongue base and posterior pharyngeal wall (PPW) Pharyngeal residue: Collection of residue within or on pharyngeal structures Location of pharyngeal residue: Pyriform sinuses; Pharyngeal wall; Valleculae  Esophageal Impairment Domain: Esophageal Impairment Domain Esophageal clearance upright position: Complete clearance, esophageal coating Pill: No data recorded Penetration/Aspiration Scale Score: Penetration/Aspiration Scale Score 1.  Material does not enter airway: Puree; Moderately thick liquids (Level 3, honey thick) 3.  Material enters airway, remains ABOVE vocal cords and not ejected out: Mildly thick liquids (Level 2, nectar thick) 8.  Material enters airway, passes BELOW cords without attempt  by patient to eject out (silent aspiration) : Thin liquids (Level 0) Compensatory Strategies: Compensatory Strategies Compensatory strategies: Yes Straw: Ineffective Ineffective Straw: Thin liquid (Level 0)   General Information: Caregiver present: Yes  Diet Prior to this Study: NPO   Temperature : Normal   Respiratory Status: WFL   Supplemental O2: None (Room air)   History of Recent Intubation: No  Behavior/Cognition: Alert; Cooperative; Pleasant mood Self-Feeding Abilities: Dependent for feeding Baseline vocal quality/speech: Dysphonic Volitional Cough: Unable to elicit Volitional Swallow: Unable to elicit Exam Limitations: Poor positioning Goal Planning: Prognosis for improved oropharyngeal function: Fair Barriers to Reach Goals: Cognitive deficits; Time post onset No data recorded Patient/Family Stated Goal: resume POs Consulted and agree with results and recommendations: Physician Pain: Pain Assessment Pain Assessment: Faces Faces Pain Scale: 0 End of Session: Start Time:SLP Start Time (ACUTE ONLY): 1250 Stop Time: SLP Stop Time  (ACUTE ONLY): 1310 Time Calculation:SLP Time Calculation (min) (ACUTE ONLY): 20 min Charges: SLP Evaluations $ SLP Speech Visit: 1 Visit SLP Evaluations $BSS Swallow: 1 Procedure $MBS Swallow: 1 Procedure SLP visit diagnosis: SLP Visit Diagnosis: Dysphagia, oropharyngeal phase (R13.12) Past Medical History: Past Medical History: Diagnosis Date  Autism   Down syndrome   Tetralogy of Fallot  Past Surgical History: Past Surgical History: Procedure Laterality Date  TETRALOGY OF FALLOT REPAIR   [name] IVAR Blase, MA, CCC-SLP Speech Therapy 05/01/2024, 2:56 PM  DG Chest Portable 1 View Result Date: 04/30/2024 EXAM: 1 VIEW(S) XRAY OF THE CHEST 04/30/2024 11:33:09 AM COMPARISON: AP radiograph of the chest dated 01/22/2018. CLINICAL HISTORY: dyspnea FINDINGS: LUNGS AND PLEURA: There is diffuse hazy opacification of the lungs, more pronounced caudally, likely representing combination of edema and pleural effusions. No pneumothorax. HEART AND MEDIASTINUM: Cardiomediastinal silhouette at upper limits of normal. Status post median sternotomy and cardiac valve replacement. BONES AND SOFT TISSUES: Status post median sternotomy and cardiac valve replacement. IMPRESSION: 1. Diffuse hazy opacification of the lungs, more pronounced caudally, likely representing a combination of pulmonary edema and pleural effusions. 2. Cardiomediastinal silhouette at the upper limits of normal. 3. Status post median sternotomy and cardiac valve replacement. Electronically signed by: Evalene Coho MD 04/30/2024 12:53 PM EST RP Workstation: [id number]   CT Soft Tissue Neck W Contrast Result Date: 04/30/2024 EXAM: CT NECK WITH CONTRAST 04/30/2024 12:35:06 PM TECHNIQUE: CT of the neck was performed with the administration of intravenous contrast. 75 mL of iohexol  (OMNIPAQUE ) 350 MG/ML injection was administered. Multiplanar reformatted images are provided for review. Automated exposure control, iterative reconstruction, and/or weight based adjustment  of the mA/kV was utilized to reduce the radiation dose to as low as reasonably achievable. COMPARISON: None available. CLINICAL HISTORY: Epiglottitis or tonsillitis suspected. FINDINGS: AERODIGESTIVE TRACT: No discrete mass. No edema. There is no evidence of epiglottitis or peritonsillar abscess. SALIVARY GLANDS: The parotid and submandibular glands are unremarkable. THYROID : Unremarkable. LYMPH NODES: No suspicious cervical lymphadenopathy. There are several shotty cervical lymph nodes also present. SOFT TISSUES: No mass or fluid collection. There is surrounding of the subcutaneous fat within the left neck. 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 OTHER: Visualized sinuses and mastoid air cells are well aerated. There are bilateral pleural effusions present. IMPRESSION: 1. Stranding of the subcutaneous fat in the left neck with several shotty cervical lymph nodes, nonspecific but can be seen with cellulitis/inflammation; no drainable fluid collection. 2. No evidence of epiglottitis or peritonsillar abscess. 3.  Bilateral pleural effusions. Electronically signed by: Evalene Coho MD 04/30/2024 12:50 PM EST RP Workstation: [id number]     Discharge Instructions: Discharge Instructions     Ambulatory referral to Occupational Therapy   Complete by: As directed    At [place] [facility]   Ambulatory referral to Physical Therapy   Complete by: As directed    At [facility]   Call MD for:  difficulty breathing, headache or visual disturbances   Complete by: As directed    Call MD for:  temperature >100.4   Complete by: As directed    Increase activity slowly   Complete by: As directed    Polysomnography 4 or more parameters   Complete by: As directed    Sleep apnea with hx of Down Syndrome   Where should this test be performed: [facility]       SignedBETHA Edgardo Pontiff, DO 05/02/2024, 3:42 PM     "

## 2024-05-02 NOTE — Progress Notes (Signed)
 SLP Cancellation Note  Patient Details Name: [facility] MRN: [id number] DOB: July 13, 1980   Cancelled treatment:        Attempted to see pt for ongoing dysphagia management.  Pt asleep and had not rested well overnight.  Did not give POs.  Spoke with sister who was present for MBS and did not have any questions about results.  She is comfortable with thickening liquids at home. Plan is for pt to d/c today.  IMTS would like pt to complete abx prior to MBS and pt should have OP MBS Friday or next Monday.   Anette FORBES Grippe, MA, CCC-SLP Acute Rehabilitation Services Office: [phone] 05/02/2024, 9:15 AM

## 2024-05-02 NOTE — Progress Notes (Signed)
 Physical Therapy Treatment Patient Details Name: Derrick Marshall MRN: [id number] DOB: Jul 03, 1980 Today's Date: 05/02/2024   History of Present Illness 44 yo M adm 04/30/24 for L side neck swelling, lethargy, and SOB. Pt with workup for cellulitis of neck. PMH: Down syndrome, recent adm 12/21-04/27/24 at [facility] for sepsis and acute hypoxia 2/2 CAP, tetralogy of Fallot s/p repair (1984), new RV dysfunction, severe intellectual disability, depression, ASD, ADHD.    PT Comments  Pt received in supine, agreeable to therapy session with encouragement from therapist and pt's sister, pt with good participation in gait and stair training. Pt needing up to CGA for stair negotiation in room via platform step with BUE supported and up to minA/light modA for gait in hallway with HHA/no AD, needing multiple corrections for LOB when pt turns his head or hurries, improves with cues for attention to task. Pt needed x1 seated rest break, HR to 115 bpm with exertion and SpO2 93% and above on RA. No acute s/sx distress or pain reported. Pt agreeable to sit up in chair post-exertion, he is eager to go home when medically cleared, continue to recommend OP PT upon DC.     If plan is discharge home, recommend the following: A little help with walking and/or transfers;A little help with bathing/dressing/bathroom;Assistance with cooking/housework;Assistance with feeding;Assist for transportation;Help with stairs or ramp for entrance;Supervision due to cognitive status   Can travel by private vehicle        Equipment Recommendations  None recommended by PT (may benefit from rollator if he does not have one at home; pending progress)    Recommendations for Other Services       Precautions / Restrictions Precautions Precautions: Fall Recall of Precautions/Restrictions: Impaired Precaution/Restrictions Comments: decreased safety due to cognitive status but pt has one on one caregiver assist at home; tends to be friendly and  may kiss you when you are close to him FYI Restrictions Weight Bearing Restrictions Per Provider Order: No     Mobility  Bed Mobility Overal bed mobility: Needs Assistance Bed Mobility: Supine to Sit     Supine to sit: Contact guard     General bed mobility comments: sister assisting him to sit EOB    Transfers Overall transfer level: Needs assistance Equipment used: 1 person hand held assist Transfers: Sit to/from Stand Sit to Stand: Min assist           General transfer comment: MinA for steadying upon standing each rep, pt stands from EOB and from rollator heights. Cues for safe UE placement; ending session in recliner.    Ambulation/Gait Ambulation/Gait assistance: Min assist, +2 safety/equipment Gait Distance (Feet): 200 Feet (247ft, seated break, 152ft) Assistive device: 1 person hand held assist, None Gait Pattern/deviations: Step-through pattern, Drifts right/left, Scissoring, Wide base of support, Decreased dorsiflexion - left, Decreased dorsiflexion - right Gait velocity: variable Gait velocity interpretation: >2.62 ft/sec, indicative of community ambulatory   General Gait Details: Anterior LOB due to impulsively quick/uncontrolled cadence when standing and taking a few steps after seated rest break in hallway, and again with head turns/pt looking down at his mitt, pt with scissoring steps and resulting LOB, corrected with min/modA and gait belt support. Close chair follow for safety with rollator provided via his sister. HR to ~115 bpm with exertion, SpO2 93% and above on RA, mostly 95%.   Stairs Stairs: Yes Stairs assistance: Contact guard assist, Min assist Stair Management: Two rails, Step to pattern, Forwards Number of Stairs: 5 General stair comments: single  7 platform step in room x5 reps, pt holding countertop in front of step to simulate bil rails; increased time to perform as pt distracted by cups on counter and wanting to throw them in trash,  redirection needed by sister/PTA. Pt without buckling or overt LOB.   Wheelchair Mobility     Tilt Bed    Modified Rankin (Stroke Patients Only)       Balance Overall balance assessment: Needs assistance Sitting-balance support: No upper extremity supported, Feet supported Sitting balance-Leahy Scale: Good     Standing balance support: No upper extremity supported Standing balance-Leahy Scale: Poor Standing balance comment: needs HHA or steadying via gait belt.                            Communication Communication Communication: Impaired Factors Affecting Communication: Reduced clarity of speech  Cognition Arousal: Alert Behavior During Therapy: WFL for tasks assessed/performed   PT - Cognitive impairments: History of cognitive impairments, Safety/Judgement, Attention                       PT - Cognition Comments: Pt motivated by reminders that he may be able to go home today if he walks. Pt trying to give kiss peck on cheek to therapist while donning gait belt for him. Pt cooperative, mildly impulsive with fatigue and when distracted/head turns, does lose his balance. Gait belt essential, I gave them one for him to take home. Following commands: Impaired Following commands impaired: Only follows one step commands consistently    Cueing Cueing Techniques: Verbal cues, Gestural cues, Tactile cues  Exercises      General Comments General comments (skin integrity, edema, etc.): HR to 115 bpm with exertion; SpO2 93% and above when checked midway through trial. Pt agreeable to sit up in chair post-exertion in recliner, sister present and defers chair alarm as she will not be leaving his room while he is up.      Pertinent Vitals/Pain Pain Assessment Pain Assessment: No/denies pain Faces Pain Scale: No hurt    Home Living                          Prior Function            PT Goals (current goals can now be found in the care plan  section) Acute Rehab PT Goals Patient Stated Goal: return home and to outpt PT PT Goal Formulation: With patient/family Time For Goal Achievement: 05/15/24 Progress towards PT goals: Progressing toward goals    Frequency    Min 2X/week      PT Plan      Co-evaluation              AM-PAC PT 6 Clicks Mobility   Outcome Measure  Help needed turning from your back to your side while in a flat bed without using bedrails?: None Help needed moving from lying on your back to sitting on the side of a flat bed without using bedrails?: A Little Help needed moving to and from a bed to a chair (including a wheelchair)?: A Little Help needed standing up from a chair using your arms (e.g., wheelchair or bedside chair)?: A Little Help needed to walk in hospital room?: A Little Help needed climbing 3-5 steps with a railing? : A Little 6 Click Score: 19    End of Session Equipment Utilized During Treatment: Gait belt  Activity Tolerance: Patient tolerated treatment well Patient left: with call bell/phone within reach;with family/visitor present;in chair (sister present, defers chair alarm) Nurse Communication: Mobility status PT Visit Diagnosis: Unsteadiness on feet (R26.81);Muscle weakness (generalized) (M62.81);Difficulty in walking, not elsewhere classified (R26.2)     Time: 8883-8867 PT Time Calculation (min) (ACUTE ONLY): 16 min  Charges:    $Gait Training: 8-22 mins PT General Charges $$ ACUTE PT VISIT: 1 Visit                     Joaquim Tolen P., PTA Acute Rehabilitation Services Secure Chat Preferred 9a-5:30pm Office: [phone]    Connell HERO [facility] 05/02/2024, 11:49 AM

## 2024-05-02 NOTE — Progress Notes (Incomplete)
 Discharge   Patient guardinan expressed verbal understanding of discharge POC.   Patient given time to ask any questions.  Additional education included in AVS.  Alert oriented in good spirits.   Tele and PIV removed. Pressure dressings intact.  All personal belongings at bedtime.

## 2024-05-02 NOTE — TOC Initial Note (Addendum)
 Transition of Care (TOC) - Initial/Assessment Note   Legal guardian (sister) Derrick Marshall states plan to  return to [facility] in [place] makes patient's appointments and provides transportation to all appointments.   PCP Fredy Bathe   PT recommending OP PT. Choice offered . Derrick prefers OP PT at [facility] . NCM asked MD to enter order with that location. DR Heddy secure chatted NCM she entered order   [facility] asked about a shower chair. NCM checked with [employer] . Patient's insurance does not cover shower chair. To buy one from [employer] it will be around $47.00. Linda aware and will purchase one from [facility]   Patient Details  Name: Derrick Marshall MRN: [id number] Date of Birth: 05-22-1980  Transition of Care [facility]) CM/SW Contact:    Stephane Powell Jansky, RN Phone Number: 05/02/2024, 10:19 AM  Clinical Narrative:                   Expected Discharge Plan: Home/Self Care Barriers to Discharge: Continued Medical Work up   Patient Goals and CMS Choice Patient states their goals for this hospitalization and ongoing recovery are:: Legal guardian (sister) Derrick Marshall states to return to [place] in [email or web address].gov Compare Post Acute Care list provided to:: Legal Guardian (Legal guardian (sister) Derrick Marshall states  [facility]) Choice offered to / list presented to : Sibling      Expected Discharge Plan and Services In-house Referral: NA Discharge Planning Services: CM Consult Post Acute Care Choice:  (OP PT) Living arrangements for the past 2 months: Group Home                 DME Arranged:  (see note)         HH Arranged: NA HH Agency: NA        Prior Living Arrangements/Services Living arrangements for the past 2 months: Group Home   Patient language and need for interpreter reviewed:: Yes        Need for Family Participation in Patient Care: Yes (Comment) Care giver support system in place?: Yes  (comment)   Criminal Activity/Legal Involvement Pertinent to Current Situation/Hospitalization: No - Comment as needed  Activities of Daily Living   ADL Screening (condition at time of admission) Independently performs ADLs?: No Does the patient have a NEW difficulty with bathing/dressing/toileting/self-feeding that is expected to last >3 days?: Yes (Initiates electronic notice to provider for possible OT consult) Does the patient have a NEW difficulty with getting in/out of bed, walking, or climbing stairs that is expected to last >3 days?: Yes (Initiates electronic notice to provider for possible PT consult) Does the patient have a NEW difficulty with communication that is expected to last >3 days?: No Is the patient deaf or have difficulty hearing?: No Does the patient have difficulty seeing, even when wearing glasses/contacts?: No Does the patient have difficulty concentrating, remembering, or making decisions?: No  Permission Sought/Granted Permission sought to share information with : Family Supports Permission granted to share information with : Yes, Verbal Permission Granted  Share Information with NAME: Legal guardian (sister) Derrick Marshall  Permission granted to share info w AGENCY: OP PT at [employer] [facility]        Emotional Assessment Appearance:: Appears stated age     Orientation: : Oriented to Self   Psych Involvement: No (comment)  Admission diagnosis:  Cellulitis of neck [L03.221] Lymphadenopathy [R59.1] Patient Active Problem List   Diagnosis Date Noted  Cellulitis of neck 04/30/2024   Tetralogy of Fallot 04/30/2024   Dysphagia 04/30/2024   Paraphimosis    Sepsis (HCC) 01/21/2018   Autistic spectrum disorder 12/12/2017   Moderate intellectual disability 12/12/2017   Agitation 12/12/2017   PCP:  Fernand Fredy RAMAN, MD Pharmacy:   [facility] [address] ([address]), [place] - [address]) [place]  [address] Phone: [phone] Fax: [phone]  [facility] [address] [place] - [address] [place] [address] Phone: [phone] Fax: [phone]  [facility] [address] [place] [address] Phone: [phone] Fax: [phone]     Social Drivers of Health (SDOH) Social History: SDOH Screenings   Food Insecurity: No Food Insecurity (05/01/2024)  Housing: Low Risk (05/01/2024)  Transportation Needs: No Transportation Needs (05/01/2024)  Utilities: Not At Risk (05/01/2024)  Financial Resource Strain: Low Risk (04/11/2024)   Received from [facility]  Tobacco Use: Low Risk (04/30/2024)   SDOH Interventions:     Readmission Risk Interventions     No data to display

## 2024-05-02 NOTE — Progress Notes (Signed)
 Pt complaining of chest pain 5/10, no other symptoms noted pt denies SOB, pain in left arm, or feeling of impending doom. Patient A&O to self at baseline, pt did lie down after eating breakfast, family member at beside stating it could be his indigestion. MD made aware, EKG being completed by NT and will place results in chart once completed.

## 2024-05-07 ENCOUNTER — Inpatient Hospital Stay (HOSPITAL_COMMUNITY): Admit: 2024-05-07 | Discharge: 2024-05-07 | Disposition: A | Attending: Internal Medicine | Admitting: Internal Medicine

## 2024-05-07 DIAGNOSIS — R1310 Dysphagia, unspecified: Secondary | ICD-10-CM

## 2024-05-07 NOTE — Progress Notes (Signed)
 Modified Barium Swallow Study  Patient Details  Name: Derrick Marshall MRN: [id number] Date of Birth: 1981-04-08  Today's Date: 05/07/2024  HPI/PMH: HPI: Derrick Marshall is a 44 y.o. male who presents for a repeat OP MBS. Pt recently hospitalized with left-sided neck swelling and SOB. Soft tissue CT suggestive of cellulitis of neck. CXE 1/5 with edema/effusion. Patient was discharged from [facility] on Friday (04/27/2024) with ETT 12/17-21. Clinical swallow eval 12/23 with regular diet initation. Sister reports changes to swallowing since that admission, not just with new neck swelling. MBS on 1/6 showed Silent aspiration occured with thin liquids during the swallow and persistent penetration and suspected aspiration occuring after the swallow from pyriform residuals spilling over arytenoids and into laryngeal vestibule. Pt initiated puree/nectar but has been very unhappy with diet at home.  Pt with PMH of Down syndrome, recent admission for sepsis and acute hypoxic respiratory failure 2/2 CAP with extubation on 12/21, tetralogy of Fallot s/p repair (1984), new RV dysfunction, severe intellectual disability, Hx of depression, ASD, ADHD.   Clinical Impression: Clinical Impression: Pt demonstrates moderate oral dysphagia and mild pharyngeal dysphagia that appears improved from prior study. Posterior pharygneal wall edema appears improved from prior. Posture also improved. Pt still has incomplete mastication of solids and some pharyngeal resiude with solids, especially if not completely masticated. However, aspiration of liquids has fully resolved and pt can resume thin liquids. Advised family to continue offering soft moist solids, mashable with a fork, small bites IDDSI 6. Avoid choking hazard foods like grapes and hot dogs. Also suggest following bites with sips. Pt apparently has dry mouth and needs liquid wash to clear oral and pharyngeal residue.  Factors that may increase risk of adverse event in  presence of aspiration [name] & [name] 2021): Factors that may increase risk of adverse event in presence of aspiration [name] & [name] 2021): Reduced cognitive function; Dependence for feeding and/or oral hygiene   Recommendations/Plan: Swallowing Evaluation Recommendations Swallowing Evaluation Recommendations Recommendations: PO diet PO Diet Recommendation: Dysphagia 3 (Mechanical soft); Thin liquids (Level 0) Liquid Administration via: Cup; Straw Medication Administration: Whole meds with liquid Supervision: Full supervision/cueing for swallowing strategies Swallowing strategies  : Slow rate; Small bites/sips; Follow solids with liquids Postural changes: Position pt fully upright for meals Oral care recommendations: Oral care BID (2x/day)    Treatment Plan Treatment Plan Treatment recommendations: Defer treatment plan to SLP at other venue (see follow-up recommendations) Follow-up recommendations: Home health SLP     Recommendations Recommendations for follow up therapy are one component of a multi-disciplinary discharge planning process, led by the attending physician.  Recommendations may be updated based on patient status, additional functional criteria and insurance authorization.  Assessment: Orofacial Exam: Orofacial Exam Oral Cavity: Oral Hygiene: WFL    Anatomy:  Anatomy: Other (Comment) (posterior pharyngeal wall a little bit better than prior)   Boluses Administered: Boluses Administered Boluses Administered: Thin liquids (Level 0); Puree; Solid     Oral Impairment Domain: Oral Impairment Domain Lip Closure: No labial escape Tongue control during bolus hold: Not tested Bolus preparation/mastication: Minimal chewing/mashing with majority of bolus unchewed; Disorganized chewing/mashing with solid pieces of bolus unchewed Bolus transport/lingual motion: Repetitive/disorganized tongue motion Oral residue: Residue collection on oral structures Location  of oral residue : Lateral sulci Initiation of pharyngeal swallow : Pyriform sinuses     Pharyngeal Impairment Domain: Pharyngeal Impairment Domain Soft palate elevation: No bolus between soft palate (SP)/pharyngeal wall (PW) Laryngeal elevation: Complete superior movement of thyroid  cartilage  with complete approximation of arytenoids to epiglottic petiole Anterior hyoid excursion: Partial anterior movement Epiglottic movement: Complete inversion Laryngeal vestibule closure: Complete, no air/contrast in laryngeal vestibule Pharyngeal stripping wave : Present - complete Pharyngoesophageal segment opening: Partial distention/partial duration, partial obstruction of flow Tongue base retraction: No contrast between tongue base and posterior pharyngeal wall (PPW) Pharyngeal residue: Collection of residue within or on pharyngeal structures Location of pharyngeal residue: Valleculae; Pyriform sinuses     Esophageal Impairment Domain: Esophageal Impairment Domain Esophageal clearance upright position: -- (Pt would not swallow pill)    Pill: No data recorded  Penetration/Aspiration Scale Score: Penetration/Aspiration Scale Score 1.  Material does not enter airway: Thin liquids (Level 0); Puree; Solid 2.  Material enters airway, remains ABOVE vocal cords then ejected out: Thin liquids (Level 0)    Compensatory Strategies: No data recorded     General Information: Caregiver present: Yes   Diet Prior to this Study: Dysphagia 1 (pureed); Mildly thick liquids (Level 2, nectar thick)    Temperature : Normal    Respiratory Status: WFL    Supplemental O2: None (Room air)    History of Recent Intubation: No   Behavior/Cognition: Alert; Cooperative; Pleasant mood  Self-Feeding Abilities: Needs set-up for self-feeding  Baseline vocal quality/speech: Dysphonic  Volitional Cough: Unable to elicit  Volitional Swallow: Unable to elicit  Exam Limitations: No limitations   Goal  Planning: No data recorded No data recorded No data recorded No data recorded No data recorded  Pain: No data recorded  End of Session: Start Time:SLP Start Time (ACUTE ONLY): 1200  Stop Time: SLP Stop Time (ACUTE ONLY): 1225  Time Calculation:SLP Time Calculation (min) (ACUTE ONLY): 25 min  Charges: SLP Evaluations $ SLP Speech Visit: 1 Visit  SLP Evaluations $Outpatient MBS Swallow: 1 Procedure   SLP visit diagnosis: SLP Visit Diagnosis: Dysphagia, oropharyngeal phase (R13.12)    Past Medical History:  Past Medical History:  Diagnosis Date   Autism    Down syndrome    Tetralogy of Fallot    Past Surgical History:  Past Surgical History:  Procedure Laterality Date   TETRALOGY OF FALLOT REPAIR      Tyleigh Mahn, Consuelo Fitch 05/07/2024, 2:29 PM
